# Patient Record
Sex: Female | Born: 1985 | Race: White | Hispanic: No | Marital: Single | Smoking: Current some day smoker
Health system: Southern US, Community
[De-identification: ages and names within clinical notes are randomized; demographics above are authoritative.]

## PROBLEM LIST (undated history)

## (undated) DIAGNOSIS — E039 Hypothyroidism, unspecified: Secondary | ICD-10-CM

## (undated) DIAGNOSIS — M069 Rheumatoid arthritis, unspecified: Secondary | ICD-10-CM

## (undated) DIAGNOSIS — O30049 Twin pregnancy, dichorionic/diamniotic, unspecified trimester: Secondary | ICD-10-CM

## (undated) HISTORY — DX: Hypothyroidism, unspecified: E03.9

## (undated) HISTORY — DX: Rheumatoid arthritis, unspecified: M06.9

## (undated) HISTORY — DX: Twin pregnancy, dichorionic/diamniotic, unspecified trimester: O30.049

## (undated) HISTORY — PX: NO PAST SURGERIES: SHX2092

---

## 2009-12-05 ENCOUNTER — Inpatient Hospital Stay (HOSPITAL_COMMUNITY): Admission: AD | Admit: 2009-12-05 | Discharge: 2009-12-07 | Payer: Self-pay | Admitting: Obstetrics and Gynecology

## 2010-06-18 LAB — CBC
HCT: 36.7 % (ref 36.0–46.0)
MCHC: 34.3 g/dL (ref 30.0–36.0)
MCV: 95.9 fL (ref 78.0–100.0)
Platelets: 188 10*3/uL (ref 150–400)
Platelets: 217 10*3/uL (ref 150–400)
RDW: 13.2 % (ref 11.5–15.5)
RDW: 13.4 % (ref 11.5–15.5)
WBC: 10.2 10*3/uL (ref 4.0–10.5)

## 2010-06-18 LAB — RPR: RPR Ser Ql: NONREACTIVE

## 2010-09-08 ENCOUNTER — Institutional Professional Consult (permissible substitution): Payer: Self-pay | Admitting: Medical

## 2011-01-09 ENCOUNTER — Emergency Department (HOSPITAL_COMMUNITY)
Admission: EM | Admit: 2011-01-09 | Discharge: 2011-01-09 | Disposition: A | Discharge status: Home / Self Care | Payer: PRIVATE HEALTH INSURANCE | Attending: Emergency Medicine | Admitting: Emergency Medicine

## 2011-01-09 DIAGNOSIS — M25519 Pain in unspecified shoulder: Secondary | ICD-10-CM | POA: Insufficient documentation

## 2011-04-20 ENCOUNTER — Ambulatory Visit (HOSPITAL_COMMUNITY)
Admission: RE | Admit: 2011-04-20 | Discharge: 2011-04-20 | Disposition: A | Discharge status: Home / Self Care | Payer: PRIVATE HEALTH INSURANCE | Source: Ambulatory Visit | Attending: Rheumatology | Admitting: Rheumatology

## 2011-04-20 ENCOUNTER — Other Ambulatory Visit: Payer: Self-pay | Admitting: Rheumatology

## 2011-04-20 DIAGNOSIS — M069 Rheumatoid arthritis, unspecified: Secondary | ICD-10-CM | POA: Insufficient documentation

## 2011-04-20 DIAGNOSIS — Z01818 Encounter for other preprocedural examination: Secondary | ICD-10-CM | POA: Insufficient documentation

## 2011-04-20 DIAGNOSIS — Z87891 Personal history of nicotine dependence: Secondary | ICD-10-CM | POA: Insufficient documentation

## 2011-04-20 DIAGNOSIS — D849 Immunodeficiency, unspecified: Secondary | ICD-10-CM

## 2011-04-20 DIAGNOSIS — R0602 Shortness of breath: Secondary | ICD-10-CM | POA: Insufficient documentation

## 2011-12-10 ENCOUNTER — Emergency Department (HOSPITAL_BASED_OUTPATIENT_CLINIC_OR_DEPARTMENT_OTHER): Payer: PRIVATE HEALTH INSURANCE

## 2011-12-10 ENCOUNTER — Emergency Department (HOSPITAL_BASED_OUTPATIENT_CLINIC_OR_DEPARTMENT_OTHER)
Admission: EM | Admit: 2011-12-10 | Discharge: 2011-12-11 | Disposition: A | Discharge status: Home / Self Care | Payer: PRIVATE HEALTH INSURANCE | Attending: Emergency Medicine | Admitting: Emergency Medicine

## 2011-12-10 ENCOUNTER — Encounter (HOSPITAL_BASED_OUTPATIENT_CLINIC_OR_DEPARTMENT_OTHER): Payer: Self-pay | Admitting: *Deleted

## 2011-12-10 DIAGNOSIS — R109 Unspecified abdominal pain: Secondary | ICD-10-CM | POA: Insufficient documentation

## 2011-12-10 DIAGNOSIS — R51 Headache: Secondary | ICD-10-CM | POA: Insufficient documentation

## 2011-12-10 DIAGNOSIS — M549 Dorsalgia, unspecified: Secondary | ICD-10-CM | POA: Insufficient documentation

## 2011-12-10 DIAGNOSIS — R110 Nausea: Secondary | ICD-10-CM | POA: Insufficient documentation

## 2011-12-10 DIAGNOSIS — F17200 Nicotine dependence, unspecified, uncomplicated: Secondary | ICD-10-CM | POA: Insufficient documentation

## 2011-12-10 DIAGNOSIS — Z79899 Other long term (current) drug therapy: Secondary | ICD-10-CM | POA: Insufficient documentation

## 2011-12-10 LAB — URINALYSIS, ROUTINE W REFLEX MICROSCOPIC
Glucose, UA: NEGATIVE mg/dL
Ketones, ur: 15 mg/dL — AB
Nitrite: NEGATIVE
Protein, ur: NEGATIVE mg/dL
Specific Gravity, Urine: 1.031 — ABNORMAL HIGH (ref 1.005–1.030)
Urobilinogen, UA: 1 mg/dL (ref 0.0–1.0)
pH: 5.5 (ref 5.0–8.0)

## 2011-12-10 LAB — CBC WITH DIFFERENTIAL/PLATELET
Eosinophils Relative: 0 % (ref 0–5)
HCT: 41.5 % (ref 36.0–46.0)
Lymphocytes Relative: 12 % (ref 12–46)
Lymphs Abs: 0.9 10*3/uL (ref 0.7–4.0)
MCV: 89.6 fL (ref 78.0–100.0)
Monocytes Absolute: 0.4 10*3/uL (ref 0.1–1.0)
Monocytes Relative: 5 % (ref 3–12)
RBC: 4.63 MIL/uL (ref 3.87–5.11)
RDW: 12.7 % (ref 11.5–15.5)
WBC: 7.6 10*3/uL (ref 4.0–10.5)

## 2011-12-10 LAB — URINE MICROSCOPIC-ADD ON

## 2011-12-10 LAB — COMPREHENSIVE METABOLIC PANEL
BUN: 15 mg/dL (ref 6–23)
CO2: 26 mEq/L (ref 19–32)
Calcium: 9.3 mg/dL (ref 8.4–10.5)
Creatinine, Ser: 0.8 mg/dL (ref 0.50–1.10)
GFR calc Af Amer: >90 mL/min (ref >90)
GFR calc non Af Amer: >90 mL/min (ref >90)
Glucose, Bld: 95 mg/dL (ref 70–99)

## 2011-12-10 LAB — PREGNANCY, URINE: Preg Test, Ur: NEGATIVE

## 2011-12-10 MED ORDER — ONDANSETRON HCL 4 MG/2ML IJ SOLN
4.0000 mg | Freq: Once | INTRAMUSCULAR | Status: AC
  Administered 2011-12-10: 4 mg via INTRAVENOUS
  Filled 2011-12-10: qty 2

## 2011-12-10 MED ORDER — MORPHINE SULFATE 4 MG/ML IJ SOLN
4.0000 mg | Freq: Once | INTRAMUSCULAR | Status: AC
  Administered 2011-12-11: 4 mg via INTRAVENOUS
  Filled 2011-12-10: qty 1

## 2011-12-10 MED ORDER — IOHEXOL 300 MG/ML  SOLN
40.0000 mL | Freq: Once | INTRAMUSCULAR | Status: AC | PRN
  Administered 2011-12-10: 40 mL via ORAL

## 2011-12-10 MED ORDER — SODIUM CHLORIDE 0.9 % IV BOLUS (SEPSIS)
1000.0000 mL | Freq: Once | INTRAVENOUS | Status: AC
  Administered 2011-12-10: 1000 mL via INTRAVENOUS

## 2011-12-10 MED ORDER — MORPHINE SULFATE 4 MG/ML IJ SOLN
4.0000 mg | Freq: Once | INTRAMUSCULAR | Status: AC
  Administered 2011-12-10: 4 mg via INTRAVENOUS
  Filled 2011-12-10: qty 1

## 2011-12-10 NOTE — ED Notes (Signed)
Pt c/o right flank pain  X 2 days

## 2011-12-10 NOTE — ED Provider Notes (Signed)
History     CSN: [id number]  Arrival date & time 12/10/11  Lori Valentine   First MD Initiated Contact with Patient 12/10/11 2239      Chief Complaint  Patient presents with  . Flank Pain    (Consider location/radiation/quality/duration/timing/severity/associated sxs/prior treatment) Patient is a 26 y.o. female presenting with flank pain. The history is provided by the patient, a parent and medical records.  Flank Pain Associated symptoms include abdominal pain, headaches and nausea. Pertinent negatives include no chest pain, diaphoresis, fatigue, fever, neck pain, rash or vomiting.   Lori Valentine is a 26 y.o. female presents to the emergency department complaining of flank pain, back pain, abdominal pain.  The onset of the symptoms was  abrupt starting 1 day ago.  The patient has associated nausea.  The symptoms have been  persistent, gradually worsened.  Breathing, coughing, moving makes the symptoms worse and nothing makes symptoms better.  The patient denies fever, chills, headache, cough, dysuria.  Pt with Hx of RA, no unusual straining or lifting.  Pain began in the R flank and moved throughout the back and abd.  Pain worse in the epigastric area.    History reviewed. No pertinent past medical history.  History reviewed. No pertinent past surgical history.  History reviewed. No pertinent family history.  History  Substance Use Topics  . Smoking status: Current Some Day Smoker  . Smokeless tobacco: Not on file  . Alcohol Use: No    OB History    Grav Para Term Preterm Abortions TAB SAB Ect Mult Living                  Review of Systems  Constitutional: Negative for fever, diaphoresis, appetite change and fatigue.  HENT: Negative for trouble swallowing, neck pain and neck stiffness.   Respiratory: Negative for shortness of breath.   Cardiovascular: Negative for chest pain.  Gastrointestinal: Positive for nausea and abdominal pain. Negative for vomiting, diarrhea, constipation  and blood in stool.  Genitourinary: Positive for flank pain. Negative for dysuria, urgency, frequency, hematuria and difficulty urinating.  Musculoskeletal: Positive for back pain.  Skin: Negative for rash.  Neurological: Positive for headaches.  Psychiatric/Behavioral: The patient is not nervous/anxious.   All other systems reviewed and are negative.    Allergies  Review of patient's allergies indicates no known allergies.  Home Medications   Current Outpatient Rx  Name Route Sig Dispense Refill  . ACETAMINOPHEN 500 MG PO TABS Oral Take 1,000 mg by mouth every 6 (six) hours as needed. For pain.    Marland Kitchen ETANERCEPT 50 MG/ML [place] SOLN Subcutaneous Inject 50 mg into the skin once a week.    Marland Kitchen LEVONORGESTREL 20 MCG/24HR IU IUD Intrauterine 1 each by Intrauterine route once.    . METHOTREXATE 2.5 MG PO TABS Oral Take 2.5 mg by mouth once a week. Caution:Chemotherapy. Protect from light.    Marland Kitchen PREDNISOLONE SODIUM PHOSPHATE 15 MG PO TBDP Oral Take 15 mg by mouth daily.    . TRAMADOL HCL 50 MG PO TABS Oral Take 50 mg by mouth every 6 (six) hours as needed.      BP 124/85  Pulse 100  Temp 98.1 F (36.7 C) (Oral)  Resp 16  Ht 5\' 7"  (1.702 m)  Wt 235 lb (106.595 kg)  BMI 36.81 kg/m2  SpO2 100%  Physical Exam  Nursing note and vitals reviewed. Constitutional: She appears well-developed and well-nourished. No distress.  HENT:  Head: Normocephalic and atraumatic.  Mouth/Throat: Oropharynx is  clear and moist. No oropharyngeal exudate.  Eyes: Conjunctivae are normal. No scleral icterus.  Neck: Normal range of motion. Neck supple.  Cardiovascular: Normal rate, regular rhythm and intact distal pulses.   Pulmonary/Chest: Effort normal and breath sounds normal. No respiratory distress. She has no wheezes.  Abdominal: Soft. Bowel sounds are normal. She exhibits no mass. There is tenderness in the epigastric area and periumbilical area. There is guarding and CVA tenderness (R side). There is no  rebound.  Musculoskeletal: Normal range of motion. She exhibits no edema.  Neurological: She is alert. She exhibits normal muscle tone. Coordination normal.       Speech is clear and goal oriented Moves extremities without ataxia  Skin: Skin is warm and dry. No rash noted. She is not diaphoretic.  Psychiatric: She has a normal mood and affect.    ED Course  Procedures (including critical care time)  Labs Reviewed  URINALYSIS, ROUTINE W REFLEX MICROSCOPIC - Abnormal; Notable for the following:    Color, Urine AMBER (*)  BIOCHEMICALS MAY BE AFFECTED BY COLOR   Specific Gravity, Urine 1.031 (*)     Hgb urine dipstick MODERATE (*)     Bilirubin Urine SMALL (*)     Ketones, ur 15 (*)     Leukocytes, UA SMALL (*)     All other components within normal limits  URINE MICROSCOPIC-ADD ON - Abnormal; Notable for the following:    Squamous Epithelial / LPF FEW (*)     Bacteria, UA FEW (*)     All other components within normal limits  CBC WITH DIFFERENTIAL - Abnormal; Notable for the following:    Neutrophils Relative 83 (*)     All other components within normal limits  PREGNANCY, URINE  COMPREHENSIVE METABOLIC PANEL  LIPASE, BLOOD   No results found.  Results for orders placed during the hospital encounter of 12/10/11  PREGNANCY, URINE      Component Value Range   Preg Test, Ur NEGATIVE  NEGATIVE  URINALYSIS, ROUTINE W REFLEX MICROSCOPIC      Component Value Range   Color, Urine AMBER (*) YELLOW   APPearance CLEAR  CLEAR   Specific Gravity, Urine 1.031 (*) 1.005 - 1.030   pH 5.5  5.0 - 8.0   Glucose, UA NEGATIVE  NEGATIVE mg/dL   Hgb urine dipstick MODERATE (*) NEGATIVE   Bilirubin Urine SMALL (*) NEGATIVE   Ketones, ur 15 (*) NEGATIVE mg/dL   Protein, ur NEGATIVE  NEGATIVE mg/dL   Urobilinogen, UA 1.0  0.0 - 1.0 mg/dL   Nitrite NEGATIVE  NEGATIVE   Leukocytes, UA SMALL (*) NEGATIVE  URINE MICROSCOPIC-ADD ON      Component Value Range   Squamous Epithelial / LPF FEW (*)  RARE   WBC, UA 3-6  <3 WBC/hpf   RBC / HPF 7-10  <3 RBC/hpf   Bacteria, UA FEW (*) RARE  CBC WITH DIFFERENTIAL      Component Value Range   WBC 7.6  4.0 - 10.5 K/uL   RBC 4.63  3.87 - 5.11 MIL/uL   Hemoglobin 14.3  12.0 - 15.0 g/dL   HCT 16.1  09.6 - 04.5 %   MCV 89.6  78.0 - 100.0 fL   MCH 30.9  26.0 - 34.0 pg   MCHC 34.5  30.0 - 36.0 g/dL   RDW 40.9  81.1 - 91.4 %   Platelets 254  150 - 400 K/uL   Neutrophils Relative 83 (*) 43 - 77 %  Neutro Abs 6.3  1.7 - 7.7 K/uL   Lymphocytes Relative 12  12 - 46 %   Lymphs Abs 0.9  0.7 - 4.0 K/uL   Monocytes Relative 5  3 - 12 %   Monocytes Absolute 0.4  0.1 - 1.0 K/uL   Eosinophils Relative 0  0 - 5 %   Eosinophils Absolute 0.0  0.0 - 0.7 K/uL   Basophils Relative 0  0 - 1 %   Basophils Absolute 0.0  0.0 - 0.1 K/uL  COMPREHENSIVE METABOLIC PANEL      Component Value Range   Sodium 135  135 - 145 mEq/L   Potassium 3.5  3.5 - 5.1 mEq/L   Chloride 97  96 - 112 mEq/L   CO2 26  19 - 32 mEq/L   Glucose, Bld 95  70 - 99 mg/dL   BUN 15  6 - 23 mg/dL   Creatinine, Ser 1.61  0.50 - 1.10 mg/dL   Calcium 9.3  8.4 - 09.6 mg/dL   Total Protein 7.5  6.0 - 8.3 g/dL   Albumin 4.5  3.5 - 5.2 g/dL   AST 20  0 - 37 U/L   ALT 19  0 - 35 U/L   Alkaline Phosphatase 73  39 - 117 U/L   Total Bilirubin 0.6  0.3 - 1.2 mg/dL   GFR calc non Af Amer >90  >90 mL/min   GFR calc Af Amer >90  >90 mL/min  LIPASE, BLOOD      Component Value Range   Lipase 27  11 - 59 U/L   No results found.    1. Abdominal pain       MDM  Lori Valentine presents with right flank pain, epigastric and periumbilical pain.  Patient without a history of kidney stones.  Concern for kidney stone versus appendicitis versus pancreatitis.  CMP, CBC, lipase within normal limits.  UA appears contaminated with epithelial cells, but does have a moderate amount of hemoglobin and small amount leukocytes.  Patient is alert, nontoxic appearing, nonseptic appearing, afebrile.  Pain  medication and fluids given in the emergency department. Adequate pain control achieved.  Abdominal CT exam pending.  I discussed this patient with Dr. Read Drivers and he will assume care.          Dahlia Client Devaney Segers, PA-C 12/11/11 1432

## 2011-12-10 NOTE — ED Notes (Signed)
Pt c/o stabbing pains in her back, started this am, pain has gotten worse as the day as progressed reports chest pressure to left side of chest with sob,

## 2011-12-11 MED ORDER — IOHEXOL 300 MG/ML  SOLN
100.0000 mL | Freq: Once | INTRAMUSCULAR | Status: AC | PRN
  Administered 2011-12-11: 100 mL via INTRAVENOUS

## 2011-12-11 MED ORDER — HYDROCODONE-ACETAMINOPHEN 5-325 MG PO TABS: 1.0000 | ORAL_TABLET | Freq: Four times a day (QID) | ORAL | Status: DC | PRN

## 2011-12-11 NOTE — ED Provider Notes (Signed)
Nursing notes and vitals signs, including pulse oximetry, reviewed.  Summary of this visit's results, reviewed by myself:  Labs:  Results for orders placed during the hospital encounter of 12/10/11  PREGNANCY, URINE      Component Value Range   Preg Test, Ur NEGATIVE  NEGATIVE  URINALYSIS, ROUTINE W REFLEX MICROSCOPIC      Component Value Range   Color, Urine AMBER (*) YELLOW   APPearance CLEAR  CLEAR   Specific Gravity, Urine 1.031 (*) 1.005 - 1.030   pH 5.5  5.0 - 8.0   Glucose, UA NEGATIVE  NEGATIVE mg/dL   Hgb urine dipstick MODERATE (*) NEGATIVE   Bilirubin Urine SMALL (*) NEGATIVE   Ketones, ur 15 (*) NEGATIVE mg/dL   Protein, ur NEGATIVE  NEGATIVE mg/dL   Urobilinogen, UA 1.0  0.0 - 1.0 mg/dL   Nitrite NEGATIVE  NEGATIVE   Leukocytes, UA SMALL (*) NEGATIVE  URINE MICROSCOPIC-ADD ON      Component Value Range   Squamous Epithelial / LPF FEW (*) RARE   WBC, UA 3-6  <3 WBC/hpf   RBC / HPF 7-10  <3 RBC/hpf   Bacteria, UA FEW (*) RARE  CBC WITH DIFFERENTIAL      Component Value Range   WBC 7.6  4.0 - 10.5 K/uL   RBC 4.63  3.87 - 5.11 MIL/uL   Hemoglobin 14.3  12.0 - 15.0 g/dL   HCT 13.0  86.5 - 78.4 %   MCV 89.6  78.0 - 100.0 fL   MCH 30.9  26.0 - 34.0 pg   MCHC 34.5  30.0 - 36.0 g/dL   RDW 69.6  29.5 - 28.4 %   Platelets 254  150 - 400 K/uL   Neutrophils Relative 83 (*) 43 - 77 %   Neutro Abs 6.3  1.7 - 7.7 K/uL   Lymphocytes Relative 12  12 - 46 %   Lymphs Abs 0.9  0.7 - 4.0 K/uL   Monocytes Relative 5  3 - 12 %   Monocytes Absolute 0.4  0.1 - 1.0 K/uL   Eosinophils Relative 0  0 - 5 %   Eosinophils Absolute 0.0  0.0 - 0.7 K/uL   Basophils Relative 0  0 - 1 %   Basophils Absolute 0.0  0.0 - 0.1 K/uL  COMPREHENSIVE METABOLIC PANEL      Component Value Range   Sodium 135  135 - 145 mEq/L   Potassium 3.5  3.5 - 5.1 mEq/L   Chloride 97  96 - 112 mEq/L   CO2 26  19 - 32 mEq/L   Glucose, Bld 95  70 - 99 mg/dL   BUN 15  6 - 23 mg/dL   Creatinine, Ser 1.32  0.50  - 1.10 mg/dL   Calcium 9.3  8.4 - 44.0 mg/dL   Total Protein 7.5  6.0 - 8.3 g/dL   Albumin 4.5  3.5 - 5.2 g/dL   AST 20  0 - 37 U/L   ALT 19  0 - 35 U/L   Alkaline Phosphatase 73  39 - 117 U/L   Total Bilirubin 0.6  0.3 - 1.2 mg/dL   GFR calc non Af Amer >90  >90 mL/min   GFR calc Af Amer >90  >90 mL/min  LIPASE, BLOOD      Component Value Range   Lipase 27  11 - 59 U/L    Imaging Studies: Dg Chest 2 View  12/11/2011  *RADIOLOGY REPORT*  Clinical Data: Left-sided chest and  back pain.  Shortness of breath.  CHEST - 2 VIEW  Comparison:  04/10/2011  Findings:  The heart size and mediastinal contours are within normal limits.  Both lungs are clear.  The visualized skeletal structures are unremarkable.  IMPRESSION: No active cardiopulmonary disease.   Original Report Authenticated By: Danae Orleans, M.D.    Ct Abdomen Pelvis W Contrast  12/11/2011  *RADIOLOGY REPORT*  Clinical Data: Right flank and lower quadrant pain.  CT ABDOMEN AND PELVIS WITH CONTRAST  Technique:  Multidetector CT imaging of the abdomen and pelvis was performed following the standard protocol during bolus administration of intravenous contrast.  Contrast: OMNIPAQUE IOHEXOL 300 MG/ML  SOLN  Comparison: None.  Findings: The abdominal parenchymal organs are normal in appearance.  Gallbladder is unremarkable.  No evidence of hydronephrosis. No evidence of urolithiasis.  No soft tissue masses or lymphadenopathy identified within the abdomen or pelvis.  IUD is seen with the expected location in the uterus.  Adnexa are unremarkable.  Normal appendix is visualized.  No evidence of inflammatory process or abnormal fluid collections.  No evidence of dilated bowel loops.  IMPRESSION: Negative.  No acute findings or other significant abnormality identified.   Original Report Authenticated By: Danae Orleans, M.D.    Medical screening examination/treatment/procedure(s) were conducted as a shared visit with non-physician practitioner(s)  and myself.  I personally evaluated the patient during the encounter  1:00 AM Pain is improved. Some left upper quadrant tenderness remains. Abdomen is soft. Patient was advised to return if worsening and she is on immunosuppressive drugs.  Hanley Seamen, MD 12/11/11 [id number]

## 2011-12-13 ENCOUNTER — Encounter (HOSPITAL_COMMUNITY): Payer: Self-pay | Admitting: Family Medicine

## 2011-12-13 ENCOUNTER — Emergency Department (HOSPITAL_COMMUNITY)
Admission: EM | Admit: 2011-12-13 | Discharge: 2011-12-14 | Disposition: A | Discharge status: Home / Self Care | Payer: PRIVATE HEALTH INSURANCE | Attending: Emergency Medicine | Admitting: Emergency Medicine

## 2011-12-13 ENCOUNTER — Emergency Department (HOSPITAL_COMMUNITY): Payer: PRIVATE HEALTH INSURANCE

## 2011-12-13 DIAGNOSIS — M7918 Myalgia, other site: Secondary | ICD-10-CM

## 2011-12-13 DIAGNOSIS — R079 Chest pain, unspecified: Secondary | ICD-10-CM | POA: Insufficient documentation

## 2011-12-13 DIAGNOSIS — M549 Dorsalgia, unspecified: Secondary | ICD-10-CM | POA: Insufficient documentation

## 2011-12-13 DIAGNOSIS — R0602 Shortness of breath: Secondary | ICD-10-CM | POA: Insufficient documentation

## 2011-12-13 DIAGNOSIS — R791 Abnormal coagulation profile: Secondary | ICD-10-CM | POA: Insufficient documentation

## 2011-12-13 DIAGNOSIS — Z79899 Other long term (current) drug therapy: Secondary | ICD-10-CM | POA: Insufficient documentation

## 2011-12-13 DIAGNOSIS — F17200 Nicotine dependence, unspecified, uncomplicated: Secondary | ICD-10-CM | POA: Insufficient documentation

## 2011-12-13 DIAGNOSIS — R109 Unspecified abdominal pain: Secondary | ICD-10-CM | POA: Insufficient documentation

## 2011-12-13 DIAGNOSIS — IMO0001 Reserved for inherently not codable concepts without codable children: Secondary | ICD-10-CM | POA: Insufficient documentation

## 2011-12-13 LAB — POCT I-STAT, CHEM 8
BUN: 8 mg/dL (ref 6–23)
Calcium, Ion: 1.17 mmol/L (ref 1.12–1.23)
Chloride: 106 mEq/L (ref 96–112)
Creatinine, Ser: 0.7 mg/dL (ref 0.50–1.10)
Glucose, Bld: 126 mg/dL — ABNORMAL HIGH (ref 70–99)
TCO2: 22 mmol/L (ref 0–100)

## 2011-12-13 LAB — D-DIMER, QUANTITATIVE: D-Dimer, Quant: 2.76 ug/mL-FEU — ABNORMAL HIGH (ref 0.00–0.48)

## 2011-12-13 MED ORDER — MORPHINE SULFATE 4 MG/ML IJ SOLN
4.0000 mg | Freq: Once | INTRAMUSCULAR | Status: AC
  Administered 2011-12-13: 4 mg via INTRAVENOUS
  Filled 2011-12-13: qty 1

## 2011-12-13 MED ORDER — METHOCARBAMOL 500 MG PO TABS: 500.0000 mg | ORAL_TABLET | Freq: Two times a day (BID) | ORAL | Status: AC

## 2011-12-13 MED ORDER — IOHEXOL 350 MG/ML SOLN
100.0000 mL | Freq: Once | INTRAVENOUS | Status: AC | PRN
  Administered 2011-12-13: 100 mL via INTRAVENOUS

## 2011-12-13 MED ORDER — IBUPROFEN 400 MG PO TABS: 800.0000 mg | ORAL_TABLET | Freq: Once | ORAL | Status: DC

## 2011-12-13 MED ORDER — METHOCARBAMOL 500 MG PO TABS
1000.0000 mg | ORAL_TABLET | Freq: Once | ORAL | Status: AC
  Administered 2011-12-13: 1000 mg via ORAL
  Filled 2011-12-13: qty 2

## 2011-12-13 NOTE — ED Provider Notes (Signed)
History    This chart was scribed for Loren Racer, MD, MD by Smitty Pluck. The patient was seen in room TR05C and the patient's care was started at 9:03AM.   CSN: [id number]  Arrival date & time 12/13/11  0855   None     Chief Complaint  Patient presents with  . Back Pain    (Consider location/radiation/quality/duration/timing/severity/associated sxs/prior treatment) Patient is a 26 y.o. female presenting with back pain. The history is provided by the patient. No language interpreter was used.  Back Pain  Pertinent negatives include no chest pain, no fever, no numbness, no headaches, no abdominal pain, no dysuria and no weakness.   Lori Valentine is a 26 y.o. female who presents to the Emergency Department complaining of ongoing moderate right flank pain onset 3 days ago. Pt reports carrying a basket of laundry on Thursday. Reports SOB is only associated with pain. Pt [facility] days ago with normal CT abdomen pelvis, CXR, labs, UA, neg preg and was prescribed Vicodin without relief. Pt has hx of RA and takes methotrexate and prednisone. Pain improved yesterday and worsened today. No fever, chills, cough, chest pain, abd pain, N/V/D, urinary symptoms. Pain is worse with movement, deep breathing and talking.   History reviewed. No pertinent past medical history.  History reviewed. No pertinent past surgical history.  No family history on file.  History  Substance Use Topics  . Smoking status: Current Some Day Smoker  . Smokeless tobacco: Not on file  . Alcohol Use: No    OB History    Grav Para Term Preterm Abortions TAB SAB Ect Mult Living                  Review of Systems  Constitutional: Negative for fever and chills.  Respiratory: Positive for shortness of breath. Negative for cough, chest tightness and wheezing.   Cardiovascular: Negative for chest pain, palpitations and leg swelling.  Gastrointestinal: Negative for nausea, vomiting, abdominal pain and  diarrhea.  Genitourinary: Positive for flank pain. Negative for dysuria, frequency and hematuria.  Musculoskeletal: Positive for myalgias and back pain. Negative for joint swelling, arthralgias and gait problem.  Skin: Negative for rash and wound.  Neurological: Negative for dizziness, weakness, light-headedness, numbness and headaches.    Allergies  Review of patient's allergies indicates no known allergies.  Home Medications   Current Outpatient Rx  Name Route Sig Dispense Refill  . ACETAMINOPHEN 500 MG PO TABS Oral Take 1,000 mg by mouth every 6 (six) hours as needed. For pain.    Marland Kitchen ETANERCEPT 50 MG/ML [place] SOLN Subcutaneous Inject 50 mg into the skin once a week.    Marland Kitchen FOLIC ACID 1 MG PO TABS Oral Take 1 mg by mouth daily.    Marland Kitchen HYDROCODONE-ACETAMINOPHEN 5-325 MG PO TABS Oral Take 1-2 tablets by mouth every 6 (six) hours as needed. pain    . LEVOTHYROXINE SODIUM 75 MCG PO TABS Oral Take 75 mcg by mouth daily.    Marland Kitchen METHOTREXATE 2.5 MG PO TABS Oral Take 15 mg by mouth once a week. Caution:Chemotherapy. Protect from light.    Marland Kitchen PREDNISOLONE SODIUM PHOSPHATE 15 MG PO TBDP Oral Take 15 mg by mouth daily.    Marland Kitchen LEVONORGESTREL 20 MCG/24HR IU IUD Intrauterine 1 each by Intrauterine route once.    . METHOCARBAMOL 500 MG PO TABS Oral Take 1 tablet (500 mg total) by mouth 2 (two) times daily. 20 tablet 0    BP 114/98  Pulse 110  Temp 98.4 F (36.9 C)  Resp 20  SpO2 98%  Physical Exam  Nursing note and vitals reviewed. Constitutional: She is oriented to person, place, and time. She appears well-developed and well-nourished. No distress.       Walks into room with little difficulty and began crying during questioning.   HENT:  Head: Normocephalic and atraumatic.  Mouth/Throat: Oropharynx is clear and moist.  Eyes: EOM are normal. Pupils are equal, round, and reactive to light.  Neck: Normal range of motion. Neck supple.  Cardiovascular: Normal rate, regular rhythm and normal heart sounds.    Pulmonary/Chest: Effort normal and breath sounds normal. No respiratory distress. She has no wheezes. She has no rales. She exhibits no tenderness.  Abdominal: Soft. Bowel sounds are normal. There is no tenderness. There is no rebound and no guarding.       Pain is pinpointed to right flank and partially reproduced with palpation  Musculoskeletal: Normal range of motion. She exhibits tenderness. She exhibits no edema.       No calf tenderness or swelling  Ambulating without difficulty but became tearful   Mild TTP over R low thoracic back. Pin point tenderness.   Neurological: She is alert and oriented to person, place, and time.       5/5 motor, sensation intact  Skin: Skin is warm and dry. No rash noted. No erythema.  Psychiatric:       Tearful    ED Course  Procedures (including critical care time) DIAGNOSTIC STUDIES: Oxygen Saturation is 98% on room air, normal by my interpretation.    COORDINATION OF CARE: 9:17 AM Discussed pt ED treatment with pt   9:20 AM Ordered:   Medications  folic acid (FOLVITE) 1 MG tablet (not administered)  levothyroxine (SYNTHROID, LEVOTHROID) 75 MCG tablet (not administered)  HYDROcodone-acetaminophen (NORCO/VICODIN) 5-325 MG per tablet (not administered)  methocarbamol (ROBAXIN) 500 MG tablet (not administered)  methocarbamol (ROBAXIN) tablet 1,000 mg (1000 mg Oral Given 12/13/11 0938)  morphine 4 MG/ML injection 4 mg (4 mg Intravenous Given 12/13/11 1129)  iohexol (OMNIPAQUE) 350 MG/ML injection 100 mL (100 mL Intravenous Contrast Given 12/13/11 1210)       Labs Reviewed  D-DIMER, QUANTITATIVE - Abnormal; Notable for the following:    D-Dimer, Quant 2.76 (*)     All other components within normal limits  POCT I-STAT, CHEM 8 - Abnormal; Notable for the following:    Glucose, Bld 126 (*)     Hemoglobin 15.6 (*)     All other components within normal limits   Ct Angio Chest W/cm &/or Wo Cm  12/13/2011  *RADIOLOGY REPORT*  Clinical Data: Right  chest and back pain.  Elevated D-dimer.  CT ANGIOGRAPHY CHEST  Technique:  Multidetector CT imaging of the chest using the standard protocol during bolus administration of intravenous contrast. Multiplanar reconstructed images including MIPs were obtained and reviewed to evaluate the vascular anatomy.  Contrast: OMNIPAQUE IOHEXOL 350 MG/ML SOLN  Comparison: Chest x-ray 12/10/2011  Findings: No filling defects in the pulmonary arteries to suggest pulmonary emboli.  Plate-like and linear densities are noted dependently in the lower lobes bilaterally compatible with atelectasis.  No pleural effusions.  Heart is normal size.  Aorta is normal caliber. No mediastinal, hilar, or axillary adenopathy.  No acute bony abnormality. Imaging into the upper abdomen shows no acute findings.  IMPRESSION: Dependent atelectasis in the lungs bilaterally.  No visible pulmonary embolus.   Original Report Authenticated By: Cyndie Chime,  M.D.      1. Musculoskeletal pain       MDM  I personally performed the services described in this documentation, which was scribed in my presence. The recorded information has been reviewed and considered.  Reviewed pt's prev testing and CT. No source for pt symptoms found. Normal GB, lower lung, and kidneys without stones. Will screen for PE though I believe this is unlikely and symptoms are more likely musculoskeletal or pleurisy       Loren Racer, MD 12/13/11 1258

## 2011-12-13 NOTE — ED Notes (Signed)
Pt complaining of upper back pain sharp, stabbing and hurts to take a deep breath. Pt crying.

## 2011-12-13 NOTE — ED Notes (Addendum)
ED tech went to undress pt and have wanded in error, pt and family at bedside became very upset. Explained it was in error and wrong room number was told to tech.  Pt assisted back on stretcher and clothing not removed.

## 2011-12-13 NOTE — ED Notes (Signed)
Pt here for right flank pain. Onset last week, seen for same at [facility]. sts got better then pain returned, pt complains of pain with deep breath after talking, and when she is upset.

## 2011-12-13 NOTE — ED Notes (Signed)
Pt family is upset with MD sts that he was rude on assessment, family then sts that " he will receive a mouthful and that Reinaldo Berber would receive a phone call regarding same" Dr Ranae Palms went back into room with this rn and explained that he was just being direct at trying to get symptoms from pt and that he needed more than for " to just hand me the paper and say this is whats wrong" MD explained that he is testing her for furthur problems and rechecking also compared to what was done at [facility].

## 2013-03-16 ENCOUNTER — Other Ambulatory Visit: Payer: Self-pay | Admitting: Obstetrics & Gynecology

## 2013-03-16 ENCOUNTER — Other Ambulatory Visit (HOSPITAL_COMMUNITY)
Admission: RE | Admit: 2013-03-16 | Discharge: 2013-03-16 | Disposition: A | Discharge status: Home / Self Care | Payer: PRIVATE HEALTH INSURANCE | Source: Ambulatory Visit | Attending: Obstetrics & Gynecology | Admitting: Obstetrics & Gynecology

## 2013-03-16 DIAGNOSIS — N760 Acute vaginitis: Secondary | ICD-10-CM | POA: Insufficient documentation

## 2013-03-16 DIAGNOSIS — Z01419 Encounter for gynecological examination (general) (routine) without abnormal findings: Secondary | ICD-10-CM | POA: Insufficient documentation

## 2014-07-25 ENCOUNTER — Telehealth: Payer: Self-pay | Admitting: *Deleted

## 2014-07-25 NOTE — Telephone Encounter (Signed)
Unable to reach patient at time of Pre-Visit Call.  Left message for patient to return call when available.    

## 2014-07-29 ENCOUNTER — Encounter: Payer: Self-pay | Admitting: Family

## 2014-07-29 ENCOUNTER — Ambulatory Visit (INDEPENDENT_AMBULATORY_CARE_PROVIDER_SITE_OTHER): Payer: Managed Care, Other (non HMO) | Admitting: Family

## 2014-07-29 VITALS — BP 128/78 | HR 84 | Temp 98.1°F | Resp 14 | Ht 68.0 in | Wt 252.2 lb

## 2014-07-29 DIAGNOSIS — F418 Other specified anxiety disorders: Secondary | ICD-10-CM

## 2014-07-29 DIAGNOSIS — F32A Depression, unspecified: Secondary | ICD-10-CM | POA: Insufficient documentation

## 2014-07-29 DIAGNOSIS — F419 Anxiety disorder, unspecified: Secondary | ICD-10-CM

## 2014-07-29 DIAGNOSIS — E039 Hypothyroidism, unspecified: Secondary | ICD-10-CM | POA: Insufficient documentation

## 2014-07-29 DIAGNOSIS — F329 Major depressive disorder, single episode, unspecified: Secondary | ICD-10-CM

## 2014-07-29 LAB — TSH: TSH: 37.2 u[IU]/mL — ABNORMAL HIGH (ref 0.35–4.50)

## 2014-07-29 LAB — T3, FREE: T3, Free: 2.9 pg/mL (ref 2.3–4.2)

## 2014-07-29 LAB — T4, FREE: FREE T4: 0.5 ng/dL — AB (ref 0.60–1.60)

## 2014-07-29 MED ORDER — VENLAFAXINE HCL ER 37.5 MG PO CP24: ORAL_CAPSULE | ORAL | Status: DC

## 2014-07-29 MED ORDER — LEVOTHYROXINE SODIUM 75 MCG PO TABS: 75.0000 ug | ORAL_TABLET | Freq: Every day | ORAL | Status: DC

## 2014-07-29 NOTE — Progress Notes (Signed)
Subjective:    Patient ID: Lori Valentine, female    DOB: [birth year], 29 y.o.   MRN: [id number]  HPI  [name] is a 29 yr old female who presents today to establish care.  1) RA- maintained on Embrel, methotrexate. Reports taht she was diagnosed at 40.  Reports that she does not want to take injections. Dreads the injections.  She sees [name].  Has an appointment with another rheumatologist in [place].    Sadness- x 6 months.  Easily tearful, trouble handling things.    2) Hypothyroid- Has not been taking her medication.  Notes some trouble focusing, gets easily overwhelmed.  Can't follow through.    Review of Systems    see HPI  Past Medical History  Diagnosis Date  . Rheumatoid arthritis   . Hypothyroid     History   Social History  . Marital Status: Single    Spouse Name: N/A  . Number of Children: N/A  . Years of Education: N/A   Occupational History  . Not on file.   Social History Main Topics  . Smoking status: Current Every Day Smoker -- 10 years  . Smokeless tobacco: Not on file     Comment: 10 cigarettes daily  . Alcohol Use: 0.0 oz/week    0 Standard drinks or equivalent per week     Comment: rarely  . Drug Use: No  . Sexual Activity: Yes    Birth Control/ Protection:    Other Topics Concern  . Not on file   Social History Narrative   41 year old son   Lives with  Sister and nephew and son.   Works as Sales executive at [employer]       History reviewed. No pertinent past surgical history.  Family History  Problem Relation Age of Onset  . Schizophrenia Mother   . Bipolar disorder Mother   . Diabetes Other   . Heart disease Other     No Known Allergies  Current Outpatient Prescriptions on File Prior to Visit  Medication Sig Dispense Refill  . acetaminophen (TYLENOL) 500 MG tablet Take 1,000 mg by mouth every 6 (six) hours as needed. For pain.    Marland Kitchen etanercept (ENBREL) 50 MG/ML injection Inject 50 mg into the skin once a week.      . folic acid (FOLVITE) 1 MG tablet Take 2 mg by mouth daily.     Marland Kitchen levonorgestrel (MIRENA) 20 MCG/24HR IUD 1 each by Intrauterine route once.     No current facility-administered medications on file prior to visit.    BP 128/78 mmHg  Pulse 84  Temp(Src) 98.1 F (36.7 C) (Oral)  Resp 14  Ht 5\' 8"  (1.727 m)  Wt 252 lb 3.2 oz (114.397 kg)  BMI 38.36 kg/m2  SpO2 99%  LMP 07/29/2014    Objective:   Physical Exam  Constitutional: She is oriented to person, place, and time. She appears well-developed and well-nourished. No distress.  Cardiovascular: Normal rate and regular rhythm.   No murmur heard. Pulmonary/Chest: Effort normal and breath sounds normal. No respiratory distress. She has no wheezes. She has no rales. She exhibits no tenderness.  Neurological: She is alert and oriented to person, place, and time.  Psychiatric: Her behavior is normal. Judgment and thought content normal.  Tearful through much of interview          Assessment & Plan:  20 minutes spent with pt today. >50% of this time was spent counseling pt  on anxiety/depression.

## 2014-07-29 NOTE — Assessment & Plan Note (Signed)
Untreated, obtain follow up tfts.  Resume synthroid.

## 2014-07-29 NOTE — Patient Instructions (Signed)
Please complete lab work prior to leaving. Restart synthroid. Start effexor xr one tab once daily for 3 days, then increase to 2 tabs once daily. Follow up in 1 month. Welcome to [facility]!

## 2014-07-29 NOTE — Progress Notes (Signed)
Pre visit review using our clinic review tool, if applicable. No additional management support is needed unless otherwise documented below in the visit note. 

## 2014-07-29 NOTE — Assessment & Plan Note (Signed)
New. She is concerned about weight gain, so will try effexor rather than SSRI.  Advised pt to follow up in 1 month.

## 2014-07-30 ENCOUNTER — Encounter: Payer: Self-pay | Admitting: Family

## 2014-07-31 ENCOUNTER — Telehealth: Payer: Self-pay | Admitting: Family

## 2014-07-31 NOTE — Telephone Encounter (Signed)
Relation to pt: self  Call back number: work # [phone]    Reason for call:  Pt returning your call regarding lab results, pt states whomever answers the phone please let them know  its the doctor office.

## 2014-07-31 NOTE — Telephone Encounter (Signed)
Notified pt and she voices understanding. 

## 2014-08-07 ENCOUNTER — Telehealth: Payer: Self-pay | Admitting: Family

## 2014-08-07 NOTE — Telephone Encounter (Signed)
12-20% of pts can experience drowsiness on effexor. I would recommend that she continue as tolerated at the 37.5mg  dose. Don't increase to 2 tabs yet. Hopefully symptoms will improve over the next few weeks.

## 2014-08-07 NOTE — Telephone Encounter (Signed)
Caller name: Persephone Relation to pt: self Call back number: [phone] Pharmacy:  Reason for call:   Patient states that she feels extremely exhausted and wants to know if that is a side effect of effexor?

## 2014-08-09 NOTE — Telephone Encounter (Signed)
Left message on pt's voicemail to check her mychart message as response was sent to her via mychart.

## 2014-08-30 ENCOUNTER — Ambulatory Visit: Payer: PRIVATE HEALTH INSURANCE | Admitting: Family

## 2014-08-30 ENCOUNTER — Encounter: Payer: Self-pay | Admitting: Family

## 2014-08-30 DIAGNOSIS — Z0289 Encounter for other administrative examinations: Secondary | ICD-10-CM

## 2014-08-30 NOTE — Telephone Encounter (Signed)
Melissa-- please advise if ok to waive the late cancellation fee?

## 2014-09-03 ENCOUNTER — Ambulatory Visit: Payer: PRIVATE HEALTH INSURANCE | Admitting: Family

## 2014-09-03 DIAGNOSIS — Z0289 Encounter for other administrative examinations: Secondary | ICD-10-CM

## 2014-09-06 ENCOUNTER — Telehealth: Payer: Self-pay | Admitting: Family

## 2014-09-06 ENCOUNTER — Encounter: Payer: Self-pay | Admitting: Family

## 2014-09-06 NOTE — Telephone Encounter (Signed)
Pt was no show for appt on 08/30/14- letter sent. Charge?

## 2014-09-06 NOTE — Telephone Encounter (Signed)
Yes please

## 2014-09-10 ENCOUNTER — Encounter: Payer: Self-pay | Admitting: Family

## 2014-09-10 ENCOUNTER — Telehealth: Payer: Self-pay | Admitting: Family

## 2014-09-10 NOTE — Telephone Encounter (Signed)
Yes please

## 2014-09-10 NOTE — Telephone Encounter (Signed)
Pt was no show for appointment on 5/31- was also no show for appointment on 5/27. Letter sent- charge?

## 2014-09-13 NOTE — Telephone Encounter (Signed)
Mary- see below. Please remove charge. Thanks.

## 2014-09-13 NOTE — Telephone Encounter (Signed)
Lori Valentine-- It appears that pt may not have been aware of appt on 09/03/14.  See 08/30/14 patient email.  I received system message that 08/30/14 mychart response was not read. Do we still apply no show charge for 09/03/14?

## 2015-05-12 ENCOUNTER — Telehealth: Payer: Self-pay | Admitting: Family

## 2015-05-12 NOTE — Telephone Encounter (Signed)
Noted  

## 2015-05-12 NOTE — Telephone Encounter (Signed)
Pt called in because she is experiencing some swelling in her legs . I offered to schedule pt's appt for in the morning w/PCP, but isn't sure if waiting is okay so I transferred pt to [facility] to be advised further before scheduling.

## 2015-05-12 NOTE — Telephone Encounter (Signed)
[facility] - Client TELEPHONE ADVICE RECORD [facility]  Patient Name: Lori Valentine  DOB: 1985-08-29    Initial Comment Caller states they are having swelling in legs/feet   Nurse Assessment  Nurse: Leone Haven, RN, Susie Date/Time (Eastern Time): 05/12/2015 3:58:00 PM  Confirm and document reason for call. If symptomatic, describe symptoms. You must click the next button to save text entered. ---Caller states she is having swelling in legs and feet; she is an RA; RA MD told her that it may not be related to her RA and needs to have it evaluated; bilateral legs - from knees downward; denies numbness and tingling;  Has the patient traveled out of the country within the last 30 days? ---Not Applicable  Does the patient have any new or worsening symptoms? ---Yes  Will a triage be completed? ---Yes  Related visit to physician within the last 2 weeks? ---No  Does the PT have any chronic conditions? (i.e. diabetes, asthma, etc.) ---Yes  List chronic conditions. ---RA - Hypothyroidism  Did the patient indicate they were pregnant? ---No  Is this a behavioral health or substance abuse call? ---No     Guidelines    Guideline Title Affirmed Question Affirmed Notes  Ankle Swelling [1] Thigh, calf, or ankle swelling AND [2] bilateral AND [3] 1 side is more swollen    Final Disposition User   See Physician within 4 Hours (or PCP triage) Wisdom, RN, Susie    Comments  Please call; [phone]  patient states that areas of edema on ankles are hard to the touch and some areas are softer; she did state that she has 'bumps' located behind her knees; when she removed her loosely fitting house shoe - she stated her foot was purple in color; referred to 4 hour outcome with preference for the ED;   Referrals  GO TO FACILITY OTHER - SPECIFY   Disagree/Comply: Comply

## 2015-05-12 NOTE — Telephone Encounter (Signed)
Appt with Efraim Kaufmann was scheduled for tomorrow at 05/13/15 at 7 am.  Message routed to PCP for review and recommendations.

## 2015-05-13 ENCOUNTER — Ambulatory Visit (INDEPENDENT_AMBULATORY_CARE_PROVIDER_SITE_OTHER): Payer: Managed Care, Other (non HMO) | Admitting: Family

## 2015-05-13 ENCOUNTER — Encounter: Payer: Self-pay | Admitting: Family

## 2015-05-13 VITALS — BP 125/69 | HR 94 | Temp 97.9°F | Resp 18 | Ht 68.0 in | Wt 255.6 lb

## 2015-05-13 DIAGNOSIS — Z23 Encounter for immunization: Secondary | ICD-10-CM | POA: Diagnosis not present

## 2015-05-13 DIAGNOSIS — R41840 Attention and concentration deficit: Secondary | ICD-10-CM | POA: Diagnosis not present

## 2015-05-13 DIAGNOSIS — R600 Localized edema: Secondary | ICD-10-CM

## 2015-05-13 DIAGNOSIS — F419 Anxiety disorder, unspecified: Secondary | ICD-10-CM

## 2015-05-13 DIAGNOSIS — F329 Major depressive disorder, single episode, unspecified: Secondary | ICD-10-CM

## 2015-05-13 DIAGNOSIS — F32A Depression, unspecified: Secondary | ICD-10-CM

## 2015-05-13 DIAGNOSIS — M069 Rheumatoid arthritis, unspecified: Secondary | ICD-10-CM | POA: Diagnosis not present

## 2015-05-13 DIAGNOSIS — F418 Other specified anxiety disorders: Secondary | ICD-10-CM

## 2015-05-13 DIAGNOSIS — R0789 Other chest pain: Secondary | ICD-10-CM | POA: Insufficient documentation

## 2015-05-13 DIAGNOSIS — E039 Hypothyroidism, unspecified: Secondary | ICD-10-CM

## 2015-05-13 MED ORDER — LEVOTHYROXINE SODIUM 75 MCG PO TABS: 75.0000 ug | ORAL_TABLET | Freq: Every day | ORAL | Status: DC

## 2015-05-13 NOTE — Progress Notes (Signed)
Pre visit review using our clinic review tool, if applicable. No additional management support is needed unless otherwise documented below in the visit note. 

## 2015-05-13 NOTE — Progress Notes (Signed)
Subjective:    Patient ID: Lori Valentine, female    DOB: [birth year], 30 y.o.   MRN: [id number]  HPI  Ms. Lori Valentine is a 30 yr old female who presents today for follow up.  RA- 3 months ago she went to see Rheumatologist.  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  Was placed on methotrexate.  Reports unable to get OOB.  Reports that her legs are "huge." She went to the ED and and reports neg [place].  Reports that LE edema worsens as the day goes on. Reports that she did a 7 day juice fast. Swelling went down.  Went Friday for routine RA apt.  Lori Ohara  PA is waiting.  She is waiting for steroid rx.  She is seeing Dr. Corliss Valentine  Reports episodes of Chest pain which are associated with anxiety.  Reports that she took Effexor x 1 month.  Stopped because it made her feel "removed" and tired.  Reports difficulty completing tasks, feels overwhelmed.   Hypothyroid- has been out of synthroid for some time.    Review of Systems See HPI  Past Medical History  Diagnosis Date  . Rheumatoid arthritis (HCC)   . Hypothyroid     Social History   Social History  . Marital Status: Single    Spouse Name: N/A  . Number of Children: N/A  . Years of Education: N/A   Occupational History  . Not on file.   Social History Main Topics  . Smoking status: Current Every Day Smoker -- 10 years  . Smokeless tobacco: Not on file     Comment: 10 cigarettes daily  . Alcohol Use: 0.0 oz/week    0 Standard drinks or equivalent per week     Comment: rarely  . Drug Use: No  . Sexual Activity: Yes    Birth Control/ Protection:    Other Topics Concern  . Not on file   Social History Narrative   73 year old son   Lives with  Sister and nephew and son.   Works as Sales executive at [employer]       No past surgical history on file.  Family History  Problem Relation Age of Onset  . Schizophrenia Mother   . Bipolar disorder Mother   . Diabetes Other   . Heart disease Other     Allergies    Allergen Reactions  . Effexor [Venlafaxine]     "Groggy, unable to concentrate"    Current Outpatient Prescriptions on File Prior to Visit  Medication Sig Dispense Refill  . acetaminophen (TYLENOL) 500 MG tablet Take 1,000 mg by mouth every 6 (six) hours as needed. For pain.    . folic acid (FOLVITE) 1 MG tablet Take 2 mg by mouth daily.     Marland Kitchen levothyroxine (SYNTHROID, LEVOTHROID) 75 MCG tablet Take 1 tablet (75 mcg total) by mouth daily. (Patient not taking: Reported on 05/13/2015) 90 tablet 1   No current facility-administered medications on file prior to visit.    BP 125/69 mmHg  Pulse 94  Temp(Src) 97.9 F (36.6 C) (Oral)  Resp 18  Ht 5\' 8"  (1.727 m)  Wt 255 lb 9.6 oz (115.939 kg)  BMI 38.87 kg/m2  SpO2 99%  LMP 04/23/2015       Objective:   Physical Exam  Constitutional: She appears well-developed and well-nourished.  Cardiovascular: Normal rate, regular rhythm and normal heart sounds.   No murmur heard. Pulses:      Dorsalis pedis pulses  are 2+ on the right side, and 2+ on the left side.       Posterior tibial pulses are 2+ on the right side, and 2+ on the left side.  Pulmonary/Chest: Effort normal and breath sounds normal. No respiratory distress. She has no wheezes.  Musculoskeletal:  1+ bilateral LE edema   Psychiatric: She has a normal mood and affect. Her behavior is normal. Judgment and thought content normal.          Assessment & Plan:

## 2015-05-13 NOTE — Assessment & Plan Note (Signed)
Uncontrolled.  Advised pt to resume synthroid.  This could be contributing to her LE edema.

## 2015-05-13 NOTE — Assessment & Plan Note (Addendum)
EKG tracing is personally reviewed.  EKG notes NSR.  No acute changes.  Will refer for 2D echo due to c/o edema.  I think her chest pain is related to underlying anxiety. We did discuss wearing compression knee highs during the day to see if this helps.

## 2015-05-13 NOTE — Assessment & Plan Note (Signed)
Anxiety remains uncontrolled, but she is not open to medications for anxiety at this time. We did discuss possibility of ADHD and she is agreeable to referral for formal testing.

## 2015-05-13 NOTE — Patient Instructions (Signed)
Restart Synthroid. Purchase some knee high compression socks and wear daily. You will be contacted about your referral for ADHD testing and about your echocardiogram.

## 2015-05-13 NOTE — Assessment & Plan Note (Signed)
Uncontrolled, likely contributing to LE edema. Advised pt to follow back up with Dr. Fatima Sanger office re: rx for prednisone that she discussed with them at her most recent visit.

## 2015-05-14 ENCOUNTER — Encounter: Payer: Self-pay | Admitting: Family

## 2015-05-28 ENCOUNTER — Ambulatory Visit (HOSPITAL_BASED_OUTPATIENT_CLINIC_OR_DEPARTMENT_OTHER)
Admission: RE | Admit: 2015-05-28 | Discharge: 2015-05-28 | Disposition: A | Discharge status: Home / Self Care | Payer: Managed Care, Other (non HMO) | Source: Ambulatory Visit | Attending: Family | Admitting: Family

## 2015-05-28 DIAGNOSIS — F17200 Nicotine dependence, unspecified, uncomplicated: Secondary | ICD-10-CM | POA: Diagnosis not present

## 2015-05-28 DIAGNOSIS — R600 Localized edema: Secondary | ICD-10-CM | POA: Diagnosis not present

## 2015-05-28 NOTE — Progress Notes (Signed)
  Echocardiogram 2D Echocardiogram has been performed.  Arvil Chaco 05/28/2015, 2:00 PM

## 2015-05-29 ENCOUNTER — Encounter: Payer: Self-pay | Admitting: Family

## 2015-06-06 ENCOUNTER — Encounter: Payer: Self-pay | Admitting: Family

## 2015-06-24 ENCOUNTER — Encounter: Payer: Self-pay | Admitting: Family

## 2015-06-24 ENCOUNTER — Other Ambulatory Visit: Payer: Self-pay | Admitting: Family

## 2015-06-24 ENCOUNTER — Ambulatory Visit (INDEPENDENT_AMBULATORY_CARE_PROVIDER_SITE_OTHER): Payer: 59 | Admitting: Family

## 2015-06-24 VITALS — BP 106/84 | HR 96 | Temp 98.1°F | Resp 16 | Ht 68.0 in | Wt 262.4 lb

## 2015-06-24 DIAGNOSIS — E039 Hypothyroidism, unspecified: Secondary | ICD-10-CM

## 2015-06-24 DIAGNOSIS — M069 Rheumatoid arthritis, unspecified: Secondary | ICD-10-CM

## 2015-06-24 LAB — TSH: TSH: 16.79 u[IU]/mL — AB (ref 0.35–4.50)

## 2015-06-24 NOTE — Telephone Encounter (Signed)
Could you please check status of ADD referral- pt was never contacted, referral was placed on 05/13/15.  Thanks.

## 2015-06-24 NOTE — Progress Notes (Signed)
   Subjective:    Patient ID: Lori Valentine, female    DOB: Jan 16, 1986, 30 y.o.   MRN: [id number]  HPI  [name] presents today for follow up. Reports improvement in her edema  Hypothyroid- back on synthroid.  RA- back on methotrexate.    Wt Readings from Last 3 Encounters:  06/24/15 262 lb 6.4 oz (119.024 kg)  05/13/15 255 lb 9.6 oz (115.939 kg)  07/29/14 252 lb 3.2 oz (114.397 kg)   ADD testing- reports that she has not been contacted about her consult.  Review of Systems    see HPI  Past Medical History  Diagnosis Date  . Rheumatoid arthritis (HCC)   . Hypothyroid     Social History   Social History  . Marital Status: Single    Spouse Name: N/A  . Number of Children: N/A  . Years of Education: N/A   Occupational History  . Not on file.   Social History Main Topics  . Smoking status: Current Every Day Smoker -- 10 years  . Smokeless tobacco: Not on file     Comment: 10 cigarettes daily  . Alcohol Use: 0.0 oz/week    0 Standard drinks or equivalent per week     Comment: rarely  . Drug Use: No  . Sexual Activity: Yes    Birth Control/ Protection:    Other Topics Concern  . Not on file   Social History Narrative   69 year old son   Lives with  Sister and nephew and son.   Works as Sales executive at [employer]       No past surgical history on file.  Family History  Problem Relation Age of Onset  . Schizophrenia Mother   . Bipolar disorder Mother   . Diabetes Other   . Heart disease Other     Allergies  Allergen Reactions  . Effexor [Venlafaxine]     "Groggy, unable to concentrate"    Current Outpatient Prescriptions on File Prior to Visit  Medication Sig Dispense Refill  . folic acid (FOLVITE) 1 MG tablet Take 2 mg by mouth daily.     Marland Kitchen levothyroxine (SYNTHROID, LEVOTHROID) 75 MCG tablet Take 1 tablet (75 mcg total) by mouth daily. 90 tablet 0  . methotrexate (RHEUMATREX) 2.5 MG tablet Take 8 tablets by mouth once a week.     No  current facility-administered medications on file prior to visit.    BP 106/84 mmHg  Pulse 96  Temp(Src) 98.1 F (36.7 C) (Oral)  Resp 16  Ht 5\' 8"  (1.727 m)  Wt 262 lb 6.4 oz (119.024 kg)  BMI 39.91 kg/m2  SpO2 97%  LMP 06/21/2015    Objective:   Physical Exam  Constitutional: She is oriented to person, place, and time. She appears well-developed and well-nourished.  Cardiovascular: Normal rate, regular rhythm and normal heart sounds.   No murmur heard. Pulmonary/Chest: Effort normal and breath sounds normal. No respiratory distress. She has no wheezes.  Musculoskeletal: She exhibits no edema.  Neurological: She is alert and oriented to person, place, and time.  Psychiatric: She has a normal mood and affect. Her behavior is normal. Judgment and thought content normal.          Assessment & Plan:

## 2015-06-24 NOTE — Assessment & Plan Note (Signed)
Stable, management per Rheumatology.  

## 2015-06-24 NOTE — Assessment & Plan Note (Signed)
Feeling better back on synthroid.  Obtain follow up TSH.

## 2015-06-24 NOTE — Patient Instructions (Signed)
Please complete lab work prior to leaving. Schedule a complete physical at the front desk.  

## 2015-06-24 NOTE — Progress Notes (Signed)
Pre visit review using our clinic review tool, if applicable. No additional management support is needed unless otherwise documented below in the visit note. 

## 2015-06-24 NOTE — Telephone Encounter (Signed)
Thyroid is improving but synthroid still needs increase. Will increase from 75 mcg to .  Repeat tsh in 6 weeks, dx hypothyroid

## 2015-06-25 MED ORDER — LEVOTHYROXINE SODIUM 100 MCG PO TABS: 100.0000 ug | ORAL_TABLET | Freq: Every day | ORAL | Status: DC

## 2015-06-25 NOTE — Telephone Encounter (Signed)
Attempted to reach pt and left message on voicemail to check mychart message.

## 2015-08-08 ENCOUNTER — Telehealth: Payer: Self-pay | Admitting: Behavioral Health

## 2015-08-08 NOTE — Telephone Encounter (Signed)
Unable to reach patient at time of Pre-Visit Call.  Left message for patient to return call when available.    

## 2015-08-11 ENCOUNTER — Encounter: Payer: 59 | Admitting: Family

## 2015-08-11 ENCOUNTER — Telehealth: Payer: Self-pay | Admitting: Family

## 2015-08-11 DIAGNOSIS — Z0289 Encounter for other administrative examinations: Secondary | ICD-10-CM

## 2015-08-15 NOTE — Telephone Encounter (Signed)
Pt was no show 08/11/15 7:15am for cpe, 3rd no show w/in 1 year, pt has not rescheduled, charge or no charge?

## 2015-08-17 NOTE — Telephone Encounter (Signed)
Yes please

## 2015-08-18 ENCOUNTER — Encounter: Payer: Self-pay | Admitting: Family

## 2015-08-18 NOTE — Telephone Encounter (Signed)
Marked to charge and mailing no show letter °

## 2015-08-25 ENCOUNTER — Encounter: Payer: Self-pay | Admitting: Family

## 2015-08-27 ENCOUNTER — Telehealth: Payer: Self-pay | Admitting: Family

## 2015-08-27 NOTE — Telephone Encounter (Signed)
Patient dismissed from [facility] by Sandford Craze , effective Aug 25, 2015. Dismissal letter sent out by certified / registered mail.  DAJ  Received signed domestic return receipt verifying delivery of certified letter on Aug 30, 2015. Article number [id number]

## 2015-11-24 ENCOUNTER — Other Ambulatory Visit (HOSPITAL_COMMUNITY): Payer: Self-pay | Admitting: *Deleted

## 2015-11-25 ENCOUNTER — Ambulatory Visit (HOSPITAL_COMMUNITY)
Admission: RE | Admit: 2015-11-25 | Discharge: 2015-11-25 | Disposition: A | Discharge status: Home / Self Care | Payer: 59 | Source: Ambulatory Visit | Attending: Rheumatology | Admitting: Rheumatology

## 2015-11-25 DIAGNOSIS — M0579 Rheumatoid arthritis with rheumatoid factor of multiple sites without organ or systems involvement: Secondary | ICD-10-CM | POA: Insufficient documentation

## 2015-11-25 LAB — COMPREHENSIVE METABOLIC PANEL
ALT: 20 U/L (ref 14–54)
ANION GAP: 6 (ref 5–15)
AST: 18 U/L (ref 15–41)
Albumin: 3.4 g/dL — ABNORMAL LOW (ref 3.5–5.0)
Alkaline Phosphatase: 71 U/L (ref 38–126)
BILIRUBIN TOTAL: 0.4 mg/dL (ref 0.3–1.2)
BUN: 19 mg/dL (ref 6–20)
CHLORIDE: 106 mmol/L (ref 101–111)
CO2: 25 mmol/L (ref 22–32)
Calcium: 8.7 mg/dL — ABNORMAL LOW (ref 8.9–10.3)
Creatinine, Ser: 0.64 mg/dL (ref 0.44–1.00)
Glucose, Bld: 84 mg/dL (ref 65–99)
POTASSIUM: 4.2 mmol/L (ref 3.5–5.1)
Sodium: 137 mmol/L (ref 135–145)
Total Protein: 6.3 g/dL — ABNORMAL LOW (ref 6.5–8.1)

## 2015-11-25 LAB — CBC WITH DIFFERENTIAL/PLATELET
BASOS ABS: 0 10*3/uL (ref 0.0–0.1)
Basophils Relative: 0 %
EOS PCT: 0 %
Eosinophils Absolute: 0 10*3/uL (ref 0.0–0.7)
HEMATOCRIT: 39.5 % (ref 36.0–46.0)
Hemoglobin: 12.7 g/dL (ref 12.0–15.0)
LYMPHS ABS: 2.8 10*3/uL (ref 0.7–4.0)
LYMPHS PCT: 27 %
MCH: 29.5 pg (ref 26.0–34.0)
MCHC: 32.2 g/dL (ref 30.0–36.0)
MCV: 91.6 fL (ref 78.0–100.0)
MONO ABS: 0.6 10*3/uL (ref 0.1–1.0)
MONOS PCT: 5 %
NEUTROS ABS: 7 10*3/uL (ref 1.7–7.7)
Neutrophils Relative %: 68 %
PLATELETS: 315 10*3/uL (ref 150–400)
RBC: 4.31 MIL/uL (ref 3.87–5.11)
RDW: 13.1 % (ref 11.5–15.5)
WBC: 10.5 10*3/uL (ref 4.0–10.5)

## 2015-11-25 MED ORDER — ACETAMINOPHEN 160 MG/5ML PO SOLN
650.0000 mg | ORAL | Status: DC
  Filled 2015-11-25: qty 20.3

## 2015-11-25 MED ORDER — ACETAMINOPHEN 325 MG PO TABS
ORAL_TABLET | ORAL | Status: AC
Start: 2015-11-25 — End: 2015-11-25
  Administered 2015-11-25: 650 mg
  Filled 2015-11-25: qty 2

## 2015-11-25 MED ORDER — SODIUM CHLORIDE 0.9 % IV SOLN
400.0000 mg | INTRAVENOUS | Status: DC
  Administered 2015-11-25: 400 mg via INTRAVENOUS
  Filled 2015-11-25: qty 10

## 2015-11-25 MED ORDER — DIPHENHYDRAMINE HCL 25 MG PO CAPS
ORAL_CAPSULE | ORAL | Status: AC
  Administered 2015-11-25: 25 mg via ORAL
  Filled 2015-11-25: qty 1

## 2015-11-25 MED ORDER — SODIUM CHLORIDE 0.9 % IV SOLN
INTRAVENOUS | Status: DC
  Administered 2015-11-25: 11:00:00 via INTRAVENOUS

## 2015-11-25 MED ORDER — DIPHENHYDRAMINE HCL 25 MG PO CAPS
25.0000 mg | ORAL_CAPSULE | ORAL | Status: DC
  Administered 2015-11-25: 25 mg via ORAL

## 2015-11-25 NOTE — Discharge Instructions (Signed)
Infliximab injection  What is this medicine?  INFLIXIMAB (in FLIX i mab) is used to treat Crohn's disease and ulcerative colitis. It is also used to treat ankylosing spondylitis, psoriasis, and some forms of arthritis.  This medicine may be used for other purposes; ask your health care provider or pharmacist if you have questions.  What should I tell my health care provider before I take this medicine?  They need to know if you have any of these conditions:  -diabetes  -exposure to tuberculosis  -heart failure  -hepatitis or liver disease  -immune system problems  -infection  -lung or breathing disease, like COPD  -multiple sclerosis  -current or past resident of [place]  -seizure disorder  -an unusual or allergic reaction to infliximab, mouse proteins, other medicines, foods, dyes, or preservatives  -pregnant or trying to get pregnant  -breast-feeding  How should I use this medicine?  This medicine is for injection into a vein. It is usually given by a health care professional in a hospital or clinic setting.  A special MedGuide will be given to you by the pharmacist with each prescription and refill. Be sure to read this information carefully each time.  Talk to your pediatrician regarding the use of this medicine in children. Special care may be needed.  Overdosage: If you think you have taken too much of this medicine contact a poison control center or emergency room at once.  NOTE: This medicine is only for you. Do not share this medicine with others.  What if I miss a dose?  It is important not to miss your dose. Call your doctor or health care professional if you are unable to keep an appointment.  What may interact with this medicine?  Do not take this medicine with any of the following medications:  -anakinra  -rilonacept  This medicine may also interact with the following medications:  -vaccines  This list may not describe all possible interactions. Give your health care provider  a list of all the medicines, herbs, non-prescription drugs, or dietary supplements you use. Also tell them if you smoke, drink alcohol, or use illegal drugs. Some items may interact with your medicine.  What should I watch for while using this medicine?  Visit your doctor or health care professional for regular checks on your progress.  If you get a cold or other infection while receiving this medicine, call your doctor or health care professional. Do not treat yourself. This medicine may decrease your body's ability to fight infections. Before beginning therapy, your doctor may do a test to see if you have been exposed to tuberculosis.  This medicine may make the symptoms of heart failure worse in some patients. If you notice symptoms such as increased shortness of breath or swelling of the ankles or legs, contact your health care provider right away.  If you are going to have surgery or dental work, tell your health care professional or dentist that you have received this medicine.  If you take this medicine for plaque psoriasis, stay out of the sun. If you cannot avoid being in the sun, wear protective clothing and use sunscreen. Do not use sun lamps or tanning beds/booths.  What side effects may I notice from receiving this medicine?  Side effects that you should report to your doctor or health care professional as soon as possible:  -allergic reactions like skin rash, itching or hives, swelling of the face, lips, or tongue  -chest pain  -  fever or chills, usually related to the infusion  -muscle or joint pain  -red, scaly patches or raised bumps on the skin  -signs of infection - fever or chills, cough, sore throat, pain or difficulty passing urine  -swollen lymph nodes in the neck, underarm, or groin areas  -unexplained weight loss  -unusual bleeding or bruising  -unusually weak or tired  -yellowing of the eyes or skin  Side effects that usually do not require medical attention (report to your doctor or health  care professional if they continue or are bothersome):  -headache  -heartburn or stomach pain  -nausea, vomiting  This list may not describe all possible side effects. Call your doctor for medical advice about side effects. You may report side effects to FDA at 1-800-FDA-1088.  Where should I keep my medicine?  This drug is given in a hospital or clinic and will not be stored at home.  NOTE: This sheet is a summary. It may not cover all possible information. If you have questions about this medicine, talk to your doctor, pharmacist, or health care provider.     © 2016, [employer]. (2007-11-08 10:26:02)

## 2015-12-05 ENCOUNTER — Other Ambulatory Visit (HOSPITAL_COMMUNITY): Payer: Self-pay | Admitting: *Deleted

## 2015-12-09 ENCOUNTER — Ambulatory Visit (HOSPITAL_COMMUNITY)
Admission: RE | Admit: 2015-12-09 | Discharge: 2015-12-09 | Disposition: A | Discharge status: Home / Self Care | Payer: 59 | Source: Ambulatory Visit | Attending: Rheumatology | Admitting: Rheumatology

## 2015-12-09 DIAGNOSIS — M0579 Rheumatoid arthritis with rheumatoid factor of multiple sites without organ or systems involvement: Secondary | ICD-10-CM | POA: Diagnosis not present

## 2015-12-09 MED ORDER — ACETAMINOPHEN 325 MG PO TABS
ORAL_TABLET | ORAL | Status: AC
  Administered 2015-12-09: 650 mg
  Filled 2015-12-09: qty 2

## 2015-12-09 MED ORDER — SODIUM CHLORIDE 0.9 % IV SOLN
INTRAVENOUS | Status: DC
  Administered 2015-12-09: 09:00:00 via INTRAVENOUS

## 2015-12-09 MED ORDER — DIPHENHYDRAMINE HCL 25 MG PO CAPS: 25.0000 mg | ORAL_CAPSULE | ORAL | Status: DC

## 2015-12-09 MED ORDER — SODIUM CHLORIDE 0.9 % IV SOLN
400.0000 mg | INTRAVENOUS | Status: DC
  Administered 2015-12-09: 400 mg via INTRAVENOUS
  Filled 2015-12-09: qty 40

## 2015-12-09 MED ORDER — ACETAMINOPHEN 160 MG/5ML PO SUSP: 650.0000 mg | ORAL | Status: DC

## 2016-01-06 ENCOUNTER — Ambulatory Visit (HOSPITAL_COMMUNITY)
Admission: RE | Admit: 2016-01-06 | Discharge: 2016-01-06 | Disposition: A | Discharge status: Home / Self Care | Payer: 59 | Source: Ambulatory Visit | Attending: Rheumatology | Admitting: Rheumatology

## 2016-01-06 DIAGNOSIS — M0579 Rheumatoid arthritis with rheumatoid factor of multiple sites without organ or systems involvement: Secondary | ICD-10-CM | POA: Diagnosis present

## 2016-01-06 MED ORDER — SODIUM CHLORIDE 0.9 % IV SOLN
Freq: Once | INTRAVENOUS | Status: AC
Start: 2016-01-06 — End: 2016-01-06
  Administered 2016-01-06: 10:00:00 via INTRAVENOUS

## 2016-01-06 MED ORDER — SODIUM CHLORIDE 0.9 % IV SOLN
3.0000 mg/kg | Freq: Once | INTRAVENOUS | Status: AC
  Administered 2016-01-06: 400 mg via INTRAVENOUS
  Filled 2016-01-06: qty 40

## 2016-01-06 MED ORDER — DIPHENHYDRAMINE HCL 25 MG PO TABS
25.0000 mg | ORAL_TABLET | Freq: Once | ORAL | Status: DC
  Filled 2016-01-06: qty 1

## 2016-01-06 MED ORDER — ACETAMINOPHEN 325 MG PO TABS
650.0000 mg | ORAL_TABLET | Freq: Once | ORAL | Status: AC
  Administered 2016-01-06: 650 mg via ORAL

## 2016-01-06 MED ORDER — ACETAMINOPHEN 325 MG PO TABS
ORAL_TABLET | ORAL | Status: AC
Start: 2016-01-06 — End: 2016-01-06
  Filled 2016-01-06: qty 2

## 2016-01-06 NOTE — Progress Notes (Signed)
Pt states that she feels like she is getting over her cold and cannot take off work to wait another week to receive her remicade.  Per Amy, RN at Dr Fatima Sanger office it is okay to procede with remicade today and she does not need to wait a week.

## 2016-01-26 ENCOUNTER — Telehealth: Payer: Self-pay | Admitting: Rheumatology

## 2016-01-26 NOTE — Telephone Encounter (Signed)
Pt states her ins company referred her to [facility] for future infusions, they will need orders for any future infusions. fax-[phone]

## 2016-01-28 NOTE — Telephone Encounter (Signed)
I have called patient. Who is [facility]? I have never heard of this company. Left message for her to call back. Our infusions go to the hospital only

## 2016-01-29 ENCOUNTER — Telehealth: Payer: Self-pay | Admitting: Radiology

## 2016-01-29 ENCOUNTER — Telehealth: Payer: Self-pay | Admitting: Rheumatology

## 2016-01-29 NOTE — Telephone Encounter (Signed)
Patient is currently having Remicade infusions at [facility] and has reported she is doing well. I have gotten a call today from [employer]. At [facility] [phone] ext [phone]/ he states patient reports to him she is having abdominal pain. I have called patient x3 in past 2 days, and she does not answer the phone. I did leave her a message to see her PCP ASAP for her abdominal pain.  [employer]/ Caryn Bee has asked [place] to order patients infusions either at [facility] in [place] (I think this is with [facility]) I have tried to pull this up on the internet, but the site is blocked by [facility]. I have tried to explain to Caryn Bee that you are not able to send infusion orders anywhere except at [facility]. He also states patient wants to have home infusions due to child care issues, explained as I have previously you do not agree with plan for home infusions. I have advised that patient may have to see another Rheumatologist in order to have orders sent any where other than [facility], since you only send orders there. He states patient would like to discuss with you. I have indicated she needs to call here to make an appt for sooner appt. To discuss if she would like. Currently she is scheduled with Mr Leane Call for Dec 5th

## 2016-01-29 NOTE — Telephone Encounter (Signed)
Patient left message stating the infusion company her insurance told her she will be using now is [facility] in [place]. She called as an Financial planner.

## 2016-01-29 NOTE — Telephone Encounter (Signed)
I can not send orders there.

## 2016-01-29 NOTE — Telephone Encounter (Signed)
I have called patient again regarding this, she has asked me to send orders to an unknown company in [place], Dr Corliss Skains only orders infusions through [place]. Patient called back, left another message for me to send orders.  She did not answer phone when I called her back, left message to advise I can not send orders, will discuss with her if she would like.

## 2016-02-24 ENCOUNTER — Telehealth: Payer: Self-pay | Admitting: Rheumatology

## 2016-02-24 NOTE — Telephone Encounter (Signed)
Caryn Bee w/[employer] calling to see if patient is going to continue with her [employer] injections. Cb# [phone] xt [id number].

## 2016-03-01 ENCOUNTER — Telehealth: Payer: Self-pay | Admitting: Pharmacist

## 2016-03-01 ENCOUNTER — Encounter: Payer: Self-pay | Admitting: Pharmacist

## 2016-03-01 NOTE — Telephone Encounter (Signed)
Called back.  Second attempt.  There was no answer.

## 2016-03-01 NOTE — Telephone Encounter (Signed)
Left message for Lori Valentine, yes patient will continue infusions. April is working on the prior [place].

## 2016-03-01 NOTE — Telephone Encounter (Signed)
Called patient to let her know that her Remicade infusion was approved through [place] from [birth year]/28 yo 03/06/16.  Left message for patient.    [name], Pharm.D., BCPS Clinical Pharmacist Pager: [phone] Phone: [phone] 03/01/2016 3:56 PM

## 2016-03-01 NOTE — Telephone Encounter (Signed)
Patient called back.  Informed her that her infliximab infusion was approved from 01/18/16 to 03/06/16.  Patient reports she is due for an infusion at this time but she will not be able to infuse before 03/06/16.    Patient reports that she will plan to continue Remicade infusions at this time, but she does not want to continue infusions at the hospital long term since it is hard to get off work for the infusions.  She reports she wants to infuse at home but she knows that our office policy is to have patients infuse at the hospital outpatient infusion center.  Patient reports she may be interested in switching back to injectable medication and wants to discuss with Mr. Leane Call at her follow up appointment on 03/30/16.    Will resubmit infusion pre-certification to extend infusion approval date.  Will update patient once I know outcome of the pre-certification.     [name], Pharm.D., BCPS Clinical Pharmacist Pager: [phone] Phone: [phone] 03/01/2016 5:02 PM

## 2016-03-01 NOTE — Telephone Encounter (Signed)
Patient returned Rachel's phone call. Please call patient back.  

## 2016-03-01 NOTE — Progress Notes (Unsigned)
Received fax from [facility] regarding patient's infliximab infusion.  Authorization received from 01/18/16 to 03/06/16.  Authorization [id number].    Will scan authorization into patient's chart.    [name], Pharm.D., BCPS Clinical Pharmacist Pager: [phone] Phone: [phone] 03/01/2016 3:40 PM

## 2016-03-02 ENCOUNTER — Telehealth: Payer: Self-pay | Admitting: Pharmacist

## 2016-03-02 NOTE — Telephone Encounter (Signed)
Phone call was made to [place] by Abran Duke, Pharmacy Technician, regarding patient's Remicade authorization.  Authorization was from 01/18/16 to 03/06/16.  Requested extension of authorization as patient is unable to infuse prior to 03/06/16.  Authorization was extended through 04/04/16.  Rosann Auerbach reports a new letter will be sent with the extended authorization dates.    I called patient to update her on this information.  Left her a voicemail informing her that the authorization was extended.  Advised her to call if she had any questions.    [name], Pharm.D., BCPS Clinical Pharmacist Pager: [phone] Phone: [phone] 03/02/2016 10:38 AM

## 2016-03-09 ENCOUNTER — Ambulatory Visit: Payer: Self-pay | Admitting: Rheumatology

## 2016-03-16 ENCOUNTER — Other Ambulatory Visit (HOSPITAL_COMMUNITY): Payer: Self-pay | Admitting: *Deleted

## 2016-03-17 ENCOUNTER — Ambulatory Visit (HOSPITAL_COMMUNITY)
Admission: RE | Admit: 2016-03-17 | Discharge: 2016-03-17 | Disposition: A | Discharge status: Home / Self Care | Payer: 59 | Source: Ambulatory Visit | Attending: Rheumatology | Admitting: Rheumatology

## 2016-03-17 ENCOUNTER — Encounter (HOSPITAL_COMMUNITY): Payer: Self-pay

## 2016-03-17 DIAGNOSIS — M0579 Rheumatoid arthritis with rheumatoid factor of multiple sites without organ or systems involvement: Secondary | ICD-10-CM | POA: Diagnosis not present

## 2016-03-17 LAB — COMPREHENSIVE METABOLIC PANEL
ALBUMIN: 3.7 g/dL (ref 3.5–5.0)
ALK PHOS: 67 U/L (ref 38–126)
ALT: 32 U/L (ref 14–54)
AST: 28 U/L (ref 15–41)
Anion gap: 7 (ref 5–15)
BILIRUBIN TOTAL: 0.7 mg/dL (ref 0.3–1.2)
BUN: 18 mg/dL (ref 6–20)
CALCIUM: 8.8 mg/dL — AB (ref 8.9–10.3)
CO2: 24 mmol/L (ref 22–32)
CREATININE: 0.63 mg/dL (ref 0.44–1.00)
Chloride: 108 mmol/L (ref 101–111)
GFR calc Af Amer: >60 mL/min (ref >60)
GFR calc non Af Amer: >60 mL/min (ref >60)
GLUCOSE: 99 mg/dL (ref 65–99)
Potassium: 3.6 mmol/L (ref 3.5–5.1)
Sodium: 139 mmol/L (ref 135–145)
TOTAL PROTEIN: 6.9 g/dL (ref 6.5–8.1)

## 2016-03-17 LAB — CBC
HEMATOCRIT: 37.7 % (ref 36.0–46.0)
HEMOGLOBIN: 12.7 g/dL (ref 12.0–15.0)
MCH: 29.2 pg (ref 26.0–34.0)
MCHC: 33.7 g/dL (ref 30.0–36.0)
MCV: 86.7 fL (ref 78.0–100.0)
Platelets: 299 10*3/uL (ref 150–400)
RBC: 4.35 MIL/uL (ref 3.87–5.11)
RDW: 12.6 % (ref 11.5–15.5)
WBC: 5.6 10*3/uL (ref 4.0–10.5)

## 2016-03-17 LAB — DIFFERENTIAL
BASOS PCT: 0 %
Basophils Absolute: 0 10*3/uL (ref 0.0–0.1)
EOS ABS: 0.1 10*3/uL (ref 0.0–0.7)
EOS PCT: 2 %
LYMPHS PCT: 34 %
Lymphs Abs: 1.9 10*3/uL (ref 0.7–4.0)
MONO ABS: 0.3 10*3/uL (ref 0.1–1.0)
Monocytes Relative: 6 %
Neutro Abs: 3.3 10*3/uL (ref 1.7–7.7)
Neutrophils Relative %: 58 %

## 2016-03-17 MED ORDER — SODIUM CHLORIDE 0.9 % IV SOLN
INTRAVENOUS | Status: DC
  Administered 2016-03-17: 11:00:00 via INTRAVENOUS

## 2016-03-17 MED ORDER — SODIUM CHLORIDE 0.9 % IV SOLN
400.0000 mg | Freq: Once | INTRAVENOUS | Status: AC
  Administered 2016-03-17: 400 mg via INTRAVENOUS
  Filled 2016-03-17: qty 40

## 2016-03-17 MED ORDER — DIPHENHYDRAMINE HCL 25 MG PO CAPS: 25.0000 mg | ORAL_CAPSULE | Freq: Once | ORAL | Status: DC

## 2016-03-17 MED ORDER — ACETAMINOPHEN 325 MG PO TABS
650.0000 mg | ORAL_TABLET | Freq: Once | ORAL | Status: AC
  Administered 2016-03-17: 650 mg via ORAL

## 2016-03-17 MED ORDER — ACETAMINOPHEN 325 MG PO TABS
ORAL_TABLET | ORAL | Status: AC
  Filled 2016-03-17: qty 2

## 2016-03-17 NOTE — Progress Notes (Signed)
Labs normal.

## 2016-03-30 ENCOUNTER — Ambulatory Visit: Payer: Self-pay | Admitting: Rheumatology

## 2016-04-01 ENCOUNTER — Telehealth: Payer: Self-pay | Admitting: Rheumatology

## 2016-04-01 NOTE — Telephone Encounter (Signed)
Gillie Manners. W/ [employer] health care [phone] ext [phone] would like to speak with someone about the patient's infusions and authorization process

## 2016-04-02 ENCOUNTER — Telehealth: Payer: Self-pay

## 2016-04-02 NOTE — Telephone Encounter (Signed)
Called Lori Valentine back but had to leave a message. Let call back number for him to reach me

## 2016-04-02 NOTE — Telephone Encounter (Signed)
Spoke with Caryn Bee, a representative from [employer] regarding her REMICAIDE infusion. The company has been making exceptions for the patient to have her services completed at a hospital setting as they prefer her to do it at home.   Representative spoke with patient after her last infusion and she expressed that she did feel like the medication was working as well as it should. Representative was wondering if she had been seen recently to discuss her issues and if he should continue to make an exception for her services.   Her next appointment is scheduled for Apr 15, 2016. He plans to call back on Apr 16, 2016 to see if the patient has changed therapy.   Brenda Cowher, [place], CPhT

## 2016-04-15 ENCOUNTER — Ambulatory Visit: Payer: Self-pay | Admitting: Rheumatology

## 2016-04-19 ENCOUNTER — Telehealth: Payer: Self-pay

## 2016-04-19 NOTE — Telephone Encounter (Signed)
Nurse case manager, Caryn Bee left a message on my phone regarding the patients Remicade and questioning her continuation on the treatment. He spoke with Ms. [place] in December 2017 and she told him that she didn't feel the medication was working. She was suppose to have an appointment on 04/15/16 but rescheduled it for 05/03/16.  Left a message on his direct line:  [phone] [id number]  Abran Duke, CPhT

## 2016-04-29 ENCOUNTER — Other Ambulatory Visit: Payer: Self-pay | Admitting: Radiology

## 2016-04-29 DIAGNOSIS — M0579 Rheumatoid arthritis with rheumatoid factor of multiple sites without organ or systems involvement: Secondary | ICD-10-CM

## 2016-04-29 NOTE — Progress Notes (Signed)
Patient has appt 05/03/16 TB gold negative in July 2017

## 2016-05-03 ENCOUNTER — Encounter: Payer: Self-pay | Admitting: Rheumatology

## 2016-05-03 ENCOUNTER — Ambulatory Visit (INDEPENDENT_AMBULATORY_CARE_PROVIDER_SITE_OTHER): Payer: 59 | Admitting: Rheumatology

## 2016-05-03 VITALS — BP 132/82 | HR 84 | Resp 16 | Ht 67.0 in | Wt 288.0 lb

## 2016-05-03 DIAGNOSIS — Z79899 Other long term (current) drug therapy: Secondary | ICD-10-CM

## 2016-05-03 DIAGNOSIS — E669 Obesity, unspecified: Secondary | ICD-10-CM

## 2016-05-03 DIAGNOSIS — M25561 Pain in right knee: Secondary | ICD-10-CM

## 2016-05-03 DIAGNOSIS — IMO0001 Reserved for inherently not codable concepts without codable children: Secondary | ICD-10-CM

## 2016-05-03 DIAGNOSIS — M25562 Pain in left knee: Secondary | ICD-10-CM

## 2016-05-03 DIAGNOSIS — Z6841 Body Mass Index (BMI) 40.0 and over, adult: Secondary | ICD-10-CM | POA: Diagnosis not present

## 2016-05-03 DIAGNOSIS — M545 Low back pain: Secondary | ICD-10-CM

## 2016-05-03 DIAGNOSIS — M069 Rheumatoid arthritis, unspecified: Secondary | ICD-10-CM | POA: Diagnosis not present

## 2016-05-03 DIAGNOSIS — G8929 Other chronic pain: Secondary | ICD-10-CM

## 2016-05-03 MED ORDER — FOLIC ACID 1 MG PO TABS: 2.0000 mg | ORAL_TABLET | Freq: Every day | ORAL | 4 refills | Status: DC

## 2016-05-03 MED ORDER — LIDOCAINE HCL 1 % IJ SOLN
1.5000 mL | INTRAMUSCULAR | Status: AC | PRN
  Administered 2016-05-03: 1.5 mL

## 2016-05-03 MED ORDER — SODIUM CHLORIDE 0.9 % IV SOLN: INTRAVENOUS | Status: DC

## 2016-05-03 MED ORDER — TRIAMCINOLONE ACETONIDE 40 MG/ML IJ SUSP
40.0000 mg | INTRAMUSCULAR | Status: AC | PRN
  Administered 2016-05-03: 40 mg via INTRA_ARTICULAR

## 2016-05-03 MED ORDER — METHOTREXATE 2.5 MG PO TABS: 20.0000 mg | ORAL_TABLET | ORAL | 0 refills | Status: AC

## 2016-05-03 NOTE — Progress Notes (Signed)
Office Visit Note  Patient: Lori Valentine             Date of Birth: 26-Apr-1985           MRN: [id number]             PCP: No primary care provider on file. Referring: No ref. provider found Visit Date: 05/03/2016 Occupation: @GUAROCC @    Subjective:  Follow-up Follow-up on rheumatoid arthritis and high-risk prescription.  History of Present Illness: Lori Valentine is a 31 y.o. female  Last seen 12/09/2015. History of rheumatoid arthritis with positive rheumatoid factor.  Being treated with Remicade every 8 weeks at 3 mg/kg Also methotrexate 8 per week Folic acid 2 per day Was on 5 mg of prednisone on September 2017 visit and the plan was to taper her down to 4 mg in September, 3 mg in October, 2 mg of November, 1 mg in December, and then stop.  Today: Patient states that she is doing really well with the Remicade/methotrexate therapy. For the first time since getting treatment she is pain-free from her RA. She is taking Remicade every 8 weeks at the hospital. She is enjoying the Hospital infusion. Her insurance company offered her home Remicade infusions through a home health nurse. Patient declines this option. She prefers to come to the hospital to get her infusions. In addition, we do not write orders for home infusions Her insurance company recommended that she changed doctors since we do not write orders for home infusions. Patient declines changing doctors  Her main complaint today is that she is having knee pain and discomfort. 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 The knee pain may be causing pain to her ankles as well as her lower back and hips.   Activities of Daily Living:  Patient reports morning stiffness for 30 minutes.   Patient Denies nocturnal pain.  Difficulty dressing/grooming: Denies Difficulty climbing stairs: Denies Difficulty getting out of chair: Denies Difficulty using hands for taps, buttons,  cutlery, and/or writing: Denies   Review of Systems  Constitutional: Negative for fatigue.  HENT: Negative for mouth sores and mouth dryness.   Eyes: Negative for dryness.  Respiratory: Negative for shortness of breath.   Gastrointestinal: Negative for constipation and diarrhea.  Musculoskeletal: Negative for myalgias and myalgias.  Skin: Negative for sensitivity to sunlight.  Psychiatric/Behavioral: Negative for decreased concentration and sleep disturbance.    PMFS History:  Patient Active Problem List   Diagnosis Date Noted  . Rheumatoid arthritis ([place]) 05/13/2015  . Atypical chest pain 05/13/2015  . Anxiety and depression 07/29/2014  . Hypothyroidism 07/29/2014    Past Medical History:  Diagnosis Date  . Hypothyroid   . Rheumatoid arthritis ([place])     Family History  Problem Relation Age of Onset  . Schizophrenia Mother   . Bipolar disorder Mother   . Diabetes Other   . Heart disease Other    No past surgical history on file. Social History   Social History Narrative   7 year old son   Lives with  Sister and nephew and son.   Works as Art therapist at [employer]        Objective: Vital Signs: BP 132/82   Pulse 84   Resp 16   Ht 5' 7"  (1.702 m)   Wt 288 lb (130.6 kg)   LMP 04/12/2016   BMI 45.11 kg/m    Physical Exam  Constitutional: She is oriented to  person, place, and time. She appears well-developed and well-nourished.  HENT:  Head: Normocephalic and atraumatic.  Eyes: EOM are normal. Pupils are equal, round, and reactive to light.  Cardiovascular: Normal rate, regular rhythm and normal heart sounds.  Exam reveals no gallop and no friction rub.   No murmur heard. Pulmonary/Chest: Effort normal and breath sounds normal. She has no wheezes. She has no rales.  Abdominal: Soft. Bowel sounds are normal. She exhibits no distension. There is no tenderness. There is no guarding. No hernia.  Musculoskeletal: Normal range of motion. She exhibits no  edema, tenderness or deformity.  Lymphadenopathy:    She has no cervical adenopathy.  Neurological: She is alert and oriented to person, place, and time. Coordination normal.  Skin: Skin is warm and dry. Capillary refill takes less than 2 seconds. No rash noted.  Psychiatric: She has a normal mood and affect. Her behavior is normal.     Musculoskeletal Exam:  Full range of motion of all joints Grip strength is equal and strong bilaterally Fiber myalgia tender points are all absent  CDAI Exam: CDAI Homunculus Exam:   Joint Counts:  CDAI Tender Joint count: 0 CDAI Swollen Joint count: 0  Global Assessments:  Patient Global Assessment: 0 Provider Global Assessment: 0  No synovitis on exam  Investigation: No additional findings. Hospital Outpatient Visit on 03/17/2016  Component Date Value Ref Range Status  . WBC 03/17/2016 5.6  4.0 - 10.5 K/uL Final  . RBC 03/17/2016 4.35  3.87 - 5.11 MIL/uL Final  . Hemoglobin 03/17/2016 12.7  12.0 - 15.0 g/dL Final  . HCT 03/17/2016 37.7  36.0 - 46.0 % Final  . MCV 03/17/2016 86.7  78.0 - 100.0 fL Final  . MCH 03/17/2016 29.2  26.0 - 34.0 pg Final  . MCHC 03/17/2016 33.7  30.0 - 36.0 g/dL Final  . RDW 03/17/2016 12.6  11.5 - 15.5 % Final  . Platelets 03/17/2016 299  150 - 400 K/uL Final  . Sodium 03/17/2016 139  135 - 145 mmol/L Final  . Potassium 03/17/2016 3.6  3.5 - 5.1 mmol/L Final  . Chloride 03/17/2016 108  101 - 111 mmol/L Final  . CO2 03/17/2016 24  22 - 32 mmol/L Final  . Glucose, Bld 03/17/2016 99  65 - 99 mg/dL Final  . BUN 03/17/2016 18  6 - 20 mg/dL Final  . Creatinine, Ser 03/17/2016 0.63  0.44 - 1.00 mg/dL Final  . Calcium 03/17/2016 8.8* 8.9 - 10.3 mg/dL Final  . Total Protein 03/17/2016 6.9  6.5 - 8.1 g/dL Final  . Albumin 03/17/2016 3.7  3.5 - 5.0 g/dL Final  . AST 03/17/2016 28  15 - 41 U/L Final  . ALT 03/17/2016 32  14 - 54 U/L Final  . Alkaline Phosphatase 03/17/2016 67  38 - 126 U/L Final  . Total Bilirubin  03/17/2016 0.7  0.3 - 1.2 mg/dL Final  . GFR calc non Af Amer 03/17/2016 >60  >60 mL/min Final  . GFR calc Af Amer 03/17/2016 >60  >60 mL/min Final   Comment: (NOTE) The eGFR has been calculated using the CKD EPI equation. This calculation has not been validated in all clinical situations. eGFR's persistently <60 mL/min signify possible Chronic Kidney Disease.   . Anion gap 03/17/2016 7  5 - 15 Final  . Neutrophils Relative % 03/17/2016 58  % Final  . Neutro Abs 03/17/2016 3.3  1.7 - 7.7 K/uL Final  . Lymphocytes Relative 03/17/2016 34  % Final  .  Lymphs Abs 03/17/2016 1.9  0.7 - 4.0 K/uL Final  . Monocytes Relative 03/17/2016 6  % Final  . Monocytes Absolute 03/17/2016 0.3  0.1 - 1.0 K/uL Final  . Eosinophils Relative 03/17/2016 2  % Final  . Eosinophils Absolute 03/17/2016 0.1  0.0 - 0.7 K/uL Final  . Basophils Relative 03/17/2016 0  % Final  . Basophils Absolute 03/17/2016 0.0  0.0 - 0.1 K/uL Final  Hospital Outpatient Visit on 11/25/2015  Component Date Value Ref Range Status  . WBC 11/25/2015 10.5  4.0 - 10.5 K/uL Final  . RBC 11/25/2015 4.31  3.87 - 5.11 MIL/uL Final  . Hemoglobin 11/25/2015 12.7  12.0 - 15.0 g/dL Final  . HCT 11/25/2015 39.5  36.0 - 46.0 % Final  . MCV 11/25/2015 91.6  78.0 - 100.0 fL Final  . MCH 11/25/2015 29.5  26.0 - 34.0 pg Final  . MCHC 11/25/2015 32.2  30.0 - 36.0 g/dL Final  . RDW 11/25/2015 13.1  11.5 - 15.5 % Final  . Platelets 11/25/2015 315  150 - 400 K/uL Final  . Neutrophils Relative % 11/25/2015 68  % Final  . Neutro Abs 11/25/2015 7.0  1.7 - 7.7 K/uL Final  . Lymphocytes Relative 11/25/2015 27  % Final  . Lymphs Abs 11/25/2015 2.8  0.7 - 4.0 K/uL Final  . Monocytes Relative 11/25/2015 5  % Final  . Monocytes Absolute 11/25/2015 0.6  0.1 - 1.0 K/uL Final  . Eosinophils Relative 11/25/2015 0  % Final  . Eosinophils Absolute 11/25/2015 0.0  0.0 - 0.7 K/uL Final  . Basophils Relative 11/25/2015 0  % Final  . Basophils Absolute 11/25/2015 0.0   0.0 - 0.1 K/uL Final  . Sodium 11/25/2015 137  135 - 145 mmol/L Final  . Potassium 11/25/2015 4.2  3.5 - 5.1 mmol/L Final  . Chloride 11/25/2015 106  101 - 111 mmol/L Final  . CO2 11/25/2015 25  22 - 32 mmol/L Final  . Glucose, Bld 11/25/2015 84  65 - 99 mg/dL Final  . BUN 11/25/2015 19  6 - 20 mg/dL Final  . Creatinine, Ser 11/25/2015 0.64  0.44 - 1.00 mg/dL Final  . Calcium 11/25/2015 8.7* 8.9 - 10.3 mg/dL Final  . Total Protein 11/25/2015 6.3* 6.5 - 8.1 g/dL Final  . Albumin 11/25/2015 3.4* 3.5 - 5.0 g/dL Final  . AST 11/25/2015 18  15 - 41 U/L Final  . ALT 11/25/2015 20  14 - 54 U/L Final  . Alkaline Phosphatase 11/25/2015 71  38 - 126 U/L Final  . Total Bilirubin 11/25/2015 0.4  0.3 - 1.2 mg/dL Final  . GFR calc non Af Amer 11/25/2015 >60  >60 mL/min Final  . GFR calc Af Amer 11/25/2015 >60  >60 mL/min Final   Comment: (NOTE) The eGFR has been calculated using the CKD EPI equation. This calculation has not been validated in all clinical situations. eGFR's persistently <60 mL/min signify possible Chronic Kidney Disease.   . Anion gap 11/25/2015 6  5 - 15 Final     Imaging: No results found.  Speciality Comments: No specialty comments available.    Procedures:  Large Joint Inj Date/Time: 05/03/2016 2:39 PM Performed by: Eliezer Lofts Authorized by: Eliezer Lofts   Consent Given by:  Patient Site marked: the procedure site was marked   Timeout: prior to procedure the correct patient, procedure, and site was verified   Indications:  Pain and joint swelling Location:  Knee Site:  L knee Prep: patient was prepped  and draped in usual sterile fashion   Needle Size:  27 G Needle Length:  1.5 inches Approach:  Medial Ultrasound Guidance: No   Fluoroscopic Guidance: No   Arthrogram: No   Medications:  1.5 mL lidocaine 1 %; 40 mg triamcinolone acetonide 40 MG/ML Aspiration Attempted: Yes   Aspirate amount (mL):  0 Patient tolerance:  Patient tolerated the  procedure well with no immediate complications  Left knee injected. No complications Patient's pain is a 0 after the injection.   Allergies: Effexor [venlafaxine]   Assessment / Plan:     Visit Diagnoses: Rheumatoid arthritis involving multiple sites, unspecified rheumatoid factor presence (HCC)  High risk medications (not anticoagulants) long-term use - 05/03/2016:==> Remicade q 8 wks; MTX 8/wk; Folic Acid 90m/d; Pred 594m AM (tapering lower since 12/09/2015 visit)  Class 3 obesity with body mass index (BMI) of 45.0 to 49.9 in adult, unspecified obesity type, unspecified whether serious comorbidity present (HCC)  Arthralgia of both knees  Chronic pain of left knee  Chronic low back pain, unspecified back pain laterality, with sciatica presence unspecified   Plan: #1: Rheumatoid arthritis. Patient has 0 pain from her rheumatoid arthritis. She is responding very well to the therapy of Remicade 4m60mg every 8 weeks, methotrexate 8 pills per week, folic acid 2 mg every day. She is also finished her prednisone taper as prescribed. She is doing so well that she does not need any more prednisone.   #2: High risk prescription Remicade every 8 weeks. Last infusion December 2017. Diffusion due in 8 weeks from that date. She gets labs every time she goes to the infusion center which is adequate schedule for us Koreae takes methotrexate 8 pills per week Folic acid 2 mg every day Stop prednisone per our request through a gradual taper and has responded well  #3: Patient is considering bariatric surgery but wants to get our advice. We discussed the risks and benefits of bariatric surgery. We also advised her that she will need to stop the Remicade temporarily prior and post surgery. She also is unable to take prednisone after getting the surgery. Prednisone is our go to therapy for flares should the patient require. All of these factors should be considered prior to moving forward w/ bariatric  surgery. In fact, if patient's RA is better, and if we can make her knees feel better, then pt may be able to exercise and lose weight on her own. She has been successful with significant weight loss in the past and feels confident she can do it again.  #4: OA of bilateral knees. X-rays were done February 2017. X-rays of bilateral knee shows moderate medial compartment narrowing. Patient is currently having bilateral knee pain bilateral hip pain. I suspect that that is coming from her osteoarthritis of the knees and she may benefit from Visco supplementation. We discussed this in great detail and patient is eager to move forward with that if appropriate Review of x-rays done 05/09/2015 shows: Severe medial compartment narrowing of the right knee. No CPPD. Moderate patellofemoral joint space narrowing.  Left knee shows less severe medial compartment narrowing than the right. No CPPD. Moderate patellofemoral narrowing. Note: These x-ray readings were actually done on February 2017 visit but not documented in those charts. Please accept these readings today to reflect bows or discharge on the patient for bradycardia 2017  #5: Bilateral hip pain  #6: CBC with differential, CMP with GFR every 2 months during infusion  #7: States that her Remicade infusions  in the hospital are working out well for her. She was told by the insurance company that they can offer her home infusion nurse. She prefers not to do home infusion.  She also is aware that we are not able to offer her home infusion orders through our office. She is aware that we will only be able to send orders to the hospital only for infusions. She will discuss this with her insurance company and let them know that she prefers to do hospital infusions only. The insurance company advised her that she can change doctors in order to get infusion orders for home infusions. Pt declines this suggestion from the insurance company.  #8: Message  sent to [facility] for applying for Hyalgan or Euflex of both knees as soon as possible.  #9: I refilled patient's methotrexate, folic acid.  #10: Last TB Gold negative was July 2017. Next TB goal will be due July 2018.    Orders: Orders Placed This Encounter  Procedures  . Large Joint Injection/Arthrocentesis   Meds ordered this encounter  Medications  . methotrexate (RHEUMATREX) 2.5 MG tablet    Sig: Take 8 tablets (20 mg total) by mouth once a week.    Dispense:  96 tablet    Refill:  0    Order Specific Question:   Supervising Provider    Answer:   Bo Merino [[id number]]  . folic acid (FOLVITE) 1 MG tablet    Sig: Take 2 tablets (2 mg total) by mouth daily.    Dispense:  180 tablet    Refill:  4    Order Specific Question:   Supervising Provider    Answer:   Bo Merino [[id number]]  . inFLIXimab in sodium chloride 0.9 % 250 mL    Sig: 14m/kg q 8 wks    Do not fill; this Rx is written to establish pt is on this drug. We give infusion in our hospital    Order Specific Question:   Supervising Provider    Answer:   DBo Merino[[[id number]   Face-to-face time spent with patient was 30 minutes. 50% of time was spent in counseling and coordination of care.  Follow-Up Instructions: Return in about 5 months (around 10/01/2016) for RA,Remicade q 8wks;mtx 8/wk; both knee pain; .   NEliezer Lofts PA-C  Note - This record has been created using [employer]  Chart creation errors have been sought, but may not always  have been located. Such creation errors do not reflect on  the standard of medical care.

## 2016-05-04 ENCOUNTER — Telehealth: Payer: Self-pay

## 2016-05-04 NOTE — Telephone Encounter (Signed)
Spoke with Okey Regal from [facility] checking on the status for patients Remicaide Infusion pre-certification. The form was faxed on 1/24 to [phone]. She states that there is no current request pending.We completed a request over the phone. We should know something in about 3 days via fax.   Reference number: [id number]  Abran Duke, CPhT

## 2016-05-05 ENCOUNTER — Telehealth: Payer: Self-pay

## 2016-05-05 NOTE — Telephone Encounter (Signed)
Received a fax form [employer] regarding a pre-certification approval for REMICADE INFUSION from 05/04/2016 to 05/04/2017. Left message for patient to call back to inform them.   Request ID: [id number]   Will scan document into epic.  Vyom Brass, [place], CPhT   9:02 AM

## 2016-05-05 NOTE — Telephone Encounter (Signed)
Patient returned phone call.  Confirms she also got a call from her insurance with the approval information.  Advised patient she may still be responsible for a deductible and/or copay with her insurance.  She voiced understanding.  She denied any questions or concerns regarding her infusion at this time.     [name], Pharm.D., BCPS, CPP Clinical Pharmacist Pager: [phone] Phone: [phone] 05/05/2016 10:50 AM

## 2016-05-11 ENCOUNTER — Other Ambulatory Visit (HOSPITAL_COMMUNITY): Payer: Self-pay | Admitting: *Deleted

## 2016-05-12 ENCOUNTER — Ambulatory Visit (HOSPITAL_COMMUNITY)
Admission: RE | Admit: 2016-05-12 | Discharge: 2016-05-12 | Disposition: A | Discharge status: Home / Self Care | Payer: 59 | Source: Ambulatory Visit | Attending: Rheumatology | Admitting: Rheumatology

## 2016-05-12 DIAGNOSIS — M0579 Rheumatoid arthritis with rheumatoid factor of multiple sites without organ or systems involvement: Secondary | ICD-10-CM | POA: Diagnosis not present

## 2016-05-12 LAB — COMPREHENSIVE METABOLIC PANEL
ALBUMIN: 3.8 g/dL (ref 3.5–5.0)
ALT: 31 U/L (ref 14–54)
AST: 28 U/L (ref 15–41)
Alkaline Phosphatase: 77 U/L (ref 38–126)
Anion gap: 8 (ref 5–15)
BUN: 13 mg/dL (ref 6–20)
CHLORIDE: 105 mmol/L (ref 101–111)
CO2: 24 mmol/L (ref 22–32)
Calcium: 9.2 mg/dL (ref 8.9–10.3)
Creatinine, Ser: 0.74 mg/dL (ref 0.44–1.00)
GFR calc Af Amer: >60 mL/min (ref >60)
GFR calc non Af Amer: >60 mL/min (ref >60)
GLUCOSE: 118 mg/dL — AB (ref 65–99)
POTASSIUM: 4 mmol/L (ref 3.5–5.1)
Sodium: 137 mmol/L (ref 135–145)
Total Bilirubin: 0.5 mg/dL (ref 0.3–1.2)
Total Protein: 7.3 g/dL (ref 6.5–8.1)

## 2016-05-12 LAB — CBC WITH DIFFERENTIAL/PLATELET
BASOS ABS: 0 10*3/uL (ref 0.0–0.1)
BASOS PCT: 0 %
EOS PCT: 2 %
Eosinophils Absolute: 0.1 10*3/uL (ref 0.0–0.7)
HCT: 40.7 % (ref 36.0–46.0)
Hemoglobin: 13.8 g/dL (ref 12.0–15.0)
Lymphocytes Relative: 39 %
Lymphs Abs: 1.9 10*3/uL (ref 0.7–4.0)
MCH: 28.9 pg (ref 26.0–34.0)
MCHC: 33.9 g/dL (ref 30.0–36.0)
MCV: 85.1 fL (ref 78.0–100.0)
MONO ABS: 0.3 10*3/uL (ref 0.1–1.0)
Monocytes Relative: 6 %
Neutro Abs: 2.5 10*3/uL (ref 1.7–7.7)
Neutrophils Relative %: 53 %
PLATELETS: 287 10*3/uL (ref 150–400)
RBC: 4.78 MIL/uL (ref 3.87–5.11)
RDW: 12.6 % (ref 11.5–15.5)
WBC: 4.7 10*3/uL (ref 4.0–10.5)

## 2016-05-12 MED ORDER — SODIUM CHLORIDE 0.9 % IV SOLN
3.0000 mg/kg | INTRAVENOUS | Status: DC
  Administered 2016-05-12: 400 mg via INTRAVENOUS
  Filled 2016-05-12: qty 40

## 2016-05-12 MED ORDER — DIPHENHYDRAMINE HCL 25 MG PO CAPS: 25.0000 mg | ORAL_CAPSULE | ORAL | Status: DC

## 2016-05-12 MED ORDER — ACETAMINOPHEN 325 MG PO TABS
ORAL_TABLET | ORAL | Status: AC
  Filled 2016-05-12: qty 2

## 2016-05-12 MED ORDER — ACETAMINOPHEN 325 MG PO TABS
650.0000 mg | ORAL_TABLET | ORAL | Status: DC
  Administered 2016-05-12: 650 mg via ORAL

## 2016-05-13 NOTE — Progress Notes (Signed)
Received a fax from [place] stating that Lori Valentine future fills for her methotrexate will need to be sent the [facility] specialty pharmacy. They will no loner be covered at her retail pharmacy and if they are she will be penalized with paying the full cost.   Phone: [phone] Fax: [phone]  Will send document to scan center   Abran Duke, CPhT

## 2016-05-18 ENCOUNTER — Other Ambulatory Visit (HOSPITAL_COMMUNITY): Payer: Self-pay | Admitting: *Deleted

## 2016-07-01 ENCOUNTER — Other Ambulatory Visit: Payer: Self-pay | Admitting: Radiology

## 2016-07-07 ENCOUNTER — Ambulatory Visit (HOSPITAL_COMMUNITY)
Admission: RE | Admit: 2016-07-07 | Discharge: 2016-07-07 | Disposition: A | Discharge status: Home / Self Care | Payer: 59 | Source: Ambulatory Visit | Attending: Rheumatology | Admitting: Rheumatology

## 2016-07-07 DIAGNOSIS — M0579 Rheumatoid arthritis with rheumatoid factor of multiple sites without organ or systems involvement: Secondary | ICD-10-CM | POA: Diagnosis present

## 2016-07-07 MED ORDER — DIPHENHYDRAMINE HCL 25 MG PO CAPS: 25.0000 mg | ORAL_CAPSULE | ORAL | Status: DC

## 2016-07-07 MED ORDER — ACETAMINOPHEN 325 MG PO TABS: 650.0000 mg | ORAL_TABLET | ORAL | Status: DC

## 2016-07-07 MED ORDER — SODIUM CHLORIDE 0.9 % IV SOLN
3.0000 mg/kg | INTRAVENOUS | Status: DC
  Administered 2016-07-07: 400 mg via INTRAVENOUS
  Filled 2016-07-07: qty 40

## 2016-07-09 LAB — QUANTIFERON IN TUBE
QFT TB AG MINUS NIL VALUE: 0 IU/mL
QUANTIFERON MITOGEN VALUE: 6.14 IU/mL
QUANTIFERON TB AG VALUE: 0.04 IU/mL
QUANTIFERON TB GOLD: NEGATIVE
Quantiferon Nil Value: 0.04 IU/mL

## 2016-07-09 LAB — QUANTIFERON TB GOLD ASSAY (BLOOD)

## 2016-07-11 NOTE — Progress Notes (Signed)
TB gold negative

## 2016-08-06 ENCOUNTER — Other Ambulatory Visit: Payer: Self-pay | Admitting: Radiology

## 2016-08-06 NOTE — Progress Notes (Signed)
Remicade Infusion orders are current for patient CBC CMP Tylenol Benadryl TB gold not due yet, appointments are up to date and follow up appointment t is scheduled

## 2016-08-24 ENCOUNTER — Telehealth (INDEPENDENT_AMBULATORY_CARE_PROVIDER_SITE_OTHER): Payer: Self-pay | Admitting: Radiology

## 2016-08-24 NOTE — Telephone Encounter (Signed)
FW: Hyalgan 5 or Euflex at 3 both knees ASAP  Due: 1 week ago  Received: 1 week ago  Message Contents  Audrie Lia, RT  Mehreen Azizi, RT        new   Previous Messages    ----- Message -----  From: Tawni Pummel, PA-C  Sent: 05/03/2016  3:37 PM  To: Audrie Lia, RT  Subject: Hyalgan 5 or Euflex at 3 both knees ASAP    Apply for Euflex at 5 or Euflex at 3 bilateral knees ASAP please   Note:  #1: Failed and states  #2: Failed weight loss  #3: X-ray proven severe OA of bilateral knees with severe medial compartment narrowing  #4: Left knee injected with cortisone today (May 03, 2016)       Patient has [employer] ins, will not cover the above, please apply online for Orth[facility]- see me, I will help you get setup and do this.

## 2016-09-01 ENCOUNTER — Ambulatory Visit (HOSPITAL_COMMUNITY)
Admission: RE | Admit: 2016-09-01 | Discharge: 2016-09-01 | Disposition: A | Discharge status: Home / Self Care | Payer: 59 | Source: Ambulatory Visit | Attending: Rheumatology | Admitting: Rheumatology

## 2016-09-01 DIAGNOSIS — M0579 Rheumatoid arthritis with rheumatoid factor of multiple sites without organ or systems involvement: Secondary | ICD-10-CM | POA: Diagnosis present

## 2016-09-01 LAB — COMPREHENSIVE METABOLIC PANEL
ALBUMIN: 3.5 g/dL (ref 3.5–5.0)
ALK PHOS: 67 U/L (ref 38–126)
ALT: 25 U/L (ref 14–54)
ANION GAP: 6 (ref 5–15)
AST: 34 U/L (ref 15–41)
BILIRUBIN TOTAL: 0.8 mg/dL (ref 0.3–1.2)
BUN: 13 mg/dL (ref 6–20)
CALCIUM: 8.3 mg/dL — AB (ref 8.9–10.3)
CO2: 22 mmol/L (ref 22–32)
Chloride: 110 mmol/L (ref 101–111)
Creatinine, Ser: 0.68 mg/dL (ref 0.44–1.00)
GFR calc Af Amer: >60 mL/min (ref >60)
GLUCOSE: 90 mg/dL (ref 65–99)
Potassium: 4.1 mmol/L (ref 3.5–5.1)
Sodium: 138 mmol/L (ref 135–145)
TOTAL PROTEIN: 6.2 g/dL — AB (ref 6.5–8.1)

## 2016-09-01 LAB — CBC WITH DIFFERENTIAL/PLATELET
BASOS PCT: 0 %
Basophils Absolute: 0 10*3/uL (ref 0.0–0.1)
Eosinophils Absolute: 0 10*3/uL (ref 0.0–0.7)
Eosinophils Relative: 1 %
HEMATOCRIT: 36.9 % (ref 36.0–46.0)
HEMOGLOBIN: 12.5 g/dL (ref 12.0–15.0)
LYMPHS ABS: 1.4 10*3/uL (ref 0.7–4.0)
LYMPHS PCT: 43 %
MCH: 28.9 pg (ref 26.0–34.0)
MCHC: 33.9 g/dL (ref 30.0–36.0)
MCV: 85.2 fL (ref 78.0–100.0)
MONOS PCT: 8 %
Monocytes Absolute: 0.2 10*3/uL (ref 0.1–1.0)
NEUTROS ABS: 1.5 10*3/uL — AB (ref 1.7–7.7)
NEUTROS PCT: 48 %
Platelets: 231 10*3/uL (ref 150–400)
RBC: 4.33 MIL/uL (ref 3.87–5.11)
RDW: 12.6 % (ref 11.5–15.5)
WBC: 3.2 10*3/uL — ABNORMAL LOW (ref 4.0–10.5)

## 2016-09-01 MED ORDER — DIPHENHYDRAMINE HCL 25 MG PO CAPS: 25.0000 mg | ORAL_CAPSULE | ORAL | Status: DC

## 2016-09-01 MED ORDER — SODIUM CHLORIDE 0.9 % IV SOLN
3.0000 mg/kg | INTRAVENOUS | Status: DC
  Administered 2016-09-01: 400 mg via INTRAVENOUS
  Filled 2016-09-01: qty 40

## 2016-09-01 MED ORDER — ACETAMINOPHEN 325 MG PO TABS: 650.0000 mg | ORAL_TABLET | ORAL | Status: DC

## 2016-09-01 NOTE — Progress Notes (Signed)
Will repeat CBC at follow-up visit

## 2016-09-06 ENCOUNTER — Telehealth: Payer: Self-pay | Admitting: Rheumatology

## 2016-09-06 NOTE — Telephone Encounter (Signed)
Patient called and wanted to speak with you about an infusion.  If you call her after 12:45, you can call her at work which is [phone] and ask for Lori Valentine.  Her other number is [phone].  Thank you.

## 2016-09-06 NOTE — Telephone Encounter (Signed)
Spoke with patient about her [employer]) for her Remicade infusion. Patient states she has been having problems submitting her EOB and getting reimbursed for her infusions. She states that she received all of her EOB after requesting them from the hospital. After checking her email confirmation she verified that she sent them to the wrong number to [employer]. I gave her the correct fax number [phone]. She sent them via fax to the correct number while we was on the phone. I gave her the phone number to [employer] [phone] and told her to contact them to make sure they received the fax and to let them know she has not yet received her [employer] Mastercard Rebate Card. This is the card she will use at the infusion center.   Patient voiced understanding and denies any questions at this time.  Reaghan Kawa, [place], CPhT 4:11 PM

## 2016-09-06 NOTE — Telephone Encounter (Signed)
Attempted to contact the patient and left message for patient to call the office.  

## 2016-09-09 NOTE — Telephone Encounter (Signed)
Received a fax from [employer] program for Remicade stating that they have received a rebate request for patient. The EOB for 12/09/2015 is being processed. They will contact us and the patient once an outcome has been made.   Spoke with patient to advise her of the letter. She voices understanding and denies any questions at this time.   Will send document to scan center.  Karmon Andis, [place], CPhT 10:22 AM

## 2016-09-20 ENCOUNTER — Telehealth: Payer: Self-pay | Admitting: Rheumatology

## 2016-09-20 NOTE — Telephone Encounter (Signed)
Patient called and is wanting to know that since her appointment had to be rescheduled to a later date, does she need to wait to do lab work after her infusion or what does she need to do.  Thank you.

## 2016-09-21 NOTE — Telephone Encounter (Signed)
Attempted to contact the patient and left message for patient to call the office.  

## 2016-09-23 NOTE — Telephone Encounter (Signed)
Attempted to contact the patient and left message for patient to call the office.  

## 2016-09-24 NOTE — Telephone Encounter (Signed)
Unable to reach multiple attempts made

## 2016-09-30 ENCOUNTER — Ambulatory Visit: Payer: 59 | Admitting: Rheumatology

## 2016-10-22 DIAGNOSIS — F17200 Nicotine dependence, unspecified, uncomplicated: Secondary | ICD-10-CM | POA: Insufficient documentation

## 2016-10-22 DIAGNOSIS — Z8639 Personal history of other endocrine, nutritional and metabolic disease: Secondary | ICD-10-CM | POA: Insufficient documentation

## 2016-10-22 DIAGNOSIS — Z8659 Personal history of other mental and behavioral disorders: Secondary | ICD-10-CM | POA: Insufficient documentation

## 2016-10-22 NOTE — Progress Notes (Deleted)
Office Visit Note  Patient: Lori Valentine             Date of Birth: Jun 09, 1985           MRN: [id number]             PCP: No primary care provider on file. Referring: No ref. provider found Visit Date: 10/29/2016 Occupation: @GUAROCC @    Subjective:  No chief complaint on file.   History of Present Illness: Lori Valentine is a 31 y.o. female ***   Activities of Daily Living:  Patient reports morning stiffness for *** {minute/hour:19697}.   Patient {ACTIONS;DENIES/REPORTS:21021675::"Denies"} nocturnal pain.  Difficulty dressing/grooming: {ACTIONS;DENIES/REPORTS:21021675::"Denies"} Difficulty climbing stairs: {ACTIONS;DENIES/REPORTS:21021675::"Denies"} Difficulty getting out of chair: {ACTIONS;DENIES/REPORTS:21021675::"Denies"} Difficulty using hands for taps, buttons, cutlery, and/or writing: {ACTIONS;DENIES/REPORTS:21021675::"Denies"}   No Rheumatology ROS completed.   PMFS History:  Patient Active Problem List   Diagnosis Date Noted  . History of hypothyroidism 10/22/2016  . History of anxiety and depression 10/22/2016  . Smoker 10/22/2016  . Rheumatoid arthritis (HCC) 05/13/2015  . Atypical chest pain 05/13/2015  . Anxiety and depression 07/29/2014  . Hypothyroidism 07/29/2014    Past Medical History:  Diagnosis Date  . Hypothyroid   . Rheumatoid arthritis (HCC)     Family History  Problem Relation Age of Onset  . Schizophrenia Mother   . Bipolar disorder Mother   . Diabetes Other   . Heart disease Other    No past surgical history on file. Social History   Social History Narrative   17 year old son   Lives with  Sister and nephew and son.   Works as 10 at [employer]        Objective: Vital Signs: There were no vitals taken for this visit.   Physical Exam   Musculoskeletal Exam: ***  CDAI Exam: No CDAI exam completed.    Investigation: Findings:   Labs from 08/02/2013 shows CMP with GFR normal.  CBC with diff is normal.  TB  Gold is negative.  HIV is negative.  IgG, IgA and IgM are negative.  SPEP, M-spike is negative.  CBC Latest Ref Rng & Units 09/01/2016 05/12/2016 03/17/2016  WBC 4.0 - 10.5 K/uL 3.2(L) 4.7 5.6  Hemoglobin 12.0 - 15.0 g/dL 03/19/2016 82.9 56.2  Hematocrit 36.0 - 46.0 % 36.9 40.7 37.7  Platelets 150 - 400 K/uL 231 287 299    CMP Latest Ref Rng & Units 09/01/2016 05/12/2016 03/17/2016  Glucose 65 - 99 mg/dL 90 03/19/2016) 99  BUN 6 - 20 mg/dL 13 13 18   Creatinine 0.44 - 1.00 mg/dL 865(H 8.46  Sodium 135 - 145 mmol/L 138 137 139  Potassium 3.5 - 5.1 mmol/L 4.1 4.0 3.6  Chloride 101 - 111 mmol/L 110 105 108  CO2 22 - 32 mmol/L 22 24 24   Calcium 8.9 - 10.3 mg/dL 8.3(L) 9.2 8.8(L)  Total Protein 6.5 - 8.1 g/dL 6.2(L) 7.3 6.9  Total Bilirubin 0.3 - 1.2 mg/dL 0.8 0.5 0.7  Alkaline Phos 38 - 126 U/L 67 77 67  AST 15 - 41 U/L 34 28 28  ALT 14 - 54 U/L 25 31 32     Imaging: No results found.  Speciality Comments: No specialty comments available.    Procedures:  No procedures performed Allergies: Effexor [venlafaxine]   Assessment / Plan:     Visit Diagnoses: Rheumatoid arthritis involving multiple sites with positive rheumatoid factor (HCC)  High risk medication use - Remicade and Methotrexate (last infusion 09/01/2016 )  Methotrexate not sent in since Jan 2018   History of hypothyroidism  History of anxiety and depression  Smoker    Orders: No orders of the defined types were placed in this encounter.  No orders of the defined types were placed in this encounter.   Face-to-face time spent with patient was *** minutes. 50% of time was spent in counseling and coordination of care.  Follow-Up Instructions: No Follow-up on file.   Kateryna Grantham, RT  Note - This record has been created using [employer].  Chart creation errors have been sought, but may not always  have been located. Such creation errors do not reflect on  the standard of medical care.

## 2016-10-26 ENCOUNTER — Other Ambulatory Visit (HOSPITAL_COMMUNITY): Payer: Self-pay | Admitting: *Deleted

## 2016-10-27 ENCOUNTER — Ambulatory Visit (HOSPITAL_COMMUNITY)
Admission: RE | Admit: 2016-10-27 | Discharge: 2016-10-27 | Disposition: A | Discharge status: Home / Self Care | Payer: 59 | Source: Ambulatory Visit | Attending: Rheumatology | Admitting: Rheumatology

## 2016-10-27 DIAGNOSIS — M0579 Rheumatoid arthritis with rheumatoid factor of multiple sites without organ or systems involvement: Secondary | ICD-10-CM | POA: Insufficient documentation

## 2016-10-27 LAB — COMPREHENSIVE METABOLIC PANEL
ALBUMIN: 3.4 g/dL — AB (ref 3.5–5.0)
ALT: 75 U/L — ABNORMAL HIGH (ref 14–54)
ANION GAP: 7 (ref 5–15)
AST: 84 U/L — ABNORMAL HIGH (ref 15–41)
Alkaline Phosphatase: 77 U/L (ref 38–126)
BUN: 14 mg/dL (ref 6–20)
CO2: 22 mmol/L (ref 22–32)
Calcium: 8.6 mg/dL — ABNORMAL LOW (ref 8.9–10.3)
Chloride: 109 mmol/L (ref 101–111)
Creatinine, Ser: 0.62 mg/dL (ref 0.44–1.00)
GFR calc Af Amer: >60 mL/min (ref >60)
GFR calc non Af Amer: >60 mL/min (ref >60)
GLUCOSE: 92 mg/dL (ref 65–99)
POTASSIUM: 3.6 mmol/L (ref 3.5–5.1)
SODIUM: 138 mmol/L (ref 135–145)
TOTAL PROTEIN: 6.8 g/dL (ref 6.5–8.1)
Total Bilirubin: 0.5 mg/dL (ref 0.3–1.2)

## 2016-10-27 LAB — DIFFERENTIAL
BASOS PCT: 0 %
Basophils Absolute: 0 10*3/uL (ref 0.0–0.1)
EOS ABS: 0.1 10*3/uL (ref 0.0–0.7)
EOS PCT: 2 %
Lymphocytes Relative: 31 %
Lymphs Abs: 1.6 10*3/uL (ref 0.7–4.0)
Monocytes Absolute: 0.2 10*3/uL (ref 0.1–1.0)
Monocytes Relative: 4 %
NEUTROS PCT: 63 %
Neutro Abs: 3.2 10*3/uL (ref 1.7–7.7)

## 2016-10-27 LAB — CBC
HCT: 38 % (ref 36.0–46.0)
Hemoglobin: 13.2 g/dL (ref 12.0–15.0)
MCH: 29.3 pg (ref 26.0–34.0)
MCHC: 34.7 g/dL (ref 30.0–36.0)
MCV: 84.4 fL (ref 78.0–100.0)
PLATELETS: 296 10*3/uL (ref 150–400)
RBC: 4.5 MIL/uL (ref 3.87–5.11)
RDW: 12.4 % (ref 11.5–15.5)
WBC: 5.1 10*3/uL (ref 4.0–10.5)

## 2016-10-27 MED ORDER — DIPHENHYDRAMINE HCL 25 MG PO CAPS: 25.0000 mg | ORAL_CAPSULE | ORAL | Status: DC

## 2016-10-27 MED ORDER — SODIUM CHLORIDE 0.9 % IV SOLN
3.0000 mg/kg | INTRAVENOUS | Status: AC
  Administered 2016-10-27: 300 mg via INTRAVENOUS
  Filled 2016-10-27: qty 30

## 2016-10-27 MED ORDER — ACETAMINOPHEN 325 MG PO TABS: 650.0000 mg | ORAL_TABLET | ORAL | Status: DC

## 2016-10-27 NOTE — Progress Notes (Signed)
Patient has not been seen in the office since January 2018. Please reschedule an appointment. If she is taking methotrexate she will have to stop that and repeat labs in 2 weeks. She should not be taking any anti-inflammatories or alcohol.

## 2016-10-29 ENCOUNTER — Ambulatory Visit: Payer: 59 | Admitting: Rheumatology

## 2016-11-03 ENCOUNTER — Telehealth: Payer: Self-pay | Admitting: *Deleted

## 2016-11-03 NOTE — Telephone Encounter (Signed)
-----   Message from Pollyann Savoy, MD sent at 10/27/2016  1:25 PM EDT ----- Patient has not been seen in the office since January 2018. Please reschedule an appointment. If she is taking methotrexate she will have to stop that and repeat labs in 2 weeks. She should not be taking any anti-inflammatories or alcohol.

## 2016-11-03 NOTE — Telephone Encounter (Signed)
Patient states she can no longer afford the infusions because of the out of pocket cost for the infusion. Patient states she one date of service is $800. Patient states the medication is covered but her part to be covered at the hospital is more than she can afford. Patient has been scheduled for an appointment on 12/01/16 to discuss treatment options.

## 2016-11-03 NOTE — Telephone Encounter (Signed)
Patient states she has not been using alcohol. Patient  has been using Ibuprofen and Aleve. Patient want to know what she can use for pain until her appointment which 12/01/16. Please advise.

## 2016-11-03 NOTE — Telephone Encounter (Signed)
She should come off all anti-inflammatories due to elevation of her LFTs. LFTs were normal while she was on methotrexate. We will have to discuss other treatment options when she comes back.

## 2016-11-04 NOTE — Telephone Encounter (Signed)
Left detailed message as patient requested with recommendations for patient.Advised patient to discontinue all anti inflammatories so we can evaluate her LFTs. LFT ave top be normal to restart MTX or to start any other medication we may need to start for her.

## 2016-11-29 DIAGNOSIS — Z79899 Other long term (current) drug therapy: Secondary | ICD-10-CM | POA: Insufficient documentation

## 2016-11-29 NOTE — Progress Notes (Signed)
Office Visit Note  Patient: Lori Valentine             Date of Birth: [birth year]           MRN: [id number]             PCP: No primary care provider on file. Referring: No ref. provider found Visit Date: 12/01/2016 Occupation: @GUAROCC @    Subjective:  Joint pain and swelling.   History of Present Illness: Lori Valentine is a 31 y.o. female with history of sero positive rheumatoid arthritis. According to her she's been having increased joint pain and swelling recently. She states that Remicade does not last until 8 weeks and she started having pain and swelling. Also her copayment has been very high and she would not be able to afford the medication. They are usually get up and ambulatory services detected but toenail forget everything otherwise she stopped her methotrexate and August 2017 due to increased nausea and has not been taking methotrexate since then. She states the last month prior to her lab work she was taking increased dose of anti-inflammatories due to increased joint pain and swelling. She believes her elevated LFTs in July were related to increase intake of NSAIDs. She's been having increased pain and swelling currently. She describes pain and swelling in her hands , bilateral shoulders, bilateral hip joints, bilateral knee joints and bilateral feet.   Activities of Daily Living:   Patient reports morning stiffness for 2 hours.   Patient Reports nocturnal pain.  Difficulty dressing/grooming: Reports Difficulty climbing stairs: Reports Difficulty getting out of chair: Reports Difficulty using hands for taps, buttons, cutlery, and/or writing: Reports   Review of Systems  Constitutional: Positive for fatigue. Negative for night sweats, weight gain, weight loss and weakness.  HENT: Positive for mouth dryness. Negative for mouth sores, trouble swallowing, trouble swallowing and nose dryness.        Due to meds  Eyes: Negative for pain, redness, visual disturbance and dryness.    Respiratory: Negative for cough, shortness of breath and difficulty breathing.   Cardiovascular: Negative for chest pain, palpitations, hypertension, irregular heartbeat and swelling in legs/feet.  Gastrointestinal: Negative for blood in stool, constipation and diarrhea.  Endocrine: Negative for increased urination.  Genitourinary: Negative for vaginal dryness.  Musculoskeletal: Positive for arthralgias, joint pain, joint swelling and morning stiffness. Negative for myalgias, muscle weakness, muscle tenderness and myalgias.  Skin: Negative for color change, rash, hair loss, skin tightness, ulcers and sensitivity to sunlight.  Allergic/Immunologic: Negative for susceptible to infections.  Neurological: Negative for dizziness, memory loss and night sweats.  Hematological: Negative for swollen glands.  Psychiatric/Behavioral: Negative for depressed mood and sleep disturbance. The patient is not nervous/anxious.     PMFS History:  Patient Active Problem List   Diagnosis Date Noted  . High risk medication use 11/29/2016  . History of hypothyroidism 10/22/2016  . History of anxiety and depression 10/22/2016  . Smoker 10/22/2016  . Rheumatoid arthritis (HCC) 05/13/2015  . Atypical chest pain 05/13/2015  . Anxiety and depression 07/29/2014  . Hypothyroidism 07/29/2014    Past Medical History:  Diagnosis Date  . Hypothyroid   . Rheumatoid arthritis (HCC)     Family History  Problem Relation Age of Onset  . Schizophrenia Mother   . Bipolar disorder Mother   . Diabetes Other   . Heart disease Other    History reviewed. No pertinent surgical history. Social History   Social History Narrative   77 year old  son   Lives with  Sister and nephew and son.   Works as Sales executive at [employer]        Objective: Vital Signs: BP (!) 140/98   Pulse 95   Resp 16   Ht 5\' 8"  (1.727 m)   LMP 12/01/2016    Physical Exam  Constitutional: She is oriented to person, place, and  time. She appears well-developed and well-nourished.  HENT:  Head: Normocephalic and atraumatic.  Eyes: Conjunctivae and EOM are normal.  Neck: Normal range of motion.  Cardiovascular: Normal rate, regular rhythm, normal heart sounds and intact distal pulses.   Pulmonary/Chest: Effort normal and breath sounds normal.  Abdominal: Soft. Bowel sounds are normal.  Lymphadenopathy:    She has no cervical adenopathy.  Neurological: She is alert and oriented to person, place, and time.  Skin: Skin is warm and dry. Capillary refill takes less than 2 seconds.  Psychiatric: She has a normal mood and affect. Her behavior is normal.  Nursing note and vitals reviewed.    Musculoskeletal Exam: C-spine and thoracic lumbar spine good range of motion. She is painful limited range of motion of her right shoulder. She has synovitis over bilateral wrists joint MCPs and PIPs as described below. She swelling of her bilateral knee joints and ankle joints and tenderness across her MTP joints. Cocaine is sexually time for her to have repeat x-rays to now because she has such bad arthritis I think  CDAI Exam: CDAI Homunculus Exam:   Tenderness:  RUE: glenohumeral and wrist LUE: wrist Right hand: 2nd MCP, 3rd MCP, 2nd PIP and 3rd PIP Left hand: 2nd MCP RLE: tibiofemoral and tibiotalar LLE: tibiofemoral and tibiotalar  Swelling:  RUE: glenohumeral and wrist LUE: wrist Right hand: 2nd MCP, 3rd MCP, 2nd PIP and 3rd PIP Left hand: 2nd MCP RLE: tibiofemoral and tibiotalar LLE: tibiofemoral and tibiotalar  Joint Counts:  CDAI Tender Joint count: 10 CDAI Swollen Joint count: 10  Global Assessments:  Patient Global Assessment: 7 Provider Global Assessment: 7  CDAI Calculated Score: 34    Investigation: Findings:  TB gold negative 07/07/2016  CBC Latest Ref Rng & Units 10/27/2016 09/01/2016 05/12/2016  WBC 4.0 - 10.5 K/uL 5.1 3.2(L) 4.7  Hemoglobin 12.0 - 15.0 g/dL 74.8 27.0 78.6  Hematocrit 36.0 -  46.0 % 38.0 36.9 40.7  Platelets 150 - 400 K/uL 296 231 287   CMP Latest Ref Rng & Units 10/27/2016 09/01/2016 05/12/2016  Glucose 65 - 99 mg/dL 92 90 754(G)  BUN 6 - 20 mg/dL 14 13 13   Creatinine 0.44 - 1.00 mg/dL 9.20 1.00 7.12  Sodium 135 - 145 mmol/L 138 138 137  Potassium 3.5 - 5.1 mmol/L 3.6 4.1 4.0  Chloride 101 - 111 mmol/L 109 110 105  CO2 22 - 32 mmol/L 22 22 24   Calcium 8.9 - 10.3 mg/dL 1.9(X) 8.3(L) 9.2  Total Protein 6.5 - 8.1 g/dL 6.8 5.8(I) 7.3  Total Bilirubin 0.3 - 1.2 mg/dL 0.5 0.8 0.5  Alkaline Phos 38 - 126 U/L 77 67 77  AST 15 - 41 U/L 84(H) 34 28  ALT 14 - 54 U/L 75(H) 25 31     Imaging: Xr Foot 2 Views Left  Result Date: 12/01/2016 Juxta articular osteopenia subluxation of some of the MTPs, possible erosion in her fifth MTP all PIP/DIP narrowing noted. No intertarsal joint space narrowing was noted. Impression: These findings are consistent with rheumatoid arthritis  Xr Foot 2 Views Right  Result Date: 12/01/2016 Juxta  articular osteopenia narrowing and subluxation of MTP joints. Erosive changes noted in right third fourth and fifth MTPs and right first PIP joint. No significant intertarsal joint space narrowing was noted. Impression: Findings are consistent with rheumatoid arthritis with erosive changes  Xr Hand 2 View Left  Result Date: 12/01/2016 Juxta articular osteopenia noted second and third MCP joint narrowing noted. All PIP/DIP joint space narrowing was noted. Mild intercarpal and radiocarpal joint space narrowing was noted. No erosive changes were noted. Impression: These findings are consistent with rheumatoid arthritis   Xr Hand 2 View Right  Result Date: 12/01/2016 Juxta articular osteopenia was noted. PIP/DIP narrowing was noted. Narrowing of all MCP joints was noted. No erosive changes were noted. Mild radiocarpal joint space narrowing was noted. Impression: Findings are consistent with rheumatoid arthritis.   Speciality Comments: No specialty  comments available.    Procedures:  No procedures performed Allergies: Effexor [venlafaxine]   Assessment / Plan:     Visit Diagnoses: Rheumatoid arthritis involving multiple sites with positive rheumatoid factor (HCC) - severe erosive disease -patient is having severe flare with pain and swelling in multiple joints. She has failed Enbrel and Remicade now. She also discontinued methotrexate in the past due to side effects and inadequate response. 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 After discussing different treatment options and their side effects we decided to proceed with [name]. Handout was given and informed consent was obtained. Her recent labs showed elevated LFTs which she relates to taking too many anti-inflammatories. I'll repeat her labs tomorrow along with fasting lipid panel. If her labs are normal we will start her on consultants. Her [name] was approved through the insurance so we can give her samples before she gets a prescription. The plan is to check her labs in a month and then every 2 months to monitor for drug toxicity. She will also need fasting lipid panel repeated in 2 months after starting [name]. As she is having a lot of pain and swelling have also given her prednisone 10 mg by mouth daily for 2 weeks and taper by 2.5 mg every 2 weeks. It will work as bridging therapy until [name] starts working 21 document any breast.  Plan: Lipid Profile, COMPLETE METABOLIC PANEL WITH GFR, XR Hand 2 View Right, XR Hand 2 View Left, XR Foot 2 Views Left, XR Foot 2 Views Right  High risk medication use - Remicade IV 3mg / Kg inadequate response, MTX 8 tabs po q week( dcd by Pt. due to nausea 11/2015), Folic acid 2mg  po qd( Enbrel - inadq response)  Elevated LFTs - -10/2016 - Plan: Lipid Profile, COMPLETE METABOLIC PANEL WITH GFR  History of anxiety and depression  History of hypothyroidism  Smoker: Smoking cessation discussed  Obesity: Patient is on  phentermine now and going to the weight loss clinic. She had some intentional weight loss  Noncompliance - with medications and follow up   Screening for hyperlipidemia - Plan: Lipid Profile  Bilateral hand pain - Plan: XR Hand 2 View Right, XR Hand 2 View Left  Bilateral foot pain - Plan: XR Foot 2 Views Left, XR Foot 2 Views Right    Counseled patient that [name] is a JAK inhibitor indicated for Rheumatoid Arthritis.  Counseled patient on purpose, proper use, and adverse effects of Xeljanz.  Counseled patient that medication is prescribed to take 11 mg XR qd.  Reviewed the most common adverse effects including infection, diarrhea, headaches.  Also reviewed rare adverse effects such  as bowel injury and the need to contact us if she develops stomach pain during treatment.  Reviewed with patient that there is the possibility of an increased risk of malignancy but it is not well understood if this increased risk is due to the medication or the disease state.  Counseled patient to avoid live vaccines while on [place].  Provided patient with medication education material and answered all questions.  Patient consented to [place].  Will upload [name] into patient's chart.       Orders: Orders Placed This Encounter  Procedures  . XR Hand 2 View Right  . XR Hand 2 View Left  . XR Foot 2 Views Left  . XR Foot 2 Views Right  . Lipid Profile  . COMPLETE METABOLIC PANEL WITH GFR   No orders of the defined types were placed in this encounter.   Face-to-face time spent with patient was 40 minutes.Greater than 50% of time was spent in counseling and coordination of care.  Follow-Up Instructions: Return in about 3 months (around 03/03/2017) for Rheumatoid arthritis.   Pollyann Savoy, MD  Note - This record has been created using [employer].  Chart creation errors have been sought, but may not always  have been located. Such creation errors do not reflect on  the standard of medical  care.

## 2016-12-01 ENCOUNTER — Telehealth: Payer: Self-pay | Admitting: Radiology

## 2016-12-01 ENCOUNTER — Ambulatory Visit (INDEPENDENT_AMBULATORY_CARE_PROVIDER_SITE_OTHER): Payer: Self-pay

## 2016-12-01 ENCOUNTER — Ambulatory Visit (INDEPENDENT_AMBULATORY_CARE_PROVIDER_SITE_OTHER): Payer: 59 | Admitting: Rheumatology

## 2016-12-01 ENCOUNTER — Encounter: Payer: Self-pay | Admitting: Rheumatology

## 2016-12-01 ENCOUNTER — Ambulatory Visit (INDEPENDENT_AMBULATORY_CARE_PROVIDER_SITE_OTHER): Payer: 59

## 2016-12-01 ENCOUNTER — Telehealth: Payer: Self-pay

## 2016-12-01 VITALS — BP 140/98 | HR 95 | Resp 16 | Ht 68.0 in

## 2016-12-01 DIAGNOSIS — M79642 Pain in left hand: Secondary | ICD-10-CM

## 2016-12-01 DIAGNOSIS — Z13220 Encounter for screening for lipoid disorders: Secondary | ICD-10-CM | POA: Diagnosis not present

## 2016-12-01 DIAGNOSIS — M79641 Pain in right hand: Secondary | ICD-10-CM

## 2016-12-01 DIAGNOSIS — M79671 Pain in right foot: Secondary | ICD-10-CM | POA: Diagnosis not present

## 2016-12-01 DIAGNOSIS — Z8659 Personal history of other mental and behavioral disorders: Secondary | ICD-10-CM

## 2016-12-01 DIAGNOSIS — Z8639 Personal history of other endocrine, nutritional and metabolic disease: Secondary | ICD-10-CM

## 2016-12-01 DIAGNOSIS — Z9119 Patient's noncompliance with other medical treatment and regimen: Secondary | ICD-10-CM

## 2016-12-01 DIAGNOSIS — R7989 Other specified abnormal findings of blood chemistry: Secondary | ICD-10-CM | POA: Diagnosis not present

## 2016-12-01 DIAGNOSIS — M79672 Pain in left foot: Secondary | ICD-10-CM

## 2016-12-01 DIAGNOSIS — M0579 Rheumatoid arthritis with rheumatoid factor of multiple sites without organ or systems involvement: Secondary | ICD-10-CM

## 2016-12-01 DIAGNOSIS — Z79899 Other long term (current) drug therapy: Secondary | ICD-10-CM | POA: Diagnosis not present

## 2016-12-01 DIAGNOSIS — Z91199 Patient's noncompliance with other medical treatment and regimen due to unspecified reason: Secondary | ICD-10-CM

## 2016-12-01 DIAGNOSIS — F17200 Nicotine dependence, unspecified, uncomplicated: Secondary | ICD-10-CM | POA: Diagnosis not present

## 2016-12-01 DIAGNOSIS — R945 Abnormal results of liver function studies: Secondary | ICD-10-CM

## 2016-12-01 MED ORDER — PREDNISONE 5 MG PO TABS: ORAL_TABLET | ORAL | 0 refills | Status: DC

## 2016-12-01 NOTE — Progress Notes (Signed)
Counseled patient on purpose, proper use, and adverse effects of Xeljanz.  Reviewed the most common adverse effects including infection, diarrhea, headaches.    Counseled patient to avoid live vaccines while on [place].  Provided patient with medication education material and answered all questions.  Patient consented to [place].  Will upload [name] into patient's chart.  Counseled patient this medication is contraindicated during pregnancy, and encouraged strict birth control. Patient states she is not planning any pregnancy and is aware to notify us if she plans pregnancy in the future.   Hep Panel negative on 05/12/2011 TB gold negative 07/07/2016 SPEP / HIV / Immunoglobulins normal 08/02/2013  Lipid panel  Ordered, patient will return tomorrow for this, along with repeat CMP    CBC Latest Ref Rng & Units 10/27/2016 09/01/2016 05/12/2016  WBC 4.0 - 10.5 K/uL 5.1 3.2(L) 4.7  Hemoglobin 12.0 - 15.0 g/dL 33.0 07.6 22.6  Hematocrit 36.0 - 46.0 % 38.0 36.9 40.7  Platelets 150 - 400 K/uL 296 231 287

## 2016-12-01 NOTE — Telephone Encounter (Signed)
Plan is to provide a Xeljanz XR sample and send in Xeljanz XR if LFTs are normal, and after her lipid profile results   To you FYI

## 2016-12-01 NOTE — Patient Instructions (Addendum)
Come in tomorrow for your fasting lab work.   We have open lab Monday through Friday from 8:30-11:30 AM and 1:30-4 PM at the office of Dr. Pollyann Savoy.   The office is located at [address], [place], [place] [address] No appointment is necessary.   Labs are drawn by [facility].  You may receive a bill from [facility] for your lab work. If you have any questions regarding directions or hours of operation,  please call [phone].     Tofacitinib extended-release tablets What is this medicine? TOFACITINIB (TOE fa SYE it nib) is a medicine that works on the immune system. This medicine is used to treat rheumatoid arthritis and psoriatic arthritis. This medicine may be used for other purposes; ask your health care provider or pharmacist if you have questions. COMMON BRAND NAME(S): [name] XR What should I tell my health care provider before I take this medicine? They need to know if you have any of these conditions: -cancer -diabetes -high cholesterol -immune system problems -infection (especially a virus infection such as chickenpox, cold sores, or herpes) -kidney disease -liver disease -low blood counts, like low white cell, platelet, or red cell counts -stomach or intestine problems -an unusual or allergic reaction to tofacitinib, other medicines, foods, dyes, or preservatives -pregnant or trying to get pregnant -breast-feeding How should I use this medicine? Take this medicine by mouth with a glass of water. Follow the directions on the prescription label. Do not cut, crush or chew this medicine. You can take it with or without food. If it upsets your stomach, take it with food. Take your medicine at regular intervals. Do not take it more often than directed. Do not stop taking except on your doctor's advice. A special MedGuide will be given to you by the pharmacist with each prescription and refill. Be sure to read this information carefully each time. Talk to your  pediatrician regarding the use of this medicine in children. Special care may be needed. Overdosage: If you think you have taken too much of this medicine contact a poison control center or emergency room at once. NOTE: This medicine is only for you. Do not share this medicine with others. What if I miss a dose? If you miss a dose, take it as soon as you can. If it is almost time for your next dose, take only that dose. Do not take double or extra doses. What may interact with this medicine? Do not take this medicine with any of the following medications: -azathioprine, cyclosporine, or other immunosuppressive drugs -biologic medicines for arthritis such as abatacept, adalimumab, anakinra, certolizumab, etanercept, golimumab, infliximab, rituximab, secukinumab, tocilizumab, ustekinumab -live vaccines This medicine may also interact with the following medications: -antiviral medicines for hepatitis, HIV or AIDS -certain medicines for fungal infections like fluconazole, itraconazole, ketoconazole, voriconazole -certain medicines for seizures like carbamazepine, phenobarbital, phenytoin -rifampin -supplements, such as St. John's wort This list may not describe all possible interactions. Give your health care provider a list of all the medicines, herbs, non-prescription drugs, or dietary supplements you use. Also tell them if you smoke, drink alcohol, or use illegal drugs. Some items may interact with your medicine. What should I watch for while using this medicine? Tell your doctor or healthcare professional if your symptoms do not start to get better or if they get worse. The tablet shell of this medicine does not dissolve. This is normal. The tablet shell may appear whole in the stool. This is not a cause for  concern. Avoid taking products that contain aspirin, acetaminophen, ibuprofen, naproxen, or ketoprofen unless instructed by your doctor. These medicines may hide a fever. Call your doctor or  health care professional for advice if you get a fever, chills or sore throat, or other symptoms of a cold or flu. Do not treat yourself. This drug decreases your body's ability to fight infections. Try to avoid being around people who are sick. What side effects may I notice from receiving this medicine? Side effects that you should report to your doctor or health care professional as soon as possible: -allergic reactions like skin rash, itching or hives, swelling of the face, lips, or tongue -breathing problems -dizziness -signs of infection - fever or chills, cough, sore throat, pain or trouble passing urine -signs and symptoms of liver injury like dark yellow or brown urine; general ill feeling or flu-like symptoms; light-colored stools; loss of appetite; nausea; right upper belly pain; unusually weak or tired; yellowing of the eyes or skin -stomach pain or a sudden change in bowel habits -unusually weak or tired Side effects that usually do not require medical attention (report to your doctor or health care professional if they continue or are bothersome): -diarrhea -headache -muscle aches -runny nose -sinus trouble This list may not describe all possible side effects. Call your doctor for medical advice about side effects. You may report side effects to FDA at 1-800-FDA-1088. Where should I keep my medicine? Keep out of the reach of children. Store between 20 and 25 degrees C (68 and 77 degrees F). Throw away any unused medicine after the expiration date. NOTE: This sheet is a summary. It may not cover all possible information. If you have questions about this medicine, talk to your doctor, pharmacist, or health care provider.  2018 Elsevier/Gold Standard (2016-04-09 11:55:35)

## 2016-12-01 NOTE — Telephone Encounter (Signed)
Submitted a prior authorization for Xeljanz XR 11mg s to patient's insurance via phone. , [employer] representative states that the authorization has been approved from 12/01/16-12/01/17  Reference number: 12/[id number] Phone number: [phone]  Patient was informed in office of the approval. Waiting on labs to come back before we send in an Rx for her.   Will you send in an Rx for patient once the labs have returned? Thanks  Delroy Ordway, [place], CPhT 3:16 PM

## 2016-12-02 ENCOUNTER — Other Ambulatory Visit: Payer: Self-pay | Admitting: *Deleted

## 2016-12-02 DIAGNOSIS — Z13220 Encounter for screening for lipoid disorders: Secondary | ICD-10-CM

## 2016-12-02 DIAGNOSIS — M0579 Rheumatoid arthritis with rheumatoid factor of multiple sites without organ or systems involvement: Secondary | ICD-10-CM

## 2016-12-02 DIAGNOSIS — R945 Abnormal results of liver function studies: Secondary | ICD-10-CM

## 2016-12-02 DIAGNOSIS — R7989 Other specified abnormal findings of blood chemistry: Secondary | ICD-10-CM

## 2016-12-03 LAB — COMPLETE METABOLIC PANEL WITH GFR
ALBUMIN: 4.1 g/dL (ref 3.6–5.1)
ALK PHOS: 90 U/L (ref 33–115)
ALT: 22 U/L (ref 6–29)
AST: 19 U/L (ref 10–30)
BILIRUBIN TOTAL: 0.3 mg/dL (ref 0.2–1.2)
BUN: 18 mg/dL (ref 7–25)
CO2: 20 mmol/L (ref 20–32)
CREATININE: 0.62 mg/dL (ref 0.50–1.10)
Calcium: 8.9 mg/dL (ref 8.6–10.2)
Chloride: 105 mmol/L (ref 98–110)
GLUCOSE: 103 mg/dL — AB (ref 65–99)
Potassium: 4.4 mmol/L (ref 3.5–5.3)
SODIUM: 136 mmol/L (ref 135–146)
TOTAL PROTEIN: 7.2 g/dL (ref 6.1–8.1)

## 2016-12-03 LAB — LIPID PANEL
CHOLESTEROL: 211 mg/dL — AB (ref <200)
HDL: 39 mg/dL — AB (ref >50)
LDL Cholesterol: 158 mg/dL — ABNORMAL HIGH (ref <100)
TRIGLYCERIDES: 71 mg/dL (ref <150)
Total CHOL/HDL Ratio: 5.4 Ratio — ABNORMAL HIGH (ref <5.0)
VLDL: 14 mg/dL (ref <30)

## 2016-12-03 MED ORDER — TOFACITINIB CITRATE ER 11 MG PO TB24: 11.0000 mg | ORAL_TABLET | Freq: Every day | ORAL | 2 refills | Status: DC

## 2016-12-03 NOTE — Telephone Encounter (Signed)
Labs resulted and prescription sent to the pharmacy per [name]

## 2016-12-03 NOTE — Progress Notes (Signed)
Abnormal lipid panel. other  labs are normal. Please notify patient and fax results to her PCP.

## 2016-12-07 ENCOUNTER — Telehealth: Payer: Self-pay

## 2016-12-07 NOTE — Telephone Encounter (Signed)
Spoke with patient in regards to lab results and advised her to contact her PCP to discuss abnormal lipid panel. Patient states she will make an appointment with PCP to discuss results. Patient will come to office to pick up sample of Xeljanz.

## 2016-12-07 NOTE — Telephone Encounter (Signed)
Patient returning call from last week regarding her labs. Will you call patient back on her work phone. #[phone]  Thanks!  Manolito Jurewicz, [place], CPhT 9:46 AM

## 2016-12-29 ENCOUNTER — Encounter: Payer: Self-pay | Admitting: Internal Medicine

## 2016-12-29 ENCOUNTER — Ambulatory Visit (INDEPENDENT_AMBULATORY_CARE_PROVIDER_SITE_OTHER): Payer: 59 | Admitting: Internal Medicine

## 2016-12-29 VITALS — BP 118/62 | HR 86 | Temp 98.6°F | Ht 68.0 in | Wt 243.0 lb

## 2016-12-29 DIAGNOSIS — Z113 Encounter for screening for infections with a predominantly sexual mode of transmission: Secondary | ICD-10-CM | POA: Diagnosis not present

## 2016-12-29 DIAGNOSIS — M0579 Rheumatoid arthritis with rheumatoid factor of multiple sites without organ or systems involvement: Secondary | ICD-10-CM | POA: Diagnosis not present

## 2016-12-29 DIAGNOSIS — Z23 Encounter for immunization: Secondary | ICD-10-CM

## 2016-12-29 DIAGNOSIS — N765 Ulceration of vagina: Secondary | ICD-10-CM

## 2016-12-29 DIAGNOSIS — E039 Hypothyroidism, unspecified: Secondary | ICD-10-CM | POA: Diagnosis not present

## 2016-12-29 DIAGNOSIS — Z01419 Encounter for gynecological examination (general) (routine) without abnormal findings: Secondary | ICD-10-CM | POA: Insufficient documentation

## 2016-12-29 DIAGNOSIS — F411 Generalized anxiety disorder: Secondary | ICD-10-CM | POA: Insufficient documentation

## 2016-12-29 DIAGNOSIS — F17200 Nicotine dependence, unspecified, uncomplicated: Secondary | ICD-10-CM

## 2016-12-29 NOTE — Assessment & Plan Note (Addendum)
Smokes about half a pack a  Day Not currently interested in stopping

## 2016-12-29 NOTE — Progress Notes (Signed)
   [facility] [name], MD Phone: [phone]  Reason For Visit: New Patient to Establish Care  #  Hypothyroidism  -  2012 diagnosis. Took medication for about 6 months and then on and off since then. Has used mother medications in the past at times as she is also on Synthroid  -  Denies fatigue, cold intolerance, weight gain, constipation, dry skin, myalgia, and menstrual irregularities  #Rheumatiod Arthitis - Patient sees Dr. Zenaida Deed  - has been taking [name]  - previously was taking Methotrexate and Remicade    # Obesity   - Patient has been taking Phentermine for weight loss at bariatric center  - Patient denies any side effects associated with this medication   # Genital ulcer - Found out her partner was unfaithful. - No vaginal discharge or irritation or other pelvic symptoms  - Couple months ago had a small genital ulcer as well as some fever and fatigue, this has since resolved  - Would like to have a workup of this though genital ulcer is not present at this time. - has not had a Pap smear and a significant amount of time, does not remember any previous abnormality  Past Medical History Reviewed problem list.  Medications- reviewed and updated No additions to family history Social history- patient is a smoker  Objective: BP 118/62   Pulse 86   Temp 98.6 F (37 C) (Oral)   Ht 5\' 8"  (1.727 m)   Wt 243 lb (110.2 kg)   LMP 12/01/2016   SpO2 99%   BMI 36.95 kg/m  Gen: NAD, alert, cooperative with exam HEENT: Normal    Neck: No masses palpated. No lymphadenopathy    Ears: Tympanic membranes intact, normal light reflex, no erythema, no bulging    Eyes: PERRLA, EOMI    Nose: nasal turbinates moist    Throat: moist mucus membranes, no erythema Cardio: regular rate and rhythm, S1S2 heard, no murmurs appreciated Pulm: clear to auscultation bilaterally, no wheezes, rhonchi or rales GI: soft, non-tender, non-distended, bowel sounds  present, no hepatomegaly, no splenomegaly Extremities: warm, well perfused, No edema, cyanosis or clubbing;  MSK: Normal gait and station Skin: dry, intact, no rashes or lesions Neuro: Strength and sensation grossly intact  Patient reports no  vision/ hearing changes,anorexia, weight change, fever ,adenopathy, persistant / recurrent hoarseness, swallowing issues, chest pain, edema,persistant / recurrent cough, hemoptysis, dyspnea(rest, exertional, paroxysmal nocturnal), gastrointestinal  bleeding (melena, rectal bleeding), abdominal pain, excessive heart burn, GU symptoms(dysuria, hematuria, pyuria, voiding/incontinence  Issues) syncope, focal weakness, severe memory loss, concerning skin lesions, depression, anxiety, abnormal bruising/bleeding, major joint swelling, breast masses or abnormal vaginal bleeding.    Assessment/Plan: See problem based a/p  Smoker Smokes about half a pack a  Day Not currently interested in stopping   Hypothyroidism Not currently on any medications. Has not had this investigated in several years. Will obtain a TSH and T4  Rheumatoid arthritis (HCC) Follows with rheumatology   Vaginal ulcer Hx of vaginal ulcer, not currently present. Will check some initial lab work. Will check G/C at next appointment for pap smear. No vaginal discharge or irritation  - HIV antibody - RPR - HSV 2 antibody, IgG

## 2016-12-29 NOTE — Progress Notes (Deleted)
   [facility] [name], MD Phone: [phone]  Reason For Visit:   # *** -   Past Medical History Reviewed problem list.  Medications- reviewed and updated No additions to family history Social history- patient is a *** smoker  Objective: Ht 5\' 8"  (1.727 m)   Wt 243 lb (110.2 kg)   LMP 12/01/2016   BMI 36.95 kg/m  Gen: NAD, alert, cooperative with exam HEENT: Normal    Neck: No masses palpated. No lymphadenopathy    Ears: Tympanic membranes intact, normal light reflex, no erythema, no bulging    Eyes: PERRLA, EOMI    Nose: nasal turbinates moist    Throat: moist mucus membranes, no erythema Cardio: regular rate and rhythm, S1S2 heard, no murmurs appreciated Pulm: clear to auscultation bilaterally, no wheezes, rhonchi or rales GI: soft, non-tender, non-distended, bowel sounds present, no hepatomegaly, no splenomegaly GU: external vaginal tissue ***, cervix ***, *** punctate lesions on cervix appreciated, *** discharge from cervical os, *** bleeding, *** cervical motion tenderness, *** abdominal/ adnexal masses Extremities: warm, well perfused, No edema, cyanosis or clubbing;  MSK: Normal gait and station Skin: dry, intact, no rashes or lesions Neuro: Strength and sensation grossly intact   Assessment/Plan: See problem based a/p  No problem-specific Assessment & Plan notes found for this encounter.

## 2016-12-29 NOTE — Patient Instructions (Addendum)
Please return one week for a Pap smear, and evaluation of your thyroid. We will do further workup at that point in time and consider treatment as needed

## 2016-12-30 ENCOUNTER — Ambulatory Visit: Payer: 59 | Admitting: Rheumatology

## 2016-12-30 LAB — T4, FREE: Free T4: 1.02 ng/dL (ref 0.82–1.77)

## 2016-12-30 LAB — RPR: RPR: NONREACTIVE

## 2016-12-30 LAB — HSV 2 ANTIBODY, IGG

## 2016-12-30 LAB — HIV ANTIBODY (ROUTINE TESTING W REFLEX): HIV Screen 4th Generation wRfx: NONREACTIVE

## 2016-12-30 LAB — TSH: TSH: 8.4 u[IU]/mL — AB (ref 0.450–4.500)

## 2017-01-03 NOTE — Assessment & Plan Note (Signed)
Not currently on any medications. Has not had this investigated in several years. Will obtain a TSH and T4

## 2017-01-03 NOTE — Assessment & Plan Note (Addendum)
Hx of vaginal ulcer, not currently present. Will check some initial lab work. Will check G/C at next appointment for pap smear. No vaginal discharge or irritation  - HIV antibody - RPR - HSV 2 antibody, IgG

## 2017-01-03 NOTE — Assessment & Plan Note (Signed)
Follows with rheumatology. 

## 2017-01-04 ENCOUNTER — Telehealth: Payer: Self-pay | Admitting: Rheumatology

## 2017-01-04 NOTE — Telephone Encounter (Signed)
Patient has started a new treatment, Xeljanx. Patient unable to continue with medication. Please call to advise.

## 2017-01-04 NOTE — Telephone Encounter (Signed)
Left message on answering machine for patient to call back. Patient cannot take [facility] while she is pregnant. The only medication which is approved during pregnancy as Plaquenil or sulfasalazine. Most the patient's do well during pregnancy and do not require any medication. If she has ongoing joint swelling and she will need appointments to discuss treatment options.

## 2017-01-04 NOTE — Telephone Encounter (Signed)
Patient states the medication is working well. But states she recently found out she is pregnant. Patient states she stopped taking it 3 days ago when she found out she was pregnant. Patient would like to know what she should doe as far as her medication and her rheumatoid arthritis. Please advise.

## 2017-01-05 ENCOUNTER — Telehealth: Payer: Self-pay | Admitting: Internal Medicine

## 2017-01-05 NOTE — Telephone Encounter (Signed)
I spoke with patient. She is asymptomatic currently. I discussed with her that many patients are asymptomatic during the pregnancy. She will have to reinitiate her medications after the delivery. If she develops any symptoms while she is pregnant she supposed to notify us. I discussed the option of possible use of Plaquenil/sulfasalazine/Cimzia. She has failed Enbrel, Plaquenil, Remicade and methotrexate in the past per patient.

## 2017-01-05 NOTE — Telephone Encounter (Signed)
Please call Ms. Ebling and let her know her results were all negative. Her thyroid enzyme is elevated, however we will discuss that at her next visit.

## 2017-01-06 NOTE — Telephone Encounter (Signed)
Left message for patient to return call.

## 2017-01-11 ENCOUNTER — Other Ambulatory Visit (HOSPITAL_COMMUNITY)
Admission: RE | Admit: 2017-01-11 | Discharge: 2017-01-11 | Disposition: A | Discharge status: Home / Self Care | Payer: 59 | Source: Ambulatory Visit | Attending: Family Medicine | Admitting: Family Medicine

## 2017-01-11 ENCOUNTER — Encounter: Payer: Self-pay | Admitting: Internal Medicine

## 2017-01-11 ENCOUNTER — Ambulatory Visit (INDEPENDENT_AMBULATORY_CARE_PROVIDER_SITE_OTHER): Payer: 59 | Admitting: Internal Medicine

## 2017-01-11 VITALS — BP 112/80 | HR 89 | Temp 97.7°F | Ht 68.0 in | Wt 239.0 lb

## 2017-01-11 DIAGNOSIS — Z3201 Encounter for pregnancy test, result positive: Secondary | ICD-10-CM | POA: Diagnosis not present

## 2017-01-11 DIAGNOSIS — N912 Amenorrhea, unspecified: Secondary | ICD-10-CM

## 2017-01-11 DIAGNOSIS — Z124 Encounter for screening for malignant neoplasm of cervix: Secondary | ICD-10-CM | POA: Insufficient documentation

## 2017-01-11 DIAGNOSIS — A749 Chlamydial infection, unspecified: Secondary | ICD-10-CM

## 2017-01-11 DIAGNOSIS — Z01419 Encounter for gynecological examination (general) (routine) without abnormal findings: Secondary | ICD-10-CM

## 2017-01-11 DIAGNOSIS — E039 Hypothyroidism, unspecified: Secondary | ICD-10-CM

## 2017-01-11 LAB — POCT URINE PREGNANCY: PREG TEST UR: POSITIVE — AB

## 2017-01-11 MED ORDER — PRENATAL ADULT GUMMY/DHA/FA 0.4-25 MG PO CHEW: 1.0000 | CHEWABLE_TABLET | Freq: Every day | ORAL | 12 refills | Status: DC

## 2017-01-11 NOTE — Patient Instructions (Signed)
I am going to recheck your TSH and T4 today. I will let you know the results of your Pap smear. In going to place a referral for you to be seen by OB/GYN. I have started you on prenatal gumies.

## 2017-01-11 NOTE — Progress Notes (Deleted)
   [facility] [name], MD Phone: [phone]  Reason For Visit:   # *** -   Past Medical History Reviewed problem list.  Medications- reviewed and updated No additions to family history Social history- patient is a *** smoker  Objective: BP 112/80   Pulse 89   Temp 97.7 F (36.5 C) (Oral)   Ht 5\' 8"  (1.727 m)   Wt 239 lb (108.4 kg)   LMP  (LMP Unknown)   SpO2 99%   BMI 36.34 kg/m  Gen: NAD, alert, cooperative with exam HEENT: Normal    Neck: No masses palpated. No lymphadenopathy    Ears: Tympanic membranes intact, normal light reflex, no erythema, no bulging    Eyes: PERRLA, EOMI    Nose: nasal turbinates moist    Throat: moist mucus membranes, no erythema Cardio: regular rate and rhythm, S1S2 heard, no murmurs appreciated Pulm: clear to auscultation bilaterally, no wheezes, rhonchi or rales GI: soft, non-tender, non-distended, bowel sounds present, no hepatomegaly, no splenomegaly GU: external vaginal tissue ***, cervix ***, *** punctate lesions on cervix appreciated, *** discharge from cervical os, *** bleeding, *** cervical motion tenderness, *** abdominal/ adnexal masses Extremities: warm, well perfused, No edema, cyanosis or clubbing;  MSK: Normal gait and station Skin: dry, intact, no rashes or lesions Neuro: Strength and sensation grossly intact   Assessment/Plan: See problem based a/p  No problem-specific Assessment & Plan notes found for this encounter.

## 2017-01-11 NOTE — Progress Notes (Signed)
   [facility] Lori Chars, MD Phone: [phone]  Reason For Visit: Follow up   # Follow up for Well [facility]  -Patient presenting for a Pap smear -Patient denies having any abnormal Pap smears previously -Patient does breast exams every once in a while - Patient denies any vaginal discharge or irritation -Patient has not been recently sexually active last sexual activity was in September -Pregnancy test and was positive -believe she maybe [redacted] weeks along. However she is not completely sure, as last LMP was unusally light.  Wants to keep the baby  - Has not started prenatal vitamins  - Has stopped smoking and EtOH   #Hypothyroidism - Recent TSH elevated by T4 wnl  - Likely subclinical hypothyroidism  - Has been taking any synthroid in over 6 months   Past Medical History Reviewed problem list.  Medications- reviewed and updated No additions to family history Social history- patient is a non- smoker  Objective: BP 112/80   Pulse 89   Temp 97.7 F (36.5 C) (Oral)   Ht 5\' 8"  (1.727 m)   Wt 239 lb (108.4 kg)   LMP  (LMP Unknown)   SpO2 99%   BMI 36.34 kg/m  Gen: NAD, alert, cooperative with exam Cardio: regular rate and rhythm, S1S2 heard, no murmurs appreciated GU: external vaginal tissue wnl, cervix wnl, no punctate lesions on cervix appreciated, slight off white discharge from cervical os, no bleeding, no cervical motion tenderness, no abdominal/ adnexal masses Skin: dry, intact, no rashes or lesions   Assessment/Plan: See problem based a/p  Positive pregnancy test - Possibly six weeks pregnant - Started prenatal vitamins  - POCT urine pregnancy - Stopped Methotrexate  - Ambulatory referral to Obstetrics / Gynecology - high risk given hx of hypothyroidisms   Hypothyroidism Recheck TSH/T4  Has subclinical hypothyroidism  Will contact OBGYN to see what recommendations until they see the patient Discussed with Dr.   Women's  annual routine gynecological examination Will obtain pap smears  Will screen for G/C Previously screen for HIV and RPR which were both negative

## 2017-01-12 LAB — CYTOLOGY - PAP
Chlamydia: POSITIVE — AB
Diagnosis: NEGATIVE
HPV: NOT DETECTED
NEISSERIA GONORRHEA: NEGATIVE
Trichomonas: NEGATIVE

## 2017-01-12 LAB — TSH: TSH: 7.9 u[IU]/mL — AB (ref 0.450–4.500)

## 2017-01-12 LAB — T4: T4, Total: 7.8 ug/dL (ref 4.5–12.0)

## 2017-01-13 DIAGNOSIS — A749 Chlamydial infection, unspecified: Secondary | ICD-10-CM | POA: Insufficient documentation

## 2017-01-13 MED ORDER — PRENATAL ADULT GUMMY/DHA/FA 0.4-25 MG PO CHEW: 1.0000 | CHEWABLE_TABLET | Freq: Every day | ORAL | 12 refills | Status: DC

## 2017-01-13 MED ORDER — AZITHROMYCIN 500 MG PO TABS: 1000.0000 mg | ORAL_TABLET | Freq: Every day | ORAL | 0 refills | Status: DC

## 2017-01-14 NOTE — Assessment & Plan Note (Addendum)
-   Possibly six weeks pregnant - Started prenatal vitamins  - POCT urine pregnancy - Stopped Methotrexate  - Ambulatory referral to Obstetrics / Gynecology - high risk given hx of hypothyroidisms

## 2017-01-14 NOTE — Assessment & Plan Note (Addendum)
Recheck TSH/T4  Has subclinical hypothyroidism  Will contact OBGYN to see what recommendations until they see the patient Discussed with Dr. Deirdre Priest

## 2017-01-14 NOTE — Assessment & Plan Note (Signed)
Will obtain pap smears  Will screen for G/C Previously screen for HIV and RPR which were both negative

## 2017-01-16 ENCOUNTER — Telehealth: Payer: Self-pay | Admitting: Internal Medicine

## 2017-01-16 NOTE — Telephone Encounter (Signed)
Called patient to let her know she was positive for chlamydia. Provided Azithromycin. Patient to take this. Will follow up with OBGYN for test of cure, given patient is pregnant.

## 2017-01-17 ENCOUNTER — Telehealth: Payer: Self-pay

## 2017-01-17 DIAGNOSIS — O3680X0 Pregnancy with inconclusive fetal viability, not applicable or unspecified: Secondary | ICD-10-CM

## 2017-01-17 MED ORDER — AZITHROMYCIN 500 MG PO TABS: 1000.0000 mg | ORAL_TABLET | Freq: Once | ORAL | 0 refills | Status: AC

## 2017-01-17 NOTE — Telephone Encounter (Addendum)
Pt called the front office requesting to have an appt for NEW OB.  Pt stated that her provider at [facility] told her that she was high risk and that she needed to be seen.  I asked pt when her LMP.  Pt stated that she was unsure because she had a two day period in August.  Notified Dr. Adrian Blackwater, provider agreed with [facility] [place] for viability and dating.  OB US scheduled for October 19th @ 1245.  Pt notified of appt.   Pt stated "thank you" with no further questions.

## 2017-01-18 ENCOUNTER — Telehealth: Payer: Self-pay | Admitting: Internal Medicine

## 2017-01-18 DIAGNOSIS — E039 Hypothyroidism, unspecified: Secondary | ICD-10-CM

## 2017-01-18 MED ORDER — LEVOTHYROXINE SODIUM 25 MCG PO TABS: 25.0000 ug | ORAL_TABLET | Freq: Every day | ORAL | 0 refills | Status: DC

## 2017-01-18 NOTE — Telephone Encounter (Signed)
Called patient to let her know I am going to go ahead and put her on a low dose of synthroid after discuss with Dr. Shawnie Pons. Patient to follow up with OBGYN in the next week.

## 2017-01-21 ENCOUNTER — Ambulatory Visit (HOSPITAL_COMMUNITY)
Admission: RE | Admit: 2017-01-21 | Discharge: 2017-01-21 | Disposition: A | Discharge status: Home / Self Care | Payer: 59 | Source: Ambulatory Visit | Attending: Family Medicine | Admitting: Family Medicine

## 2017-01-21 ENCOUNTER — Other Ambulatory Visit: Payer: Self-pay | Admitting: Family Medicine

## 2017-01-21 ENCOUNTER — Encounter: Payer: Self-pay | Admitting: Rheumatology

## 2017-01-21 DIAGNOSIS — O3680X0 Pregnancy with inconclusive fetal viability, not applicable or unspecified: Secondary | ICD-10-CM | POA: Diagnosis present

## 2017-01-21 DIAGNOSIS — Z3A01 Less than 8 weeks gestation of pregnancy: Secondary | ICD-10-CM | POA: Insufficient documentation

## 2017-01-21 DIAGNOSIS — O30041 Twin pregnancy, dichorionic/diamniotic, first trimester: Secondary | ICD-10-CM | POA: Insufficient documentation

## 2017-01-21 DIAGNOSIS — O209 Hemorrhage in early pregnancy, unspecified: Secondary | ICD-10-CM | POA: Diagnosis not present

## 2017-01-21 DIAGNOSIS — Z3401 Encounter for supervision of normal first pregnancy, first trimester: Secondary | ICD-10-CM

## 2017-01-21 NOTE — Telephone Encounter (Signed)
Please reschedule appointment to discuss Cimzia.

## 2017-01-22 ENCOUNTER — Other Ambulatory Visit: Payer: Self-pay | Admitting: Rheumatology

## 2017-01-24 ENCOUNTER — Telehealth: Payer: Self-pay

## 2017-01-24 ENCOUNTER — Ambulatory Visit (INDEPENDENT_AMBULATORY_CARE_PROVIDER_SITE_OTHER): Payer: 59 | Admitting: Rheumatology

## 2017-01-24 ENCOUNTER — Encounter: Payer: Self-pay | Admitting: Rheumatology

## 2017-01-24 VITALS — BP 106/67 | HR 89 | Ht 68.0 in | Wt 246.0 lb

## 2017-01-24 DIAGNOSIS — Z79899 Other long term (current) drug therapy: Secondary | ICD-10-CM

## 2017-01-24 DIAGNOSIS — M0579 Rheumatoid arthritis with rheumatoid factor of multiple sites without organ or systems involvement: Secondary | ICD-10-CM | POA: Diagnosis not present

## 2017-01-24 DIAGNOSIS — F411 Generalized anxiety disorder: Secondary | ICD-10-CM | POA: Diagnosis not present

## 2017-01-24 DIAGNOSIS — Z8659 Personal history of other mental and behavioral disorders: Secondary | ICD-10-CM

## 2017-01-24 DIAGNOSIS — Z3A08 8 weeks gestation of pregnancy: Secondary | ICD-10-CM

## 2017-01-24 DIAGNOSIS — Z87891 Personal history of nicotine dependence: Secondary | ICD-10-CM

## 2017-01-24 DIAGNOSIS — E039 Hypothyroidism, unspecified: Secondary | ICD-10-CM | POA: Diagnosis not present

## 2017-01-24 MED ORDER — PREDNISONE 5 MG PO TABS: ORAL_TABLET | ORAL | 0 refills | Status: DC

## 2017-01-24 NOTE — Telephone Encounter (Signed)
A prior authorization for [employer] was submitted to patients insurance via cover my meds. Will update once we receive a response.   Marleta Lapierre, [place], CPhT 10:04 AM

## 2017-01-24 NOTE — Progress Notes (Signed)
Office Visit Note  Patient: Lori Valentine             Date of Birth: [birth year]           MRN: [id number]             PCP: Berton Bon, MD Referring: Berton Bon, MD Visit Date: 01/24/2017 Occupation: @GUAROCC @    Subjective:  Follow-up (PCP filled meds, pt found out shes pregnant with twins, stopped xeljanz. pt having flare in hands feet and legs)   History of Present Illness: Lori Valentine is a 31 y.o. female with history of some severe erosive rheumatoid arthritis. She is about [redacted] weeks pregnant now. She came off xeljanz after being notified that she was pregnant. She is having pain and swelling in her hands especially her right wrist and right elbow. She's also having nocturnal pain in her bilateral shoulders. She has pain in her feet and has difficulty walking. She also has discomfort in her right knee joint.  Activities of Daily Living:  Patient reports morning stiffness for  hours.   Patient Reports nocturnal pain.  Difficulty dressing/grooming: Denies Difficulty climbing stairs: Reports Difficulty getting out of chair: Reports Difficulty using hands for taps, buttons, cutlery, and/or writing: Reports   Review of Systems  Constitutional: Positive for fatigue and weight gain. Negative for night sweats, weight loss and weakness.  HENT: Negative for mouth sores, trouble swallowing, trouble swallowing, mouth dryness and nose dryness.   Eyes: Negative for pain, redness, visual disturbance and dryness.  Respiratory: Negative for cough, shortness of breath and difficulty breathing.   Cardiovascular: Negative for chest pain, palpitations, hypertension, irregular heartbeat and swelling in legs/feet.  Gastrointestinal: Negative for blood in stool, constipation and diarrhea.  Endocrine: Negative for increased urination.  Genitourinary: Negative for vaginal dryness.  Musculoskeletal: Positive for arthralgias, joint pain, joint swelling and morning stiffness. Negative  for myalgias, muscle weakness, muscle tenderness and myalgias.  Skin: Negative for color change, rash, hair loss, skin tightness, ulcers and sensitivity to sunlight.  Allergic/Immunologic: Negative for susceptible to infections.  Neurological: Negative for dizziness, memory loss and night sweats.  Hematological: Negative for swollen glands.  Psychiatric/Behavioral: Positive for sleep disturbance. Negative for depressed mood. The patient is nervous/anxious.     PMFS History:  Patient Active Problem List   Diagnosis Date Noted  . Chlamydia 01/13/2017  . Positive pregnancy test 01/11/2017  . Anxiety state 12/29/2016  . Vaginal ulcer 12/29/2016  . Women's annual routine gynecological examination 12/29/2016  . History of hypothyroidism 10/22/2016  . History of anxiety and depression 10/22/2016  . Smoker 10/22/2016  . Rheumatoid arthritis (HCC) 05/13/2015  . Atypical chest pain 05/13/2015  . Hypothyroidism 07/29/2014    Past Medical History:  Diagnosis Date  . Hypothyroid   . Rheumatoid arthritis (HCC)     Family History  Problem Relation Age of Onset  . Schizophrenia Mother   . Bipolar disorder Mother   . Diabetes Other   . Heart disease Other    No past surgical history on file. Social History   Social History Narrative   34 year old son   Lives with  Sister and nephew and son.   Works as 10 at [employer]        Objective: Vital Signs: BP 104/67 (BP Location: Left Arm, Patient Position: Sitting, Cuff Size: Normal)   Pulse 84   Ht 5\' 8"  (1.727 m)   Wt 246 lb (111.6 kg)   LMP  (LMP  Unknown)   BMI 37.40 kg/m    Physical Exam  Constitutional: She is oriented to person, place, and time. She appears well-developed and well-nourished.  HENT:  Head: Normocephalic and atraumatic.  Eyes: Conjunctivae and EOM are normal.  Neck: Normal range of motion.  Cardiovascular: Normal rate, regular rhythm, normal heart sounds and intact distal pulses.     Pulmonary/Chest: Effort normal and breath sounds normal.  Abdominal: Soft. Bowel sounds are normal.  Lymphadenopathy:    She has no cervical adenopathy.  Neurological: She is alert and oriented to person, place, and time.  Skin: Skin is warm and dry. Capillary refill takes less than 2 seconds.  Psychiatric: She has a normal mood and affect. Her behavior is normal.  Nursing note and vitals reviewed.    Musculoskeletal Exam: C-spine and thoracic lumbar spine good range of motion. She had discomfort range of motion of her shoulder joints. She is tenderness over bilateral elbows. She synovitis in multiple joints as described below.  CDAI Exam: CDAI Homunculus Exam:   Tenderness:  RUE: glenohumeral, ulnohumeral and radiohumeral and wrist LUE: glenohumeral and wrist Right hand: 1st MCP, 2nd MCP and 3rd MCP Left hand: 1st MCP, 2nd MCP and 3rd MCP RLE: tibiofemoral and tibiotalar LLE: tibiotalar Right foot: 1st MTP, 2nd MTP and 3rd MTP Left foot: 1st MTP, 2nd MTP, 3rd MTP, 5th MTP and 2nd PIP  Swelling:  RUE: wrist LUE: wrist Right hand: 1st MCP, 2nd MCP and 3rd MCP Left hand: 1st MCP, 2nd MCP and 3rd MCP LLE: tibiotalar Right foot: 2nd MTP and 3rd MTP Left foot: 2nd MTP, 3rd MTP and 2nd PIP  Joint Counts:  CDAI Tender Joint count: 12 CDAI Swollen Joint count: 8  Global Assessments:  Patient Global Assessment: 7 Provider Global Assessment: 7  CDAI Calculated Score: 34    Investigation: No additional findings.   Imaging: US Ob Comp Less 14 Wks  Result Date: 01/21/2017 CLINICAL DATA:  Pregnancy of uncertain fetal viability, dating EXAM: TWIN OBSTETRIC <14WK [place] AND TRANSVAGINAL OB US COMPARISON:  None FINDINGS: Number of IUPs:  2 Chorionicity/Amnionicity:  Dichorionic-diamniotic TWIN 1 Yolk sac:  Present Embryo:  Present Cardiac Activity: Present Heart Rate: 169 bpm CRL:  18.1  mm   8 w 2 d                  [place] EDC: 08/31/2017 TWIN 2 Yolk sac:  Present Embryo:  Present  Cardiac Activity: Present Heart Rate: 176 bpm CRL:  18.4  mm   8 w 2 d                  [place] EDC: 08/31/2017 Subchorionic hemorrhage:  Small subchronic hemorrhage visualized. Maternal uterus/adnexae: RIGHT ovary normal size and morphology 1.5 x 2.8 x 1.4 cm. LEFT ovary normal size and morphology, 2.4 x 2.9 x 2.8 cm No free pelvic fluid or adnexal masses. IMPRESSION: Live twin intrauterine pregnancy as above. Small subchronic hemorrhage. Electronically Signed   By: Ulyses Southward M.D.   On: 01/21/2017 13:55   US Ob Comp Addl Gest Less 14 Wks  Result Date: 01/21/2017 CLINICAL DATA:  Pregnancy of uncertain fetal viability, dating EXAM: TWIN OBSTETRIC <14WK [place] AND TRANSVAGINAL OB US COMPARISON:  None FINDINGS: Number of IUPs:  2 Chorionicity/Amnionicity:  Dichorionic-diamniotic TWIN 1 Yolk sac:  Present Embryo:  Present Cardiac Activity: Present Heart Rate: 169 bpm CRL:  18.1  mm   8 w 2 d  [place] EDC: 08/31/2017 TWIN 2 Yolk sac:  Present Embryo:  Present Cardiac Activity: Present Heart Rate: 176 bpm CRL:  18.4  mm   8 w 2 d                  [place] EDC: 08/31/2017 Subchorionic hemorrhage:  Small subchronic hemorrhage visualized. Maternal uterus/adnexae: RIGHT ovary normal size and morphology 1.5 x 2.8 x 1.4 cm. LEFT ovary normal size and morphology, 2.4 x 2.9 x 2.8 cm No free pelvic fluid or adnexal masses. IMPRESSION: Live twin intrauterine pregnancy as above. Small subchronic hemorrhage. Electronically Signed   By: Ulyses Southward M.D.   On: 01/21/2017 13:55   US Ob Transvaginal  Result Date: 01/21/2017 CLINICAL DATA:  Pregnancy of uncertain fetal viability, dating EXAM: TWIN OBSTETRIC <14WK [place] AND TRANSVAGINAL OB US COMPARISON:  None FINDINGS: Number of IUPs:  2 Chorionicity/Amnionicity:  Dichorionic-diamniotic TWIN 1 Yolk sac:  Present Embryo:  Present Cardiac Activity: Present Heart Rate: 169 bpm CRL:  18.1  mm   8 w 2 d                  [place] EDC: 08/31/2017 TWIN 2 Yolk sac:  Present Embryo:  Present Cardiac  Activity: Present Heart Rate: 176 bpm CRL:  18.4  mm   8 w 2 d                  [place] EDC: 08/31/2017 Subchorionic hemorrhage:  Small subchronic hemorrhage visualized. Maternal uterus/adnexae: RIGHT ovary normal size and morphology 1.5 x 2.8 x 1.4 cm. LEFT ovary normal size and morphology, 2.4 x 2.9 x 2.8 cm No free pelvic fluid or adnexal masses. IMPRESSION: Live twin intrauterine pregnancy as above. Small subchronic hemorrhage. Electronically Signed   By: Ulyses Southward M.D.   On: 01/21/2017 13:55    Speciality Comments: No specialty comments available.    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 Patient is having severe flare with pain and swelling in multiple joints. She has active synovitis as described above. She's having difficulty with mobility. We had detailed discussion regarding different treatment options that she's pregnant now. We discussed the option of Plaquenil, sulfasalazine and Cimzia. She had failed Plaquenil in the past. After reviewing indications contraindications and side effects she wanted to proceed with Cimzia. A handout was given consent was taken. Plan is to start her on Cimzia subcutaneous today and then she can take some give Cimzia at home after that. We will observe her in the office for 30 minutes. As she has severe synovitis 7 also given her a prednisone taper. She will take prednisone 20 mg for 4 days, 15 mg for 4 days, 10 mg for 4 days, 5 mg for 4 days, 2.5 mg a 4 days and then discontinue. Hopefully that'll be sufficient bridging therapy until Cimzia gets into her system. Patient was given a sample Cimzia is injection in the office today and was observed for next 30 minutes in the office. She tolerated it well. We will call in prescription for Cimzia. She will start the loading dose and then the  maintenance dose.  High risk medication use - we plan her for Cimzia injection today. (Remicade IV 3mg / Kg inadequate response, Enbrel - inadq response). She will need labs in a month then every 3 months to monitor for drug toxicity.  [redacted] weeks gestation of pregnancy: Patient is pregnant with twins. She has appointment coming with her OB.  Anxiety state: She is very anxious. She's having difficulty with mobility and is unable to function all day due to severe arthritis. She's very anxious about how she will function throughout her pregnancy.  History of anxiety and depression  Hypothyroidism, unspecified type   Former smoker   Orders: No orders of the defined types were placed in this encounter.  Meds ordered this encounter  Medications  . predniSONE (DELTASONE) 5 MG tablet    Sig: Take 4 tablets x 4 days, 3 tablets x 4 days, 2 tablets x 4 days, 1 tablet x 4 days, 1/2 tablet x 4 days.    Dispense:  42 tablet    Refill:  0    Face-to-face time spent with patient was 45 minutes. Greater than 50% of time was spent in counseling and coordination of care.  Follow-Up Instructions: Return in about 3 months (around 04/26/2017) for Rheumatoid arthritis, pregnancy.   Pollyann SavoyShaili Rebekah Sprinkle, MD  Note - This record has been created using Animal [employer]istDragon software.  Chart creation errors have been sought, but may not always  have been located. Such creation errors do not reflect on  the standard of medical care.

## 2017-01-24 NOTE — Patient Instructions (Signed)
Standing Labs We placed an order today for your standing lab work.    Please come back and get your standing labs in 66month then every 3 months  We have open lab Monday through Friday from 8:30-11:30 AM and 1:30-4 PM at the office of Dr. Pollyann Savoy.   The office is located at [address], [address], [place], [place] [address] No appointment is necessary.   Labs are drawn by [facility].  You may receive a bill from [facility] for your lab work. If you have any questions regarding directions or hours of operation,  please call [phone].   Certolizumab pegol injection What is this medicine? CERTOLIZUMAB (SER toe LIZ oo mab) is used to treat Crohn's disease, psoriatic arthritis, or rheumatoid arthritis. This medicine may be used for other purposes; ask your health care provider or pharmacist if you have questions. COMMON BRAND NAME(S): Cimzia What should I tell my health care provider before I take this medicine? They need to know if you have any of these conditions: -diabetes -heart disease -hepatitis B or history of hepatitis B infection -immune system problems -infection or history of infections -low blood counts, like low white cell, platelet, or red cell counts -multiple sclerosis -recently received or scheduled to receive a vaccine -scheduled to have surgery -tuberculosis, a positive skin test for tuberculosis or have recently been in close contact with someone who has tuberculosis -an unusual or allergic reaction to certolizumab, other medicines, foods, dyes, or preservatives -pregnant or trying to get pregnant -breast-feeding How should I use this medicine? This medicine is for injection under the skin. It is usually given by a health care professional in a hospital or clinic setting. If you get this medicine at home, you will be taught how to prepare and give this medicine. Use exactly as directed. Take your medicine at regular intervals. Do not take your medicine more  often than directed. It is important that you put your used needles and syringes in a special sharps container. Do not put them in a trash can. If you do not have a sharps container, call your pharmacist or healthcare provider to get one. A special MedGuide will be given to you by the pharmacist with each prescription and refill. Be sure to read this information carefully each time. Talk to your pediatrician regarding the use of this medicine in children. Special care may be needed. Overdosage: If you think you have taken too much of this medicine contact a poison control center or emergency room at once. NOTE: This medicine is only for you. Do not share this medicine with others. What if I miss a dose? It is important not to miss your dose. Call your doctor or health care professional if you are unable to keep an appointment. If you give yourself the medicine and you miss a dose, take it as soon as you can. If it is almost time for your next dose, take only that dose. Do not take double or extra doses. What may interact with this medicine? Do not take this medicine with any of the following medications: -abatacept -adalimumab -anakinra -etanercept -infliximab -live virus vaccines -rilonacept This medicine may also interact with the following medications: -vaccines This list may not describe all possible interactions. Give your health care provider a list of all the medicines, herbs, non-prescription drugs, or dietary supplements you use. Also tell them if you smoke, drink alcohol, or use illegal drugs. Some items may interact with your medicine. What should I watch for  while using this medicine? Visit your doctor or health care professional for regular checks on your progress. Tell your doctor or healthcare professional if your symptoms do not start to get better or if they get worse. Your condition will be monitored carefully while you are receiving this medicine. You will be tested for  tuberculosis (TB) before you start this medicine. If your doctor prescribes any medicine for TB, you should start taking the TB medicine before starting this medicine. Make sure to finish the full course of TB medicine. Call your doctor or health care professional for advice if you get a fever, chills, sore throat, or other symptoms of an infection. Do not treat yourself. This medicine may decrease your body's ability to fight infection. Try to avoid being around people who are sick. Talk to your doctor about your risk of cancer. You may be more at risk for certain types of cancers if you take this medicine. What side effects may I notice from receiving this medicine? Side effects that you should report to your doctor or health care professional as soon as possible: -allergic reactions like skin rash, itching or hives, swelling of the face, lips, or tongue -breathing problems -changes in vision -chest pain or palpitations -fever or chills, sore throat -pain, tingling, numbness in the hands or feet -red, scaly patches or raised bumps on the skin -seizures -swelling of the ankles, feet, hands -swollen lymph nodes in the neck, underarm, or groin areas -unexplained weight loss -unusual bleeding or bruising -unusually weak or tired Side effects that usually do not require medical attention (report to your doctor or health care professional if they continue or are bothersome): -irritation at site where injected This list may not describe all possible side effects. Call your doctor for medical advice about side effects. You may report side effects to FDA at 1-800-FDA-1088. Where should I keep my medicine? Keep out of the reach of children. If you are using this medicine at home, you will be instructed on how to store this medicine. Throw away any unused medicine after the expiration date on the label. NOTE: This sheet is a summary. It may not cover all possible information. If you have questions  about this medicine, talk to your doctor, pharmacist, or health care provider.  2018 Elsevier/Gold Standard (2012-01-05 10:31:30)

## 2017-01-25 ENCOUNTER — Telehealth: Payer: Self-pay

## 2017-01-25 NOTE — Telephone Encounter (Signed)
Contacted pt to check on pt due to her being so anxious when I last spoke with her about her pregnancy.  I confirmed with pt if she is doing okay and if she has found an OB/GYN to start care.  Pt stated that she had called an office but does not have an OB/GYN.  I explained to the pt that we can provide her high risk OB care here at this office.  Pt stated that it would be great.  Scheduled pt [facility] appt for 02/08/17 @ 0920.  Pt asked how long should she expect her appt to be.  I explained to the pt that expect her appts to be 2-3 hours due to our office being high risk sometimes our providers may get behind because we want to provide the best care.  Pt stated "thats understandable" and did not have any other questions.

## 2017-01-27 MED ORDER — CERTOLIZUMAB PEGOL 6 X 200 MG/ML ~~LOC~~ KIT: PACK | SUBCUTANEOUS | 0 refills | Status: DC

## 2017-01-27 NOTE — Addendum Note (Signed)
Addended by: Henriette Combs on: 01/27/2017 01:40 PM   Modules accepted: Orders

## 2017-01-27 NOTE — Telephone Encounter (Addendum)
Received a fax from [facility] stating that patient has not been approved for the [employer] Kit because it's only covered when there is a documented failure or inadequate response, contraindication per FDA label, intolerance or not a candidate for one DMARD and all of the preferred products (Actemra, Enbrel, Humira and Remicade). Patient is pregnant which is considered a contraindication for all preferred products and she had an inadequate response to MTX. Called [employer] to clarify. Spoke with Nira Conn who was able to reconsider the authorization over the phone. Will send denial letter to scan center.  Cimza starter kit has been approved from 01/26/17 through 02/26/2017. The Maintenance dose has been approved from 02/26/17 through 01/26/2018. An approval letter will be faxed to the clinic.   Authorization number: [id number]  Phone: [phone] Fax: [phone]  Will send approval letter to scan center once received.   Called patient to update. Her first injection was done in clinic on 10/22. Her next dose is scheduled for 02/07/17. She would like her Rx sent to [facility]. Will you send her starter kit to [facility]? Thanks!  Albertine Lafoy, [place], CPhT 9:36 AM

## 2017-01-27 NOTE — Telephone Encounter (Signed)
Prescription has been sent to [facility] delivery and advised patient.

## 2017-01-28 MED ORDER — CERTOLIZUMAB PEGOL 2 X 200 MG/ML ~~LOC~~ KIT
400.0000 mg | PACK | Freq: Once | SUBCUTANEOUS | Status: AC
  Administered 2017-01-24: 400 mg via SUBCUTANEOUS

## 2017-01-28 NOTE — Addendum Note (Signed)
Addended by: Henriette Combs on: 01/28/2017 04:51 PM   Modules accepted: Orders

## 2017-01-28 NOTE — Progress Notes (Signed)
Patient received first dose of Cimzia while in office. Patient was instructed on the proper technique to administer medication. Patient was able to demonstrate proper technique Patient injections in bilateral thighs. Patient tolerated well. Patient monitored in office for 30 minutes for adverse reactions. No adverse reactions noted.   Administrations This Visit    Certolizumab Pegol KIT 400 mg    Admin Date 01/24/2017 Action Given Dose 400 mg Route Subcutaneous Administered By Carole Binning, LPN

## 2017-02-08 ENCOUNTER — Ambulatory Visit (INDEPENDENT_AMBULATORY_CARE_PROVIDER_SITE_OTHER): Payer: 59 | Admitting: Medical

## 2017-02-08 ENCOUNTER — Encounter: Payer: Self-pay | Admitting: Medical

## 2017-02-08 VITALS — BP 105/64 | Wt 244.2 lb

## 2017-02-08 DIAGNOSIS — Z3481 Encounter for supervision of other normal pregnancy, first trimester: Secondary | ICD-10-CM

## 2017-02-08 DIAGNOSIS — O0990 Supervision of high risk pregnancy, unspecified, unspecified trimester: Secondary | ICD-10-CM | POA: Insufficient documentation

## 2017-02-08 DIAGNOSIS — M0579 Rheumatoid arthritis with rheumatoid factor of multiple sites without organ or systems involvement: Secondary | ICD-10-CM

## 2017-02-08 DIAGNOSIS — Z113 Encounter for screening for infections with a predominantly sexual mode of transmission: Secondary | ICD-10-CM | POA: Diagnosis not present

## 2017-02-08 DIAGNOSIS — O30049 Twin pregnancy, dichorionic/diamniotic, unspecified trimester: Secondary | ICD-10-CM | POA: Insufficient documentation

## 2017-02-08 DIAGNOSIS — Z3400 Encounter for supervision of normal first pregnancy, unspecified trimester: Secondary | ICD-10-CM

## 2017-02-08 DIAGNOSIS — E039 Hypothyroidism, unspecified: Secondary | ICD-10-CM

## 2017-02-08 DIAGNOSIS — O30041 Twin pregnancy, dichorionic/diamniotic, first trimester: Secondary | ICD-10-CM

## 2017-02-08 DIAGNOSIS — F17200 Nicotine dependence, unspecified, uncomplicated: Secondary | ICD-10-CM

## 2017-02-08 HISTORY — DX: Twin pregnancy, dichorionic/diamniotic, unspecified trimester: O30.049

## 2017-02-08 NOTE — Progress Notes (Signed)
Pt stated having sharp pain when walking

## 2017-02-08 NOTE — Progress Notes (Signed)
   PRENATAL VISIT NOTE  Subjective:  Lori Valentine is a 31 y.o. G2P1001 at [redacted]w[redacted]d being seen today for her initial prenatal care visit.  She is currently monitored for the following issues for this high-risk pregnancy and has Hypothyroidism; Rheumatoid arthritis (HCC); History of anxiety and depression; Smoker; Vaginal ulcer; Supervision of normal first pregnancy, antepartum; and Twin gestation, dichorionic diamniotic on their problem list.  Patient reports no complaints.  Contractions: Not present. Vag. Bleeding: None.  Movement: Absent. Denies leaking of fluid.   The following portions of the patient's history were reviewed and updated as appropriate: allergies, current medications, past family history, past medical history, past social history, past surgical history and problem list. Problem list updated.  Objective:   Vitals:   02/08/17 0956  BP: 105/64  Weight: 244 lb 3.2 oz (110.8 kg)    Fetal Status: Fetal Heart Rate (bpm): 166   Movement: Absent     General:  Alert, oriented and cooperative. Patient is in no acute distress.  Skin: Skin is warm and dry. No rash noted.   Cardiovascular: Normal heart rate and rhythm noted  Respiratory: Normal respiratory effort, no problems with respiration noted Clear to auscultation   Abdomen: Soft, gravid, appropriate for gestational age.  Pain/Pressure: Present     Pelvic: Cervical exam deferred        Breast:  Symmetric, no masses palpable. No nipple discharge noted.  Extremities: Normal range of motion.  Edema: None  Mental Status:  Normal mood and affect. Normal behavior. Normal judgment and thought content.   Assessment and Plan:  Pregnancy: G2P1001 at [redacted]w[redacted]d  1. Supervision of normal first pregnancy, antepartum - Obstetric Panel, Including HIV - Culture, OB Urine - Cervicovaginal ancillary only - [place] MFM Fetal Nuchal Translucency; scheduled  2. Hypothyroidism, unspecified type - TSH - Continue Synthroid as previously prescribed  while we await lab results  3. Rheumatoid arthritis involving multiple sites with positive rheumatoid factor (HCC) - Continue biologic medication as prescribed by rheumatologist as this is the safest option for pregnancy  4. Smoker  5. Dichorionic diamniotic twin pregnancy in first trimester   First trimester warning symptoms and general obstetric precautions including but not limited to vaginal bleeding, contractions, leaking of fluid and fetal movement were reviewed in detail with the patient. Please refer to After Visit Summary for other counseling recommendations.  Return in about 4 weeks (around 03/08/2017) for [facility] with MD.   Vonzella Nipple, PA-C

## 2017-02-08 NOTE — Patient Instructions (Signed)
First Trimester of Pregnancy The first trimester of pregnancy is from week 1 until the end of week 13 (months 1 through 3). During this time, your baby will begin to develop inside you. At 6-8 weeks, the eyes and face are formed, and the heartbeat can be seen on ultrasound. At the end of 12 weeks, all the baby's organs are formed. Prenatal care is all the medical care you receive before the birth of your baby. Make sure you get good prenatal care and follow all of your doctor's instructions. Follow these instructions at home: Medicines  Take over-the-counter and prescription medicines only as told by your doctor. Some medicines are safe and some medicines are not safe during pregnancy.  Take a prenatal vitamin that contains at least 600 micrograms (mcg) of folic acid.  If you have trouble pooping (constipation), take medicine that will make your stool soft (stool softener) if your doctor approves. Eating and drinking  Eat regular, healthy meals.  Your doctor will tell you the amount of weight gain that is right for you.  Avoid raw meat and uncooked cheese.  If you feel sick to your stomach (nauseous) or throw up (vomit): ? Eat 4 or 5 small meals a day instead of 3 large meals. ? Try eating a few soda crackers. ? Drink liquids between meals instead of during meals.  To prevent constipation: ? Eat foods that are high in fiber, like fresh fruits and vegetables, whole grains, and beans. ? Drink enough fluids to keep your pee (urine) clear or pale yellow. Activity  Exercise only as told by your doctor. Stop exercising if you have cramps or pain in your lower belly (abdomen) or low back.  Do not exercise if it is too hot, too humid, or if you are in a place of great height (high altitude).  Try to avoid standing for long periods of time. Move your legs often if you must stand in one place for a long time.  Avoid heavy lifting.  Wear low-heeled shoes. Sit and stand up straight.  You  can have sex unless your doctor tells you not to. Relieving pain and discomfort  Wear a good support bra if your breasts are sore.  Take warm water baths (sitz baths) to soothe pain or discomfort caused by hemorrhoids. Use hemorrhoid cream if your doctor says it is okay.  Rest with your legs raised if you have leg cramps or low back pain.  If you have puffy, bulging veins (varicose veins) in your legs: ? Wear support hose or compression stockings as told by your doctor. ? Raise (elevate) your feet for 15 minutes, 3-4 times a day. ? Limit salt in your food. Prenatal care  Schedule your prenatal visits by the twelfth week of pregnancy.  Write down your questions. Take them to your prenatal visits.  Keep all your prenatal visits as told by your doctor. This is important. Safety  Wear your seat belt at all times when driving.  Make a list of emergency phone numbers. The list should include numbers for family, friends, the hospital, and police and fire departments. General instructions  Ask your doctor for a referral to a local prenatal class. Begin classes no later than at the start of month 6 of your pregnancy.  Ask for help if you need counseling or if you need help with nutrition. Your doctor can give you advice or tell you where to go for help.  Do not use hot tubs, steam rooms, or   saunas.  Do not douche or use tampons or scented sanitary pads.  Do not cross your legs for long periods of time.  Avoid all herbs and alcohol. Avoid drugs that are not approved by your doctor.  Do not use any tobacco products, including cigarettes, chewing tobacco, and electronic cigarettes. If you need help quitting, ask your doctor. You may get counseling or other support to help you quit.  Avoid cat litter boxes and soil used by cats. These carry germs that can cause birth defects in the baby and can cause a loss of your baby (miscarriage) or stillbirth.  Visit your dentist. At home, brush  your teeth with a soft toothbrush. Be gentle when you floss. Contact a doctor if:  You are dizzy.  You have mild cramps or pressure in your lower belly.  You have a nagging pain in your belly area.  You continue to feel sick to your stomach, you throw up, or you have watery poop (diarrhea).  You have a bad smelling fluid coming from your vagina.  You have pain when you pee (urinate).  You have increased puffiness (swelling) in your face, hands, legs, or ankles. Get help right away if:  You have a fever.  You are leaking fluid from your vagina.  You have spotting or bleeding from your vagina.  You have very bad belly cramping or pain.  You gain or lose weight rapidly.  You throw up blood. It may look like coffee grounds.  You are around people who have German measles, fifth disease, or chickenpox.  You have a very bad headache.  You have shortness of breath.  You have any kind of trauma, such as from a fall or a car accident. Summary  The first trimester of pregnancy is from week 1 until the end of week 13 (months 1 through 3).  To take care of yourself and your unborn baby, you will need to eat healthy meals, take medicines only if your doctor tells you to do so, and do activities that are safe for you and your baby.  Keep all follow-up visits as told by your doctor. This is important as your doctor will have to ensure that your baby is healthy and growing well. This information is not intended to replace advice given to you by your health care provider. Make sure you discuss any questions you have with your health care provider. Document Released: 09/08/2007 Document Revised: 03/30/2016 Document Reviewed: 03/30/2016 Elsevier Interactive Patient Education  2017 [employer]  

## 2017-02-09 ENCOUNTER — Telehealth: Payer: Self-pay | Admitting: Rheumatology

## 2017-02-09 LAB — OBSTETRIC PANEL, INCLUDING HIV
ANTIBODY SCREEN: NEGATIVE
BASOS: 0 %
Basophils Absolute: 0 10*3/uL (ref 0.0–0.2)
EOS (ABSOLUTE): 0.1 10*3/uL (ref 0.0–0.4)
EOS: 1 %
HEMATOCRIT: 37.5 % (ref 34.0–46.6)
HEMOGLOBIN: 13 g/dL (ref 11.1–15.9)
HIV SCREEN 4TH GENERATION: NONREACTIVE
Hepatitis B Surface Ag: NEGATIVE
Immature Grans (Abs): 0 10*3/uL (ref 0.0–0.1)
Immature Granulocytes: 0 %
LYMPHS ABS: 3.1 10*3/uL (ref 0.7–3.1)
LYMPHS: 32 %
MCH: 29.7 pg (ref 26.6–33.0)
MCHC: 34.7 g/dL (ref 31.5–35.7)
MCV: 86 fL (ref 79–97)
MONOS ABS: 0.4 10*3/uL (ref 0.1–0.9)
Monocytes: 5 %
NEUTROS ABS: 6 10*3/uL (ref 1.4–7.0)
Neutrophils: 62 %
Platelets: 272 10*3/uL (ref 150–379)
RBC: 4.38 x10E6/uL (ref 3.77–5.28)
RDW: 14.9 % (ref 12.3–15.4)
RPR Ser Ql: NONREACTIVE
Rh Factor: POSITIVE
Rubella Antibodies, IGG: 5.45 index (ref >0.99)
WBC: 9.6 10*3/uL (ref 3.4–10.8)

## 2017-02-09 LAB — CERVICOVAGINAL ANCILLARY ONLY
CHLAMYDIA, DNA PROBE: NEGATIVE
Neisseria Gonorrhea: NEGATIVE

## 2017-02-09 LAB — TSH: TSH: 12.31 u[IU]/mL — ABNORMAL HIGH (ref 0.450–4.500)

## 2017-02-09 NOTE — Telephone Encounter (Signed)
Patient states she will call the Insurance department to find out how the appointment will be billed to see if she can afford to come in here to get her injections monthly. Patient to call back with information and how to proceed.

## 2017-02-09 NOTE — Telephone Encounter (Signed)
Besides coming to the office or having a relative come in to train for  injection there are no other options. Please call [employer]im[employer] rep to check if there are any other options.

## 2017-02-09 NOTE — Telephone Encounter (Signed)
Patient is past due for next Cymzia injection. Patient unable to give injections herself ,and can not get injections done by someone thru insurance. Patient not able to pay copay for a visit here for injection, but needs someone to give them to her. Please call to advise.

## 2017-02-10 LAB — URINE CULTURE, OB REFLEX: ORGANISM ID, BACTERIA: NO GROWTH

## 2017-02-10 LAB — CULTURE, OB URINE

## 2017-02-11 ENCOUNTER — Other Ambulatory Visit: Payer: Self-pay | Admitting: Medical

## 2017-02-11 DIAGNOSIS — E039 Hypothyroidism, unspecified: Secondary | ICD-10-CM

## 2017-02-11 MED ORDER — LEVOTHYROXINE SODIUM 25 MCG PO TABS
75.0000 ug | ORAL_TABLET | Freq: Every day | ORAL | 1 refills | Status: DC
Start: 2017-02-11 — End: 2017-03-17

## 2017-02-15 ENCOUNTER — Other Ambulatory Visit: Payer: Self-pay | Admitting: Internal Medicine

## 2017-02-16 ENCOUNTER — Encounter (HOSPITAL_COMMUNITY): Payer: Self-pay | Admitting: Internal Medicine

## 2017-02-17 ENCOUNTER — Encounter: Payer: Self-pay | Admitting: General Practice

## 2017-02-23 ENCOUNTER — Other Ambulatory Visit: Payer: Self-pay | Admitting: Medical

## 2017-02-23 ENCOUNTER — Encounter (HOSPITAL_COMMUNITY): Payer: Self-pay

## 2017-02-23 ENCOUNTER — Ambulatory Visit (HOSPITAL_COMMUNITY)
Admission: RE | Admit: 2017-02-23 | Discharge: 2017-02-23 | Disposition: A | Discharge status: Home / Self Care | Payer: 59 | Source: Ambulatory Visit | Attending: Obstetrics & Gynecology | Admitting: Obstetrics & Gynecology

## 2017-02-23 DIAGNOSIS — O30041 Twin pregnancy, dichorionic/diamniotic, first trimester: Secondary | ICD-10-CM

## 2017-02-23 DIAGNOSIS — Z3A13 13 weeks gestation of pregnancy: Secondary | ICD-10-CM

## 2017-02-23 DIAGNOSIS — Z3682 Encounter for antenatal screening for nuchal translucency: Secondary | ICD-10-CM

## 2017-02-23 DIAGNOSIS — O99331 Smoking (tobacco) complicating pregnancy, first trimester: Secondary | ICD-10-CM

## 2017-02-23 DIAGNOSIS — Z3400 Encounter for supervision of normal first pregnancy, unspecified trimester: Secondary | ICD-10-CM

## 2017-02-23 DIAGNOSIS — O99211 Obesity complicating pregnancy, first trimester: Secondary | ICD-10-CM

## 2017-03-02 ENCOUNTER — Other Ambulatory Visit: Payer: Self-pay

## 2017-03-03 ENCOUNTER — Ambulatory Visit (INDEPENDENT_AMBULATORY_CARE_PROVIDER_SITE_OTHER): Payer: 59 | Admitting: Rheumatology

## 2017-03-03 DIAGNOSIS — Z5329 Procedure and treatment not carried out because of patient's decision for other reasons: Secondary | ICD-10-CM

## 2017-03-09 NOTE — Progress Notes (Signed)
No show

## 2017-03-11 ENCOUNTER — Ambulatory Visit (INDEPENDENT_AMBULATORY_CARE_PROVIDER_SITE_OTHER): Payer: 59 | Admitting: Obstetrics & Gynecology

## 2017-03-11 ENCOUNTER — Encounter: Payer: Self-pay | Admitting: Obstetrics & Gynecology

## 2017-03-11 VITALS — BP 105/60 | HR 97 | Wt 260.8 lb

## 2017-03-11 DIAGNOSIS — L299 Pruritus, unspecified: Secondary | ICD-10-CM

## 2017-03-11 DIAGNOSIS — O99212 Obesity complicating pregnancy, second trimester: Secondary | ICD-10-CM

## 2017-03-11 DIAGNOSIS — O99891 Other specified diseases and conditions complicating pregnancy: Secondary | ICD-10-CM | POA: Insufficient documentation

## 2017-03-11 DIAGNOSIS — O30009 Twin pregnancy, unspecified number of placenta and unspecified number of amniotic sacs, unspecified trimester: Secondary | ICD-10-CM

## 2017-03-11 DIAGNOSIS — O9989 Other specified diseases and conditions complicating pregnancy, childbirth and the puerperium: Secondary | ICD-10-CM

## 2017-03-11 DIAGNOSIS — O0992 Supervision of high risk pregnancy, unspecified, second trimester: Secondary | ICD-10-CM

## 2017-03-11 DIAGNOSIS — O30049 Twin pregnancy, dichorionic/diamniotic, unspecified trimester: Secondary | ICD-10-CM

## 2017-03-11 DIAGNOSIS — Z3689 Encounter for other specified antenatal screening: Secondary | ICD-10-CM

## 2017-03-11 DIAGNOSIS — O99282 Endocrine, nutritional and metabolic diseases complicating pregnancy, second trimester: Secondary | ICD-10-CM

## 2017-03-11 DIAGNOSIS — O99210 Obesity complicating pregnancy, unspecified trimester: Secondary | ICD-10-CM | POA: Insufficient documentation

## 2017-03-11 DIAGNOSIS — M069 Rheumatoid arthritis, unspecified: Secondary | ICD-10-CM

## 2017-03-11 DIAGNOSIS — O99712 Diseases of the skin and subcutaneous tissue complicating pregnancy, second trimester: Secondary | ICD-10-CM

## 2017-03-11 DIAGNOSIS — E039 Hypothyroidism, unspecified: Secondary | ICD-10-CM

## 2017-03-11 DIAGNOSIS — Z8659 Personal history of other mental and behavioral disorders: Secondary | ICD-10-CM

## 2017-03-11 DIAGNOSIS — O30042 Twin pregnancy, dichorionic/diamniotic, second trimester: Secondary | ICD-10-CM

## 2017-03-11 MED ORDER — HYDROXYZINE HCL 25 MG PO TABS: 25.0000 mg | ORAL_TABLET | Freq: Four times a day (QID) | ORAL | 2 refills | Status: DC | PRN

## 2017-03-11 MED ORDER — PREDNISONE 5 MG PO TABS: ORAL_TABLET | ORAL | 2 refills | Status: DC

## 2017-03-11 NOTE — Patient Instructions (Addendum)
Second Trimester of Pregnancy The second trimester is from week 14 through week 27 (months 4 through 6). The second trimester is often a time when you feel your best. Your body has adjusted to being pregnant, and you begin to feel better physically. Usually, morning sickness has lessened or quit completely, you may have more energy, and you may have an increase in appetite. The second trimester is also a time when the fetus is growing rapidly. At the end of the sixth month, the fetus is about 9 inches long and weighs about 1 pounds. You will likely begin to feel the baby move (quickening) between 16 and 20 weeks of pregnancy. Body changes during your second trimester Your body continues to go through many changes during your second trimester. The changes vary from woman to woman.  Your weight will continue to increase. You will notice your lower abdomen bulging out.  You may begin to get stretch marks on your hips, abdomen, and breasts.  You may develop headaches that can be relieved by medicines. The medicines should be approved by your health care provider.  You may urinate more often because the fetus is pressing on your bladder.  You may develop or continue to have heartburn as a result of your pregnancy.  You may develop constipation because certain hormones are causing the muscles that push waste through your intestines to slow down.  You may develop hemorrhoids or swollen, bulging veins (varicose veins).  You may have back pain. This is caused by: ? Weight gain. ? Pregnancy hormones that are relaxing the joints in your pelvis. ? A shift in weight and the muscles that support your balance.  Your breasts will continue to grow and they will continue to become tender.  Your gums may bleed and may be sensitive to brushing and flossing.  Dark spots or blotches (chloasma, mask of pregnancy) may develop on your face. This will likely fade after the baby is born.  A dark line from your  belly button to the pubic area (linea nigra) may appear. This will likely fade after the baby is born.  You may have changes in your hair. These can include thickening of your hair, rapid growth, and changes in texture. Some women also have hair loss during or after pregnancy, or hair that feels dry or thin. Your hair will most likely return to normal after your baby is born.  What to expect at prenatal visits During a routine prenatal visit:  You will be weighed to make sure you and the fetus are growing normally.  Your blood pressure will be taken.  Your abdomen will be measured to track your baby's growth.  The fetal heartbeat will be listened to.  Any test results from the previous visit will be discussed.  Your health care provider may ask you:  How you are feeling.  If you are feeling the baby move.  If you have had any abnormal symptoms, such as leaking fluid, bleeding, severe headaches, or abdominal cramping.  If you are using any tobacco products, including cigarettes, chewing tobacco, and electronic cigarettes.  If you have any questions.  Other tests that may be performed during your second trimester include:  Blood tests that check for: ? Low iron levels (anemia). ? High blood sugar that affects pregnant women (gestational diabetes) between 24 and 28 weeks. ? Rh antibodies. This is to check for a protein on red blood cells (Rh factor).  Urine tests to check for infections, diabetes, or   protein in the urine.  An ultrasound to confirm the proper growth and development of the baby.  An amniocentesis to check for possible genetic problems.  Fetal screens for spina bifida and Down syndrome.  HIV (human immunodeficiency virus) testing. Routine prenatal testing includes screening for HIV, unless you choose not to have this test.  Follow these instructions at home: Medicines  Follow your health care provider's instructions regarding medicine use. Specific  medicines may be either safe or unsafe to take during pregnancy.  Take a prenatal vitamin that contains at least 600 micrograms (mcg) of folic acid.  If you develop constipation, try taking a stool softener if your health care provider approves. Eating and drinking  Eat a balanced diet that includes fresh fruits and vegetables, whole grains, good sources of protein such as meat, eggs, or tofu, and low-fat dairy. Your health care provider will help you determine the amount of weight gain that is right for you.  Avoid raw meat and uncooked cheese. These carry germs that can cause birth defects in the baby.  If you have low calcium intake from food, talk to your health care provider about whether you should take a daily calcium supplement.  Limit foods that are high in fat and processed sugars, such as fried and sweet foods.  To prevent constipation: ? Drink enough fluid to keep your urine clear or pale yellow. ? Eat foods that are high in fiber, such as fresh fruits and vegetables, whole grains, and beans. Activity  Exercise only as directed by your health care provider. Most women can continue their usual exercise routine during pregnancy. Try to exercise for 30 minutes at least 5 days a week. Stop exercising if you experience uterine contractions.  Avoid heavy lifting, wear low heel shoes, and practice good posture.  A sexual relationship may be continued unless your health care provider directs you otherwise. Relieving pain and discomfort  Wear a good support bra to prevent discomfort from breast tenderness.  Take warm sitz baths to soothe any pain or discomfort caused by hemorrhoids. Use hemorrhoid cream if your health care provider approves.  Rest with your legs elevated if you have leg cramps or low back pain.  If you develop varicose veins, wear support hose. Elevate your feet for 15 minutes, 3-4 times a day. Limit salt in your diet. Prenatal Care  Write down your questions.  Take them to your prenatal visits.  Keep all your prenatal visits as told by your health care provider. This is important. Safety  Wear your seat belt at all times when driving.  Make a list of emergency phone numbers, including numbers for family, friends, the hospital, and police and fire departments. General instructions  Ask your health care provider for a referral to a local prenatal education class. Begin classes no later than the beginning of month 6 of your pregnancy.  Ask for help if you have counseling or nutritional needs during pregnancy. Your health care provider can offer advice or refer you to specialists for help with various needs.  Do not use hot tubs, steam rooms, or saunas.  Do not douche or use tampons or scented sanitary pads.  Do not cross your legs for long periods of time.  Avoid cat litter boxes and soil used by cats. These carry germs that can cause birth defects in the baby and possibly loss of the fetus by miscarriage or stillbirth.  Avoid all smoking, herbs, alcohol, and unprescribed drugs. Chemicals in these products can   affect the formation and growth of the baby.  Do not use any products that contain nicotine or tobacco, such as cigarettes and e-cigarettes. If you need help quitting, ask your health care provider.  Visit your dentist if you have not gone yet during your pregnancy. Use a soft toothbrush to brush your teeth and be gentle when you floss. Contact a health care provider if:  You have dizziness.  You have mild pelvic cramps, pelvic pressure, or nagging pain in the abdominal area.  You have persistent nausea, vomiting, or diarrhea.  You have a bad smelling vaginal discharge.  You have pain when you urinate. Get help right away if:  You have a fever.  You are leaking fluid from your vagina.  You have spotting or bleeding from your vagina.  You have severe abdominal cramping or pain.  You have rapid weight gain or weight  loss.  You have shortness of breath with chest pain.  You notice sudden or extreme swelling of your face, hands, ankles, feet, or legs.  You have not felt your baby move in over an hour.  You have severe headaches that do not go away when you take medicine.  You have vision changes. Summary  The second trimester is from week 14 through week 27 (months 4 through 6). It is also a time when the fetus is growing rapidly.  Your body goes through many changes during pregnancy. The changes vary from woman to woman.  Avoid all smoking, herbs, alcohol, and unprescribed drugs. These chemicals affect the formation and growth your baby.  Do not use any tobacco products, such as cigarettes, chewing tobacco, and e-cigarettes. If you need help quitting, ask your health care provider.  Contact your health care provider if you have any questions. Keep all prenatal visits as told by your health care provider. This is important. This information is not intended to replace advice given to you by your health care provider. Make sure you discuss any questions you have with your health care provider. Document Released: 03/16/2001 Document Revised: 08/28/2015 Document Reviewed: 05/23/2012 Elsevier Interactive Patient Education  2017 [employer]    Cholestasis of Pregnancy Cholestasis refers to any condition that causes the flow of digestive fluid (bile) produced by the liver to slow down or stop. Cholestasis of pregnancy is most common toward the end of pregnancy (thirdtrimester), but it can occur any time during pregnancy. The condition often goes away soon after giving birth. Cholestasis may be uncomfortable, but it is usually harmless to you. However, it can be harmful to your baby. Cholestasis may increase the risk of:  Your baby being born too early (preterm delivery).  Your baby having a slow heart rate and lack of oxygen during delivery (fetal distress).  Losing your baby before delivery  (stillbirth).  What are the causes? The exact cause of this condition is not known, but it may be related to:  Pregnancy hormones. The gallbladder normally holds bile until you need it to help digest fat in your diet. Pregnancy hormones may cause the flow of bile to slow down and back up into your liver. Bile may then get into your bloodstream and cause cholestasis symptoms.  Changes in your genes (genetic mutations). Specifically, genes that affect how the liver releases bile.  What increases the risk? You are more likely to develop this condition if:  You had cholestasis during a previous pregnancy.  You have a family history of cholestasis.  You have liver problems.  You  are having multiple babies, such as twins or triplets.  What are the signs or symptoms? The most common symptom of this condition is intense itching (pruritus), especially on the palms of your hands and soles of your feet. The itching can spread to the rest of your body and is often worse at night. You will not usually have a rash. Other symptoms may include:  Feeling tired.  Pain in your upper right abdomen.  Dark-colored urine.  Light-colored stools.  Poor appetite.  Yellowish discoloration of your skin and the whites of your eyes (jaundice).  How is this diagnosed? This condition is diagnosed based on:  Your medical history.  A physical exam.  Blood tests.  If you have an inherited risk for developing this condition, you may also have genetic testing. How is this treated? The goal of treatment is to make you comfortable and keep your baby safe. Your health care provider may:  Prescribe medicine to reduce bile acid in your bloodstream, relieve symptoms, and help keep your baby safe.  Give you vitamin K before delivery to prevent excessive bleeding.  Check your baby frequently (fetal monitoring).  Perform regular blood tests to check your bile levels and liver function until your baby is  delivered.  Recommend starting (inducing) your labor and delivery by week 36 or 37 of pregnancy, or as soon as your baby's lungs have developed enough.  Follow these instructions at home:  Take over-the-counter and prescription medicines only as told by your health care provider.  Take cool baths to soothe itchy skin.  Wear comfortable, loose-fitting, cotton clothing to reduce itching.  Keep your fingernails short to prevent skin irritation from scratching.  Keep all follow-up visits and prenatal visits as told by your health care provider. This is important. Contact a health care provider if:  Your symptoms get worse, even with treatment.  You develop pain in your right side.  You have unusual swelling in your abdomen, feet, ankles, or legs.  You have a fever.  You are more thirsty than usual. Get help right away if:  You go into early labor.  You have a headache that does not go away or causes changes in vision.  You have nausea or you vomit.  You have severe pain in your abdomen or shoulders.  You have shortness of breath. Summary  Cholestasis of pregnancy is most common toward the end of pregnancy (thirdtrimester), but it can occur any time during your pregnancy.  The condition often goes away soon after your baby is born.  The most common symptom of cholestasis of pregnancy is intense itching (pruritus), especially on the palms of your hands and soles of your feet.  This condition may be treated with medicine, frequent monitoring, or starting (inducing) labor and delivery by week 36 or 37 of pregnancy. This information is not intended to replace advice given to you by your health care provider. Make sure you discuss any questions you have with your health care provider. Document Released: 03/19/2000 Document Revised: 03/06/2016 Document Reviewed: 03/06/2016 Elsevier Interactive Patient Education  2017 [employer].

## 2017-03-11 NOTE — Progress Notes (Signed)
C/o some swelling in her legs- not sure if is RA flare or pregnancy.

## 2017-03-11 NOTE — Progress Notes (Signed)
PRENATAL VISIT NOTE  Subjective:  Lori Valentine is a 31 y.o. G2P1001 at [redacted]w[redacted]d being seen today for ongoing prenatal care.  She is currently monitored for the following issues for this high-risk pregnancy and has Hypothyroidism affecting pregnancy; Rheumatoid arthritis (HCC); History of anxiety and depression; Smoker; Vaginal ulcer; Supervision of high-risk pregnancy; Twin gestation, dichorionic diamniotic; Obesity in pregnancy, antepartum; and Maternal rheumatoid arthritis complicating pregnancy (HCC) on their problem list.  Patient reports R wrist swelling worried about RA flare. Usually alleviated by Prednisone taper.  Will be seen by Rheumatologist next month Also reports diffuse itching all over body for the past week, no rash.  Contractions: Not present. Vag. Bleeding: None.  Movement: Present. Denies leaking of fluid.   The following portions of the patient's history were reviewed and updated as appropriate: allergies, current medications, past family history, past medical history, past social history, past surgical history and problem list. Problem list updated.  Objective:   Vitals:   03/11/17 0930  BP: 105/60  Pulse: 97  Weight: 260 lb 12.8 oz (118.3 kg)    Fetal Status: Fetal Heart Rate (bpm): 155/150   Movement: Present     General:  Alert, oriented and cooperative. Patient is in no acute distress.  Skin: Skin is warm and dry. No rash noted.   Cardiovascular: Normal heart rate noted  Respiratory: Normal respiratory effort, no problems with respiration noted  Abdomen: Soft, gravid, appropriate for gestational age.  Pain/Pressure: Present     Pelvic: Cervical exam deferred        Extremities: Normal range of motion.  Edema: Trace  Mental Status:  Normal mood and affect. Normal behavior. Normal judgment and thought content.   Assessment and Plan:  Pregnancy: G2P1001 at [redacted]w[redacted]d  1. Dichorionic diamniotic twin pregnancy, antepartum Anatomy scan - [place] MFM OB DETAIL +14 WK;  Future - [place] [facility] OB DETAIL ADDL GEST +14 WK; Future  2. Maternal rheumatoid arthritis complicating pregnancy (HCC) Prednisone taper prescribed, follow up with specialist. Continue maintencance medication regimen. - [place] [facility] OB DETAIL +14 WK; Future - [place] [facility] OB DETAIL ADDL GEST +14 WK; Future - predniSONE (DELTASONE) 5 MG tablet; Take 4 tablets x 4 days, 3 tablets x 4 days, 2 tablets x 4 days, 1 tablet x 4 days, 1/2 tablet x 4 days.  Dispense: 42 tablet; Refill: 2  3. Hypothyroidism affecting pregnancy in second trimester Synthroid increased last month, will follow up labs today. - TSH - T4, free - T3, free  4. Pruritus of pregnancy in second trimester ICP labs done; Prednisone and Atarax prescribed. Will follow up results and manage accordingly. - predniSONE (DELTASONE) 5 MG tablet; Take 4 tablets x 4 days, 3 tablets x 4 days, 2 tablets x 4 days, 1 tablet x 4 days, 1/2 tablet x 4 days.  Dispense: 42 tablet; Refill: 2 - Comprehensive metabolic panel - Bile acids, total - AFP, Serum, Open Spina Bifida - hydrOXYzine (ATARAX/VISTARIL) 25 MG tablet; Take 1 tablet (25 mg total) by mouth every 6 (six) hours as needed for itching.  Dispense: 30 tablet; Refill: 2  5. History of anxiety and depression Continue monitoring symptoms, doing well today.  6. Obesity in pregnancy, antepartum Total weight gain 21 lb so far, continue to monitor.   7. Encounter for ultrasound to assess fetal anatomy and growth in twin pregnancy, antepartum - [place] [facility] OB DETAIL +14 WK; Future - [place] [facility] OB DETAIL ADDL GEST +14 WK; Future  8. Supervision of high risk pregnancy  in second trimester No other complaints or concerns.  Routine obstetric precautions reviewed. Please refer to After Visit Summary for other counseling recommendations.  Return in about 4 weeks (around 04/08/2017) for OB Visit (HOB).   Jaynie Collins, MD

## 2017-03-17 ENCOUNTER — Other Ambulatory Visit: Payer: Self-pay | Admitting: Family Medicine

## 2017-03-17 DIAGNOSIS — O9989 Other specified diseases and conditions complicating pregnancy, childbirth and the puerperium: Secondary | ICD-10-CM

## 2017-03-17 DIAGNOSIS — M069 Rheumatoid arthritis, unspecified: Secondary | ICD-10-CM

## 2017-03-17 DIAGNOSIS — O99282 Endocrine, nutritional and metabolic diseases complicating pregnancy, second trimester: Principal | ICD-10-CM

## 2017-03-17 DIAGNOSIS — E039 Hypothyroidism, unspecified: Secondary | ICD-10-CM

## 2017-03-17 DIAGNOSIS — O99891 Other specified diseases and conditions complicating pregnancy: Secondary | ICD-10-CM

## 2017-03-17 LAB — AFP, SERUM, OPEN SPINA BIFIDA
AFP MoM: 2.81
AFP VALUE AFPOSL: 56.8 ng/mL
GEST. AGE ON COLLECTION DATE: 15.3 wk
MATERNAL AGE AT EDD: 31.1 a
OSBR Risk 1 IN: 468
Test Results:: NEGATIVE
WEIGHT: 260 [lb_av]

## 2017-03-17 LAB — COMPREHENSIVE METABOLIC PANEL
A/G RATIO: 1.5 (ref 1.2–2.2)
ALK PHOS: 53 IU/L (ref 39–117)
ALT: 17 IU/L (ref 0–32)
AST: 15 IU/L (ref 0–40)
Albumin: 3.5 g/dL (ref 3.5–5.5)
BUN/Creatinine Ratio: 17 (ref 9–23)
BUN: 6 mg/dL (ref 6–20)
CHLORIDE: 105 mmol/L (ref 96–106)
CO2: 21 mmol/L (ref 20–29)
Calcium: 8.4 mg/dL — ABNORMAL LOW (ref 8.7–10.2)
Creatinine, Ser: 0.36 mg/dL — ABNORMAL LOW (ref 0.57–1.00)
GFR calc Af Amer: 167 mL/min/{1.73_m2} (ref >59)
GFR calc non Af Amer: 145 mL/min/{1.73_m2} (ref >59)
GLOBULIN, TOTAL: 2.4 g/dL (ref 1.5–4.5)
Glucose: 79 mg/dL (ref 65–99)
POTASSIUM: 3.9 mmol/L (ref 3.5–5.2)
SODIUM: 137 mmol/L (ref 134–144)
Total Protein: 5.9 g/dL — ABNORMAL LOW (ref 6.0–8.5)

## 2017-03-17 LAB — T4, FREE: FREE T4: 0.8 ng/dL — AB (ref 0.82–1.77)

## 2017-03-17 LAB — T3, FREE: T3 FREE: 3.4 pg/mL (ref 2.0–4.4)

## 2017-03-17 LAB — BILE ACIDS, TOTAL: BILE ACIDS TOTAL: 8.7 umol/L (ref 4.7–24.5)

## 2017-03-17 LAB — TSH: TSH: 5.76 u[IU]/mL — AB (ref 0.450–4.500)

## 2017-03-17 MED ORDER — LEVOTHYROXINE SODIUM 100 MCG PO TABS: 100.0000 ug | ORAL_TABLET | Freq: Every day | ORAL | 2 refills | Status: DC

## 2017-03-24 NOTE — Progress Notes (Deleted)
Office Visit Note  Patient: Lori Valentine             Date of Birth: 1985-11-13           MRN: [id number]             PCP: Berton Bon, MD Referring: Berton Bon, MD Visit Date: 04/06/2017 Occupation: @GUAROCC @    Subjective:  No chief complaint on file.   History of Present Illness: Lori Valentine is a 31 y.o. female ***   Activities of Daily Living:  Patient reports morning stiffness for *** {minute/hour:19697}.   Patient {ACTIONS;DENIES/REPORTS:21021675::"Denies"} nocturnal pain.  Difficulty dressing/grooming: {ACTIONS;DENIES/REPORTS:21021675::"Denies"} Difficulty climbing stairs: {ACTIONS;DENIES/REPORTS:21021675::"Denies"} Difficulty getting out of chair: {ACTIONS;DENIES/REPORTS:21021675::"Denies"} Difficulty using hands for taps, buttons, cutlery, and/or writing: {ACTIONS;DENIES/REPORTS:21021675::"Denies"}   No Rheumatology ROS completed.   PMFS History:  Patient Active Problem List   Diagnosis Date Noted  . Obesity in pregnancy, antepartum 03/11/2017  . Maternal rheumatoid arthritis complicating pregnancy (HCC) 03/11/2017  . Supervision of high-risk pregnancy 02/08/2017  . 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 Vaginal ulcer 12/29/2016  . History of anxiety and depression 10/22/2016  . Smoker 10/22/2016  . Rheumatoid arthritis (HCC) 05/13/2015  . Hypothyroidism affecting pregnancy 07/29/2014    Past Medical History:  Diagnosis Date  . Hypothyroid   . Rheumatoid arthritis (HCC)     Family History  Problem Relation Age of Onset  . Schizophrenia Mother   . Bipolar disorder Mother   . Diabetes Other   . Heart disease Other    No past surgical history on file. Social History   Social History Narrative   76 year old son   Lives with  Sister and nephew and son.   Works as Sales executive at [employer]     Objective: Vital Signs: LMP 11/17/2016 (Approximate)    Physical Exam   Musculoskeletal Exam: ***  CDAI  Exam: No CDAI exam completed.    Investigation: No additional findings.TB Gold: 07/07/2016 Negative  CBC Latest Ref Rng & Units 02/08/2017 10/27/2016 09/01/2016  WBC 3.4 - 10.8 x10E3/uL 9.6 5.1 3.2(L)  Hemoglobin 11.1 - 15.9 g/dL 86.5 78.4 69.6  Hematocrit 34.0 - 46.6 % 37.5 38.0 36.9  Platelets 150 - 379 x10E3/uL 272 296 231   CMP Latest Ref Rng & Units 03/11/2017 12/02/2016 10/27/2016  Glucose 65 - 99 mg/dL 79 295(M) 92  BUN 6 - 20 mg/dL 6 18 14   Creatinine 0.57 - 1.00 mg/dL 8.41(L) 2.44 0.10  Sodium 134 - 144 mmol/L 137 136 138  Potassium 3.5 - 5.2 mmol/L 3.9 4.4 3.6  Chloride 96 - 106 mmol/L 105 105 109  CO2 20 - 29 mmol/L 21 20 22   Calcium 8.7 - 10.2 mg/dL 2.7(O) 8.9 5.3(G)  Total Protein 6.0 - 8.5 g/dL 5.9(L) 7.2 6.8  Total Bilirubin 0.0 - 1.2 mg/dL <6.4 0.3 0.5  Alkaline Phos 39 - 117 IU/L 53 90 77  AST 0 - 40 IU/L 15 19 84(H)  ALT 0 - 32 IU/L 17 22 75(H)    Imaging: [place] Mfm Fetal Nuchal Translucency  Result Date: 02/24/2017 ----------------------------------------------------------------------  OBSTETRICS REPORT                       (Signed Final 02/24/2017 08:01 am) ---------------------------------------------------------------------- Patient Info  ID #:       [id number]                          D.O.B.:  Aug 21, 1985 (31 yrs)  Name:       TRENNA Hoogendoorn                    Visit Date: 02/23/2017 01:13 pm ---------------------------------------------------------------------- Performed By  Performed By:     Tomma Lightning             Ref. Address:      [address] [place],                                                              [place] [address]  Attending:        Particia Nearing MD       Location:          [facility]  Referred By:      Marny Lowenstein                    [place] ---------------------------------------------------------------------- Orders   #  Description                                  Code   1  [place] MFM FETAL NUCHAL                         [phone]      TRANSLUCENCY   2  [place] MFM FETAL NUCHAL TRANS ADDL              [id number]      GEST  ----------------------------------------------------------------------   #  Ordered By               Order #        Accession #    Episode #   1  Vonzella Nipple             [id number]      [id number]     [id number]   2  JULIE WENZEL             [id number]      [id number]     [id number]  ---------------------------------------------------------------------- Indications   [redacted] weeks gestation of pregnancy                Z3A.13   Encounter for nuchal translucency              Z36.82   Obesity complicating pregnancy, first          O99.211   trimester   Smoking complicating pregnancy, first          O99.331   trimester   Twin pregnancy, di/di, first trimester         O30.041  ---------------------------------------------------------------------- OB History  Blood Type:  Height:  5'8"   Weight (lb):  255       BMI:  38.77  Gravidity:    2         Term:   1  Living:       1 ---------------------------------------------------------------------- Fetal Evaluation (Fetus A)  Num Of Fetuses:     2  Fetal Heart         151  Rate(bpm):  Cardiac Activity:   Observed  Fetal Lie:          Maternal right side  Presentation:       Variable  Placenta:           Posterior  Amniotic Fluid  AFI FV:      Subjectively within normal limits ---------------------------------------------------------------------- Gestational Age (Fetus A)  LMP:           14w 0d        Date:  11/17/16                 EDD:    08/24/17  Best:          Dot Been13w 0d     Det. ByMarcella Dubs:  Early Ultrasound         EDD:    08/31/17                                      (01/21/17) ---------------------------------------------------------------------- 1st Trimester Genetic Sonogram Screening (Fetus A)  CRL:            66.6  mm    G. Age:   12w 6d                 EDD:    09/01/17  Nuc Trans:       1.3  mm  Nasal Bone:                  Present ---------------------------------------------------------------------- Anatomy (Fetus A)  Cranium:               Appears normal         Upper Extremities:      Visualized  Choroid Plexus:        Appears normal         Lower Extremities:      Visualized  Bladder:               Appears normal ---------------------------------------------------------------------- Fetal Evaluation (Fetus B)  Num Of Fetuses:     2  Fetal Heart         157  Rate(bpm):  Cardiac Activity:   Observed  Fetal Lie:          Maternal left side  Presentation:       Variable  Placenta:           Anterior  Amniotic Fluid  AFI FV:      Subjectively within normal limits ---------------------------------------------------------------------- Gestational Age (Fetus B)  LMP:           14w 0d        Date:  11/17/16                 EDD:    08/24/17  Best:          Dot Been13w 0d     Det. ByMarcella Dubs:  Early Ultrasound         EDD:    08/31/17                                      (  01/21/17) ---------------------------------------------------------------------- 1st Trimester Genetic Sonogram Screening (Fetus B)  CRL:            69.5  mm    G. Age:   13w 0d                 EDD:    08/31/17  Nuc Trans:       1.1  mm  Nasal Bone:                 Present ---------------------------------------------------------------------- Anatomy (Fetus B)  Cranium:               Appears normal         Bladder:                Appears normal  Choroid Plexus:        Appears normal         Upper Extremities:      Visualized  Stomach:               Appears normal, left   Lower Extremities:      Visualized                         sided ---------------------------------------------------------------------- Cervix Uterus Adnexa  Cervix  Normal appearance by transabdominal scan.  Uterus  No abnormality visualized.  Left Ovary  Within normal limits.  Right Ovary  Within normal limits. ---------------------------------------------------------------------- Impression  Dichorionic/diamniotic twin  pregnancy at 13+0 weeks  No gross abnormalities identified x 2  NT measurement was within normal limits for this GA x 2; NB  present x 2  Normal amniotic fluid volume x 2  Measurements consistent with prior US x 2 ---------------------------------------------------------------------- Recommendations  Offer MSAFP in the second trimester for ONTD screening  Offer detailed U/S by 18 weeks ----------------------------------------------------------------------                 Particia Nearing, MD Electronically Signed Final Report   02/24/2017 08:01 am ----------------------------------------------------------------------  [place] Mfm Fetal Nuchal Trans Addl Gest  Result Date: 02/24/2017 ----------------------------------------------------------------------  OBSTETRICS REPORT                       (Signed Final 02/24/2017 08:01 am) ---------------------------------------------------------------------- Patient Info  ID #:       [id number]                          D.O.B.:  02/02/1986 (31 yrs)  Name:       Dois Davenport Brownfield                    Visit Date: 02/23/2017 01:13 pm ---------------------------------------------------------------------- Performed By  Performed By:     Benjamine Mola Vics             Ref. Address:      [address] [place],  [place] [address]  Attending:        Particia Nearing MD       Location:          [facility]  Referred By:      Marny Lowenstein                    [place] ---------------------------------------------------------------------- Orders   #  Description                                 Code   1  [place] MFM FETAL NUCHAL                         [phone]      TRANSLUCENCY   2  [place] MFM FETAL NUCHAL TRANS ADDL              06301.6      GEST  ----------------------------------------------------------------------   #  Ordered By               Order #        Accession #     Episode #   1  Vonzella Nipple             [id number]      [id number]     [id number]   2  JULIE WENZEL             [id number]      [id number]     [id number]  ---------------------------------------------------------------------- Indications   [redacted] weeks gestation of pregnancy                Z3A.13   Encounter for nuchal translucency              Z36.82   Obesity complicating pregnancy, first          O99.211   trimester   Smoking complicating pregnancy, first          O99.331   trimester   Twin pregnancy, di/di, first trimester         O30.041  ---------------------------------------------------------------------- OB History  Blood Type:            Height:  5'8"   Weight (lb):  255       BMI:  38.77  Gravidity:    2         Term:   1  Living:       1 ---------------------------------------------------------------------- Fetal Evaluation (Fetus A)  Num Of Fetuses:     2  Fetal Heart         151  Rate(bpm):  Cardiac Activity:   Observed  Fetal Lie:          Maternal right side  Presentation:       Variable  Placenta:           Posterior  Amniotic Fluid  AFI FV:      Subjectively within normal limits ---------------------------------------------------------------------- Gestational Age (Fetus A)  LMP:           14w 0d        Date:  11/17/16                 EDD:    08/24/17  Best:          Dot Been 0d     Det. By:  Marcella Dubs  EDD:    08/31/17                                      (01/21/17) ---------------------------------------------------------------------- 1st Trimester Genetic Sonogram Screening (Fetus A)  CRL:            66.6  mm    G. Age:   12w 6d                 EDD:    09/01/17  Nuc Trans:       1.3  mm  Nasal Bone:                 Present ---------------------------------------------------------------------- Anatomy (Fetus A)  Cranium:               Appears normal         Upper Extremities:      Visualized  Choroid Plexus:        Appears normal         Lower Extremities:      Visualized  Bladder:                Appears normal ---------------------------------------------------------------------- Fetal Evaluation (Fetus B)  Num Of Fetuses:     2  Fetal Heart         157  Rate(bpm):  Cardiac Activity:   Observed  Fetal Lie:          Maternal left side  Presentation:       Variable  Placenta:           Anterior  Amniotic Fluid  AFI FV:      Subjectively within normal limits ---------------------------------------------------------------------- Gestational Age (Fetus B)  LMP:           14w 0d        Date:  11/17/16                 EDD:    08/24/17  Best:          Dot Been 0d     Det. By:  Marcella Dubs         EDD:    08/31/17                                      (01/21/17) ---------------------------------------------------------------------- 1st Trimester Genetic Sonogram Screening (Fetus B)  CRL:            69.5  mm    G. Age:   13w 0d                 EDD:    08/31/17  Nuc Trans:       1.1  mm  Nasal Bone:                 Present ---------------------------------------------------------------------- Anatomy (Fetus B)  Cranium:               Appears normal         Bladder:                Appears normal  Choroid Plexus:        Appears normal         Upper Extremities:      Visualized  Stomach:  Appears normal, left   Lower Extremities:      Visualized                         sided ---------------------------------------------------------------------- Cervix Uterus Adnexa  Cervix  Normal appearance by transabdominal scan.  Uterus  No abnormality visualized.  Left Ovary  Within normal limits.  Right Ovary  Within normal limits. ---------------------------------------------------------------------- Impression  Dichorionic/diamniotic twin pregnancy at 13+0 weeks  No gross abnormalities identified x 2  NT measurement was within normal limits for this GA x 2; NB  present x 2  Normal amniotic fluid volume x 2  Measurements consistent with prior US x 2 ----------------------------------------------------------------------  Recommendations  Offer MSAFP in the second trimester for ONTD screening  Offer detailed U/S by 18 weeks ----------------------------------------------------------------------                 Particia Nearing, MD Electronically Signed Final Report   02/24/2017 08:01 am ----------------------------------------------------------------------   Speciality Comments: No specialty comments available.    Procedures:  No procedures performed Allergies: Effexor [venlafaxine]   Assessment / Plan:     Visit Diagnoses: No diagnosis found.    Orders: No orders of the defined types were placed in this encounter.  No orders of the defined types were placed in this encounter.   Face-to-face time spent with patient was *** minutes. 50% of time was spent in counseling and coordination of care.  Follow-Up Instructions: No Follow-up on file.   Ellen Henri, CMA  Note - This record has been created using Animal nutritionist.  Chart creation errors have been sought, but may not always  have been located. Such creation errors do not reflect on  the standard of medical care.

## 2017-03-25 ENCOUNTER — Other Ambulatory Visit: Payer: Self-pay | Admitting: *Deleted

## 2017-03-25 MED ORDER — LEVOTHYROXINE SODIUM 50 MCG PO TABS: ORAL_TABLET | ORAL | 2 refills | Status: DC

## 2017-03-25 NOTE — Progress Notes (Signed)
Fax received from [facility] stating that Levothyroxine 100 mcg is on backorder and not available. Request for change in strength to 50 mcg tablets. New Rx e-prescribed

## 2017-03-30 ENCOUNTER — Other Ambulatory Visit: Payer: Self-pay | Admitting: Rheumatology

## 2017-03-30 MED ORDER — CERTOLIZUMAB PEGOL 2 X 200 MG/ML ~~LOC~~ KIT: 400.0000 mg | PACK | SUBCUTANEOUS | 0 refills | Status: DC

## 2017-03-30 NOTE — Telephone Encounter (Signed)
ok 

## 2017-03-30 NOTE — Telephone Encounter (Signed)
Last Visit: 01/24/17 Next visit: 04/06/17 Labs: 03/11/17 Creat 0.36  TB Gold: 07/07/16 Neg  Okay to refill Cimzia?

## 2017-04-04 NOTE — Progress Notes (Signed)
Office Visit Note  Patient: Lori Valentine             Date of Birth: 27-Nov-1985           MRN: [id number]             PCP: Berton Bon, MD Referring: Berton Bon, MD Visit Date: 04/18/2017 Occupation: @GUAROCC @    Subjective:  Pain in multiple joints   History of Present Illness: Lori Valentine is a 31 y.o. female with history of seropositive rheumatoid arthritis.  Patient states she started the loading dose of Cimzia on 01/24/17 and has been on it since.  She is due for her next injection, but she does not feel like she is having any benefits from taking it and does not want to restart it.  She states she has been on and off prednisone tapers, but she does not want to take Prednisone anymore.  She continues to have bilateral hand pain and knee pain.  She has noticed hand swelling.  She has previously failed PLQ.    Activities of Daily Living:  Patient reports morning stiffness for 1 hour.   Patient Denies nocturnal pain.  Difficulty dressing/grooming: Denies Difficulty climbing stairs: Reports Difficulty getting out of chair: Reports Difficulty using hands for taps, buttons, cutlery, and/or writing: Reports   Review of Systems  Constitutional: Positive for fatigue. Negative for weakness.  HENT: Negative for mouth sores, mouth dryness and nose dryness.   Eyes: Negative for redness and dryness.  Respiratory: Positive for cough (URI ). Negative for hemoptysis, shortness of breath and difficulty breathing.   Cardiovascular: Positive for swelling in legs/feet. Negative for chest pain, palpitations, hypertension and irregular heartbeat.  Gastrointestinal: Negative for blood in stool, constipation and diarrhea.  Endocrine: Negative for increased urination.  Genitourinary: Negative for painful urination.  Musculoskeletal: Positive for arthralgias, joint pain, joint swelling and morning stiffness. Negative for myalgias, muscle weakness, muscle tenderness and myalgias.    Skin: Negative for color change, pallor, rash, hair loss, nodules/bumps, redness, skin tightness, ulcers and sensitivity to sunlight.  Neurological: Negative for dizziness, numbness and headaches.  Hematological: Negative for swollen glands.  Psychiatric/Behavioral: Negative for depressed mood and sleep disturbance. The patient is not nervous/anxious.     PMFS History:  Patient Active Problem List   Diagnosis Date Noted  . Obesity in pregnancy, antepartum 03/11/2017  . Maternal rheumatoid arthritis complicating pregnancy (HCC) 03/11/2017  . Supervision of high-risk pregnancy 02/08/2017  . 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 Vaginal ulcer 12/29/2016  . History of anxiety and depression 10/22/2016  . Smoker 10/22/2016  . Rheumatoid arthritis (HCC) 05/13/2015  . Hypothyroidism affecting pregnancy 07/29/2014    Past Medical History:  Diagnosis Date  . Hypothyroid   . Rheumatoid arthritis (HCC)     Family History  Problem Relation Age of Onset  . Schizophrenia Mother   . Bipolar disorder Mother   . Diabetes Other   . Heart disease Other    History reviewed. No pertinent surgical history. Social History   Social History Narrative   50 year old son   Lives with  Sister and nephew and son.   Works as 10 at [employer]     Objective: Vital Signs: BP 116/72 (BP Location: Left Arm, Patient Position: Sitting, Cuff Size: Normal)   Pulse 84   Resp 20   Ht 5\' 7"  (1.702 m)   Wt 272 lb (123.4 kg)   LMP 11/17/2016 (Approximate)   BMI 42.60  kg/m    Physical Exam  Constitutional: She is oriented to person, place, and time. She appears well-developed and well-nourished.  HENT:  Head: Normocephalic and atraumatic.  Eyes: Conjunctivae and EOM are normal.  Neck: Normal range of motion.  Cardiovascular: Normal rate, regular rhythm, normal heart sounds and intact distal pulses.  Pulmonary/Chest: Effort normal and breath sounds normal.  Abdominal:  Soft. Bowel sounds are normal.  Lymphadenopathy:    She has no cervical adenopathy.  Neurological: She is alert and oriented to person, place, and time.  Skin: Skin is warm and dry. Capillary refill takes less than 2 seconds.  Psychiatric: She has a normal mood and affect. Her behavior is normal.  Nursing note and vitals reviewed.    Musculoskeletal Exam: C-spine, thoracic, and lumbar spine good ROM.  Shoulder joints, elbow joints, left wrist, MCPs, PIPs, and DIPs good ROM.  Right wrist limited flexion and extension.  Hip joints, knee joints, ankle joints, MTPs, PIPs, and DIPs good ROM with no synovitis.  Pedal edema bilaterally.  No midline spinal tenderness.  No SI joint tenderness.    CDAI Exam: CDAI Homunculus Exam:   Tenderness:  RUE: wrist Right hand: 1st MCP, 2nd MCP, 3rd MCP, 4th MCP and 5th MCP Left hand: 1st MCP, 2nd MCP, 3rd MCP, 4th MCP and 5th MCP RLE: tibiofemoral LLE: tibiofemoral  Swelling:  RUE: wrist  Joint Counts:  CDAI Tender Joint count: 13 CDAI Swollen Joint count: 1  Global Assessments:  Patient Global Assessment: 3 Provider Global Assessment: 3  CDAI Calculated Score: 20    Investigation: No additional findings.TB gold: 07/07/2016 Negative  CBC Latest Ref Rng & Units 02/08/2017 10/27/2016 09/01/2016  WBC 3.4 - 10.8 x10E3/uL 9.6 5.1 3.2(L)  Hemoglobin 11.1 - 15.9 g/dL 96.0 45.4 09.8  Hematocrit 34.0 - 46.6 % 37.5 38.0 36.9  Platelets 150 - 379 x10E3/uL 272 296 231   CMP Latest Ref Rng & Units 03/11/2017 12/02/2016 10/27/2016  Glucose 65 - 99 mg/dL 79 119(J) 92  BUN 6 - 20 mg/dL 6 18 14   Creatinine 0.57 - 1.00 mg/dL 4.78(G) 9.56 2.13  Sodium 134 - 144 mmol/L 137 136 138  Potassium 3.5 - 5.2 mmol/L 3.9 4.4 3.6  Chloride 96 - 106 mmol/L 105 105 109  CO2 20 - 29 mmol/L 21 20 22   Calcium 8.7 - 10.2 mg/dL 0.8(M) 8.9 5.7(Q)  Total Protein 6.0 - 8.5 g/dL 5.9(L) 7.2 6.8  Total Bilirubin 0.0 - 1.2 mg/dL <4.6 0.3 0.5  Alkaline Phos 39 - 117 IU/L 53 90 77    AST 0 - 40 IU/L 15 19 84(H)  ALT 0 - 32 IU/L 17 22 75(H)    Imaging: [place] Mfm Ob Transvaginal  Result Date: 04/07/2017 ----------------------------------------------------------------------  OBSTETRICS REPORT                        (Corrected Final 04/07/2017 11:04                                                                          am) ---------------------------------------------------------------------- Patient Info  ID #:       [id number]  D.O.B.:  February 23, 1986 (31 yrs)  Name:       TONIKA Deleo                    Visit Date: 04/07/2017 09:43 am ---------------------------------------------------------------------- Performed By  Performed By:     Marcellina Millin          Ref. Address:     [address] [place],                                                             [place] [address]  Attending:        Erle Crocker MD     Location:         [facility]  Referred By:      Marny Lowenstein                    [place] ---------------------------------------------------------------------- Orders   #  Description                                 Code   1  [place] MFM OB DETAIL +14 WK                     76811.01   2  [place] MFM OB DETAIL ADDL GEST +14              24401.02      WK   3  [place] MFM OB TRANSVAGINAL                      72536.6  ----------------------------------------------------------------------   #  Ordered By               Order #        Accession #    Episode #   1  Jaynie Collins           [id number]      [id number]     [id number]   2  UGONNA ANYANWU           [id number]      [id number]     [id number]   3  UGONNA ANYANWU           [id number]      [id number]     [id number]  ---------------------------------------------------------------------- Indications   [redacted] weeks gestation of pregnancy                Z3A.19   Twin pregnancy, di/di, second trimester        O30.042   Hypothyroid (Synthroid)                         O99.280 E03.9  Obesity complicating pregnancy, second         O99.212   trimester (Prepreg BMI 36.3)   Encounter for fetal anatomic survey            Z36.89  ---------------------------------------------------------------------- OB History  Blood Type:            Height:  5'8"   Weight (lb):  255       BMI:  38.77  Gravidity:    2         Term:   1  Living:       1 ---------------------------------------------------------------------- Fetal Evaluation (Fetus A)  Num Of Fetuses:     2  Fetal Heart         158  Rate(bpm):  Cardiac Activity:   Observed  Fetal Lie:          Maternal right side  Presentation:       Cephalic  Placenta:           Posterior, above cervical os  P. Cord Insertion:  Visualized  Membrane Desc:      Dividing Membrane seen - Dichorionic.  Amniotic Fluid  AFI FV:      Subjectively within normal limits ---------------------------------------------------------------------- Biometry (Fetus A)  BPD:      41.6  mm     G. Age:  18w 4d         27  %    CI:        68.11   %    70 - 86                                                          FL/HC:      19.3   %    16.1 - 18.3  HC:      161.2  mm     G. Age:  18w 6d         32  %    HC/AC:      1.15        1.09 - 1.39  AC:      139.7  mm     G. Age:  19w 3d         53  %    FL/BPD:     74.8   %  FL:       31.1  mm     G. Age:  19w 4d         61  %    FL/AC:      22.3   %    20 - 24  HUM:      29.6  mm     G. Age:  19w 5d         65  %  CER:        20  mm     G. Age:  19w 0d         48  %  Est. FW:     291  gm    0 lb 10 oz      50  %     FW Discordancy         2  % ---------------------------------------------------------------------- Gestational Age (Fetus A)  LMP:  20w 1d        Date:  11/17/16                 EDD:   08/24/17  U/S Today:     19w 1d                                        EDD:   08/31/17  Best:          19w 1d     Det. ByMarcella Dubs         EDD:   08/31/17                                      (01/21/17)  ---------------------------------------------------------------------- Anatomy (Fetus A)  Cranium:               Appears normal         LVOT:                   Not well visualized  Cavum:                 Not well visualized    Aortic Arch:            Not well visualized  Ventricles:            Not well visualized    Ductal Arch:            Appears normal  Choroid Plexus:        Appears normal         Diaphragm:              Appears normal  Cerebellum:            Appears normal         Stomach:                Appears normal, left                                                                        sided  Posterior Fossa:       Appears normal         Abdomen:                Appears normal  Nuchal Fold:           Appears normal         Abdominal Wall:         Not well visualized  Face:                  Appears normal         Cord Vessels:           Appears normal (3                         (orbits and profile)  vessel cord)  Lips:                  Not well visualized    Kidneys:                Appear normal  Palate:                Not well visualized    Bladder:                Appears normal  Thoracic:              Appears normal         Spine:                  Appears normal  Heart:                 Not well visualized    Upper Extremities:      Appears normal;                                                                        hands nws  RVOT:                  Not well visualized    Lower Extremities:      Appears normal;                                                                        ankles nws  Other:  Fetus appears to be a female. ---------------------------------------------------------------------- Fetal Evaluation (Fetus B)  Num Of Fetuses:     2  Cardiac Activity:   Observed  Fetal Lie:          Maternal left side  Presentation:       Variable  Placenta:           Anterior, above cervical os  P. Cord Insertion:  Visualized  Membrane Desc:      Dividing Membrane seen -  Dichorionic.  Amniotic Fluid  AFI FV:      Subjectively within normal limits ---------------------------------------------------------------------- Biometry (Fetus B)  BPD:      44.5  mm     G. Age:  19w 3d         64  %    CI:        71.41   %    70 - 86                                                          FL/HC:      17.9   %    16.1 - 18.3  HC:      167.7  mm     G. Age:  19w 3d  57  %    HC/AC:      1.16        1.09 - 1.39  AC:       144   mm     G. Age:  19w 5d         65  %    FL/BPD:     67.4   %  FL:         30  mm     G. Age:  19w 2d         47  %    FL/AC:      20.8   %    20 - 24  HUM:      31.5  mm     G. Age:  20w 4d         86  %  CER:      21.1  mm     G. Age:  20w 1d         70  %  Est. FW:     296  gm    0 lb 10 oz      52  %     FW Discordancy      0 \ 2 % ---------------------------------------------------------------------- Gestational Age (Fetus B)  LMP:           20w 1d        Date:  11/17/16                 EDD:   08/24/17  U/S Today:     19w 3d                                        EDD:   08/29/17  Best:          19w 1d     Det. ByMarcella Dubs         EDD:   08/31/17                                      (01/21/17) ---------------------------------------------------------------------- Anatomy (Fetus B)  Cranium:               Appears normal         LVOT:                   Appears normal  Cavum:                 Not well visualized    Aortic Arch:            Appears normal  Ventricles:            Appears normal         Ductal Arch:            Appears normal  Choroid Plexus:        Appears normal         Diaphragm:              Not well visualized  Cerebellum:            Appears normal         Stomach:                Appears normal, left  sided  Posterior Fossa:       Appears normal         Abdomen:                Appears normal  Nuchal Fold:           Appears normal         Abdominal Wall:         Appears  nml (cord                                                                        insert, abd wall)  Face:                  Appears normal         Cord Vessels:           Appears normal (3                         (orbits and profile)                           vessel cord)  Lips:                  Appears normal         Kidneys:                Appear normal  Palate:                Not well visualized    Bladder:                Appears normal  Thoracic:              Appears normal         Spine:                  Appears normal  Heart:                 Not well visualized    Upper Extremities:      Appears normal;                                                                        hands nws  RVOT:                  Not well visualized    Lower Extremities:      Appears normal  Other:  Fetus appears to be a female. Heels visualized. Right 5th visualized. ---------------------------------------------------------------------- Cervix Uterus Adnexa  Cervix  Length:              4  cm.  Normal appearance by transabdominal scan. ---------------------------------------------------------------------- Impression  Indication: 31 yr old G2P1001 at 109w1d with  dichorionic/diamniotic twin gestation for fetal anatomic survey.  Findings:  1. Dichorionic/diamniotic twin gestation; the dividing  membrane is seen.  2. Fetal  biometry for both fetuses is consistent with dating.  3. Twin A with a posterior placenta; twin B with an anterior  placenta; there is no evidence of previa.  4. Normal amniotic fluid volume.  5. Normal transvaginal cervical length.  6. The anatomy surveys for both fetuses are limited as  above; no abnormalities seen on limited exam. ---------------------------------------------------------------------- Recommendations  1. Appropriate fetal growth.  2. Limited anatomy surveys:  - recommend follow up in 4 weeks to reevaluate fetal anatomy  3. Twin gestation:  - recommend fetal growth every 4 weeks  - recommend preterm  labor precautions  - recommend delivery at 38 weeks or sooner if clinically  indicated  4. Normal MSAFP; per chart normal first trimester screens x2  (only can see result for twin A)  5. Rheumatoid arthritis:  - recommend serial fetal growth as above ----------------------------------------------------------------------                     Erle CrockerKristen H Quinn, MD Electronically Signed Corrected Final Report  04/07/2017 11:04 am ----------------------------------------------------------------------  [place] Mfm Ob Detail +14 Wk  Result Date: 04/07/2017 ----------------------------------------------------------------------  OBSTETRICS REPORT                        (Corrected Final 04/07/2017 11:04                                                                          am) ---------------------------------------------------------------------- Patient Info  ID #:       [id number]                          D.O.B.:  [birth year] (31 yrs)  Name:       Molly MaduroSANDRA Bilal                    Visit Date: 04/07/2017 09:43 am ---------------------------------------------------------------------- Performed By  Performed By:     Marcellina MillinKelly L Moser          Ref. Address:     [address]                    [address]                                                             [address] [place],                                                             [place] [id number]  Attending:        Erle CrockerKristen H Quinn MD     [facility]  Referred By:      Marny LowensteinJULIE N WENZEL  [place] ---------------------------------------------------------------------- Orders   #  Description                                 Code   1  [place] MFM OB DETAIL +14 WK                     76811.01   2  [place] MFM OB DETAIL ADDL GEST +14              50932.67      WK   3  [place] MFM OB TRANSVAGINAL                      12458.0  ----------------------------------------------------------------------   #  Ordered By               Order #        Accession #    Episode #   1  Jaynie Collins           [id number]      [id number]     [id number]   2  Jaynie Collins           [id number]      [id number]     [id number]   3  UGONNA ANYANWU           [id number]      [id number]     [id number]  ---------------------------------------------------------------------- Indications   [redacted] weeks gestation of pregnancy                Z3A.19   Twin pregnancy, di/di, second trimester        O30.042   Hypothyroid (Synthroid)                        O99.280 E03.9   Obesity complicating pregnancy, second         O99.212   trimester (Prepreg BMI 36.3)   Encounter for fetal anatomic survey            Z36.89  ---------------------------------------------------------------------- OB History  Blood Type:            Height:  5'8"   Weight (lb):  255       BMI:  38.77  Gravidity:    2         Term:   1  Living:       1 ---------------------------------------------------------------------- Fetal Evaluation (Fetus A)  Num Of Fetuses:     2  Fetal Heart         158  Rate(bpm):  Cardiac Activity:   Observed  Fetal Lie:          Maternal right side  Presentation:       Cephalic  Placenta:           Posterior, above cervical os  P. Cord Insertion:  Visualized  Membrane Desc:      Dividing Membrane seen - Dichorionic.  Amniotic Fluid  AFI FV:      Subjectively within normal limits ---------------------------------------------------------------------- Biometry (Fetus A)  BPD:      41.6  mm     G. Age:  18w 4d         27  %    CI:        68.11   %    70 - 86  FL/HC:      19.3   %    16.1 - 18.3  HC:      161.2  mm     G. Age:  18w 6d         32  %    HC/AC:      1.15        1.09 - 1.39  AC:      139.7  mm     G. Age:  19w 3d         53  %    FL/BPD:     74.8   %  FL:       31.1  mm     G. Age:  19w 4d         61  %    FL/AC:      22.3   %    20 - 24  HUM:      29.6  mm     G. Age:  19w 5d         65  %  CER:        20  mm     G. Age:  19w 0d         48  %  Est. FW:     291  gm    0 lb 10  oz      50  %     FW Discordancy         2  % ---------------------------------------------------------------------- Gestational Age (Fetus A)  LMP:           20w 1d        Date:  11/17/16                 EDD:   08/24/17  U/S Today:     19w 1d                                        EDD:   08/31/17  Best:          19w 1d     Det. ByMarcella Dubs         EDD:   08/31/17                                      (01/21/17) ---------------------------------------------------------------------- Anatomy (Fetus A)  Cranium:               Appears normal         LVOT:                   Not well visualized  Cavum:                 Not well visualized    Aortic Arch:            Not well visualized  Ventricles:            Not well visualized    Ductal Arch:            Appears normal  Choroid Plexus:        Appears normal         Diaphragm:              Appears normal  Cerebellum:  Appears normal         Stomach:                Appears normal, left                                                                        sided  Posterior Fossa:       Appears normal         Abdomen:                Appears normal  Nuchal Fold:           Appears normal         Abdominal Wall:         Not well visualized  Face:                  Appears normal         Cord Vessels:           Appears normal (3                         (orbits and profile)                           vessel cord)  Lips:                  Not well visualized    Kidneys:                Appear normal  Palate:                Not well visualized    Bladder:                Appears normal  Thoracic:              Appears normal         Spine:                  Appears normal  Heart:                 Not well visualized    Upper Extremities:      Appears normal;                                                                        hands nws  RVOT:                  Not well visualized    Lower Extremities:      Appears normal;  ankles nws  Other:  Fetus appears to be a female. ---------------------------------------------------------------------- Fetal Evaluation (Fetus B)  Num Of Fetuses:     2  Cardiac Activity:   Observed  Fetal Lie:          Maternal left side  Presentation:       Variable  Placenta:           Anterior, above cervical os  P. Cord Insertion:  Visualized  Membrane Desc:      Dividing Membrane seen - Dichorionic.  Amniotic Fluid  AFI FV:      Subjectively within normal limits ---------------------------------------------------------------------- Biometry (Fetus B)  BPD:      44.5  mm     G. Age:  19w 3d         64  %    CI:        71.41   %    70 - 86                                                          FL/HC:      17.9   %    16.1 - 18.3  HC:      167.7  mm     G. Age:  19w 3d         57  %    HC/AC:      1.16        1.09 - 1.39  AC:       144   mm     G. Age:  19w 5d         65  %    FL/BPD:     67.4   %  FL:         30  mm     G. Age:  19w 2d         47  %    FL/AC:      20.8   %    20 - 24  HUM:      31.5  mm     G. Age:  20w 4d         86  %  CER:      21.1  mm     G. Age:  20w 1d         70  %  Est. FW:     296  gm    0 lb 10 oz      52  %     FW Discordancy      0 \ 2 % ---------------------------------------------------------------------- Gestational Age (Fetus B)  LMP:           20w 1d        Date:  11/17/16                 EDD:   08/24/17  U/S Today:     19w 3d                                        EDD:   08/29/17  Best:          19w 1d     Det. By:  Early Ultrasound         EDD:   08/31/17                                      (01/21/17) ---------------------------------------------------------------------- Anatomy (Fetus B)  Cranium:               Appears normal         LVOT:                   Appears normal  Cavum:                 Not well visualized    Aortic Arch:            Appears normal  Ventricles:            Appears normal         Ductal Arch:            Appears normal  Choroid  Plexus:        Appears normal         Diaphragm:              Not well visualized  Cerebellum:            Appears normal         Stomach:                Appears normal, left                                                                        sided  Posterior Fossa:       Appears normal         Abdomen:                Appears normal  Nuchal Fold:           Appears normal         Abdominal Wall:         Appears nml (cord                                                                        insert, abd wall)  Face:                  Appears normal         Cord Vessels:           Appears normal (3                         (orbits and profile)                           vessel cord)  Lips:                  Appears normal  Kidneys:                Appear normal  Palate:                Not well visualized    Bladder:                Appears normal  Thoracic:              Appears normal         Spine:                  Appears normal  Heart:                 Not well visualized    Upper Extremities:      Appears normal;                                                                        hands nws  RVOT:                  Not well visualized    Lower Extremities:      Appears normal  Other:  Fetus appears to be a female. Heels visualized. Right 5th visualized. ---------------------------------------------------------------------- Cervix Uterus Adnexa  Cervix  Length:              4  cm.  Normal appearance by transabdominal scan. ---------------------------------------------------------------------- Impression  Indication: 31 yr old G2P1001 at [redacted]w[redacted]d with  dichorionic/diamniotic twin gestation for fetal anatomic survey.  Findings:  1. Dichorionic/diamniotic twin gestation; the dividing  membrane is seen.  2. Fetal biometry for both fetuses is consistent with dating.  3. Twin A with a posterior placenta; twin B with an anterior  placenta; there is no evidence of previa.  4. Normal amniotic fluid volume.  5. Normal  transvaginal cervical length.  6. The anatomy surveys for both fetuses are limited as  above; no abnormalities seen on limited exam. ---------------------------------------------------------------------- Recommendations  1. Appropriate fetal growth.  2. Limited anatomy surveys:  - recommend follow up in 4 weeks to reevaluate fetal anatomy  3. Twin gestation:  - recommend fetal growth every 4 weeks  - recommend preterm labor precautions  - recommend delivery at 38 weeks or sooner if clinically  indicated  4. Normal MSAFP; per chart normal first trimester screens x2  (only can see result for twin A)  5. Rheumatoid arthritis:  - recommend serial fetal growth as above ----------------------------------------------------------------------                     Erle Crocker, MD Electronically Signed Corrected Final Report  04/07/2017 11:04 am ----------------------------------------------------------------------  [place] Mfm Ob Detail Addl Gest +14 Wk  Result Date: 04/07/2017 ----------------------------------------------------------------------  OBSTETRICS REPORT                        (Corrected Final 04/07/2017 11:04  am) ---------------------------------------------------------------------- Patient Info  ID #:       [id number]                          D.O.B.:  Aug 08, 1985 (31 yrs)  Name:       Dois Davenport Tumblin                    Visit Date: 04/07/2017 09:43 am ---------------------------------------------------------------------- Performed By  Performed By:     Marcellina Millin          Ref. Address:     [address] [place],                                                             [place] [address]  Attending:        Erle Crocker MD     Location:         [facility]  Referred By:      Marny Lowenstein                    [place]  ---------------------------------------------------------------------- Orders   #  Description                                 Code   1  [place] MFM OB DETAIL +14 WK                     76811.01   2  [place] MFM OB DETAIL ADDL GEST +14              19147.82      WK   3  [place] MFM OB TRANSVAGINAL                      95621.3  ----------------------------------------------------------------------   #  Ordered By               Order #        Accession #    Episode #   1  Jaynie Collins           [id number]      [id number]     [id number]   2  Jaynie Collins           [id number]      [id number]     [id number]   3  UGONNA ANYANWU           [id number]      [id number]     [id number]  ---------------------------------------------------------------------- Indications   [redacted] weeks gestation of pregnancy                Z3A.19   Twin pregnancy, di/di, second trimester  O30.042   Hypothyroid (Synthroid)                        O99.280 E03.9   Obesity complicating pregnancy, second         O99.212   trimester (Prepreg BMI 36.3)   Encounter for fetal anatomic survey            Z36.89  ---------------------------------------------------------------------- OB History  Blood Type:            Height:  5'8"   Weight (lb):  255       BMI:  38.77  Gravidity:    2         Term:   1  Living:       1 ---------------------------------------------------------------------- Fetal Evaluation (Fetus A)  Num Of Fetuses:     2  Fetal Heart         158  Rate(bpm):  Cardiac Activity:   Observed  Fetal Lie:          Maternal right side  Presentation:       Cephalic  Placenta:           Posterior, above cervical os  P. Cord Insertion:  Visualized  Membrane Desc:      Dividing Membrane seen - Dichorionic.  Amniotic Fluid  AFI FV:      Subjectively within normal limits ---------------------------------------------------------------------- Biometry (Fetus A)  BPD:      41.6  mm     G. Age:  18w 4d         27  %    CI:        68.11   %    70 - 86                                                           FL/HC:      19.3   %    16.1 - 18.3  HC:      161.2  mm     G. Age:  18w 6d         32  %    HC/AC:      1.15        1.09 - 1.39  AC:      139.7  mm     G. Age:  19w 3d         53  %    FL/BPD:     74.8   %  FL:       31.1  mm     G. Age:  19w 4d         61  %    FL/AC:      22.3   %    20 - 24  HUM:      29.6  mm     G. Age:  19w 5d         65  %  CER:        20  mm     G. Age:  19w 0d         48  %  Est. FW:     291  gm    0 lb 10 oz      50  %  FW Discordancy         2  % ---------------------------------------------------------------------- Gestational Age (Fetus A)  LMP:           20w 1d        Date:  11/17/16                 EDD:   08/24/17  U/S Today:     19w 1d                                        EDD:   08/31/17  Best:          19w 1d     Det. ByMarcella Dubs         EDD:   08/31/17                                      (01/21/17) ---------------------------------------------------------------------- Anatomy (Fetus A)  Cranium:               Appears normal         LVOT:                   Not well visualized  Cavum:                 Not well visualized    Aortic Arch:            Not well visualized  Ventricles:            Not well visualized    Ductal Arch:            Appears normal  Choroid Plexus:        Appears normal         Diaphragm:              Appears normal  Cerebellum:            Appears normal         Stomach:                Appears normal, left                                                                        sided  Posterior Fossa:       Appears normal         Abdomen:                Appears normal  Nuchal Fold:           Appears normal         Abdominal Wall:         Not well visualized  Face:                  Appears normal         Cord Vessels:           Appears normal (3                         (  orbits and profile)                           vessel cord)  Lips:                  Not well visualized    Kidneys:                Appear normal  Palate:                 Not well visualized    Bladder:                Appears normal  Thoracic:              Appears normal         Spine:                  Appears normal  Heart:                 Not well visualized    Upper Extremities:      Appears normal;                                                                        hands nws  RVOT:                  Not well visualized    Lower Extremities:      Appears normal;                                                                        ankles nws  Other:  Fetus appears to be a female. ---------------------------------------------------------------------- Fetal Evaluation (Fetus B)  Num Of Fetuses:     2  Cardiac Activity:   Observed  Fetal Lie:          Maternal left side  Presentation:       Variable  Placenta:           Anterior, above cervical os  P. Cord Insertion:  Visualized  Membrane Desc:      Dividing Membrane seen - Dichorionic.  Amniotic Fluid  AFI FV:      Subjectively within normal limits ---------------------------------------------------------------------- Biometry (Fetus B)  BPD:      44.5  mm     G. Age:  19w 3d         64  %    CI:        71.41   %    70 - 86                                                          FL/HC:      17.9   %  16.1 - 18.3  HC:      167.7  mm     G. Age:  19w 3d         57  %    HC/AC:      1.16        1.09 - 1.39  AC:       144   mm     G. Age:  19w 5d         65  %    FL/BPD:     67.4   %  FL:         30  mm     G. Age:  19w 2d         47  %    FL/AC:      20.8   %    20 - 24  HUM:      31.5  mm     G. Age:  20w 4d         86  %  CER:      21.1  mm     G. Age:  20w 1d         70  %  Est. FW:     296  gm    0 lb 10 oz      52  %     FW Discordancy      0 \ 2 % ---------------------------------------------------------------------- Gestational Age (Fetus B)  LMP:           20w 1d        Date:  11/17/16                 EDD:   08/24/17  U/S Today:     19w 3d                                        EDD:   08/29/17  Best:           19w 1d     Det. ByMarcella Dubs         EDD:   08/31/17                                      (01/21/17) ---------------------------------------------------------------------- Anatomy (Fetus B)  Cranium:               Appears normal         LVOT:                   Appears normal  Cavum:                 Not well visualized    Aortic Arch:            Appears normal  Ventricles:            Appears normal         Ductal Arch:            Appears normal  Choroid Plexus:        Appears normal         Diaphragm:              Not well visualized  Cerebellum:  Appears normal         Stomach:                Appears normal, left                                                                        sided  Posterior Fossa:       Appears normal         Abdomen:                Appears normal  Nuchal Fold:           Appears normal         Abdominal Wall:         Appears nml (cord                                                                        insert, abd wall)  Face:                  Appears normal         Cord Vessels:           Appears normal (3                         (orbits and profile)                           vessel cord)  Lips:                  Appears normal         Kidneys:                Appear normal  Palate:                Not well visualized    Bladder:                Appears normal  Thoracic:              Appears normal         Spine:                  Appears normal  Heart:                 Not well visualized    Upper Extremities:      Appears normal;                                                                        hands nws  RVOT:  Not well visualized    Lower Extremities:      Appears normal  Other:  Fetus appears to be a female. Heels visualized. Right 5th visualized. ---------------------------------------------------------------------- Cervix Uterus Adnexa  Cervix  Length:              4  cm.  Normal appearance by transabdominal scan.  ---------------------------------------------------------------------- Impression  Indication: 31 yr old G2P1001 at [redacted]w[redacted]d with  dichorionic/diamniotic twin gestation for fetal anatomic survey.  Findings:  1. Dichorionic/diamniotic twin gestation; the dividing  membrane is seen.  2. Fetal biometry for both fetuses is consistent with dating.  3. Twin A with a posterior placenta; twin B with an anterior  placenta; there is no evidence of previa.  4. Normal amniotic fluid volume.  5. Normal transvaginal cervical length.  6. The anatomy surveys for both fetuses are limited as  above; no abnormalities seen on limited exam. ---------------------------------------------------------------------- Recommendations  1. Appropriate fetal growth.  2. Limited anatomy surveys:  - recommend follow up in 4 weeks to reevaluate fetal anatomy  3. Twin gestation:  - recommend fetal growth every 4 weeks  - recommend preterm labor precautions  - recommend delivery at 38 weeks or sooner if clinically  indicated  4. Normal MSAFP; per chart normal first trimester screens x2  (only can see result for twin A)  5. Rheumatoid arthritis:  - recommend serial fetal growth as above ----------------------------------------------------------------------                     Erle Crocker, MD Electronically Signed Corrected Final Report  04/07/2017 11:04 am ----------------------------------------------------------------------   Speciality Comments: No specialty comments available.    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  She has synovitis of her right wrist.  She continues to have discomfort in all of her bilateral MCP joints.  She would like to discontinue Cimzia due to not noticing a benefit and being worried about side effects. Patient states that she gets  too much anxiety from taking subcutaneous injection. Different treatment options and their side effects were discussed at length. I discussed the option of sulfasalazine, Plaquenil combination or adding Plaquenil to Cimzia. After reviewing indications side effects contraindications of all the medication she decided to stay on Cimzia and add Plaquenil to it. Handout was given consent was taken.  We will start her on Plaquenil 200 mg BID daily.  Consent was taken.  She was given an eye exam form and advise to schedule an appointment with her ophthalmologist.  A prescription for PLQ was sent to the pharmacy.  I also offered a referral to a tertiary care center for second opinion but patient declined.  Patient was counseled on the purpose, proper use, and adverse effects of hydroxychloroquine including nausea/diarrhea, skin rash, headaches, and sun sensitivity.  Discussed importance of annual eye exams while on hydroxychloroquine to monitor to ocular toxicity and discussed importance of frequent laboratory monitoring.  Provided patient with eye exam form for baseline ophthalmologic exam.  Provided patient with educational materials on hydroxychloroquine and answered all questions.  Patient consented to hydroxychloroquine.  Will upload consent in the media tab.    High risk medication use - She is discontinuing cimzia.  She will be started on PLQ 200 mg BID daily. CBC, CMP, and G6PD were drawn today. (Remicade IV 3mg / Kg inadequate response, Enbrel -  inadq response).  Pregnancy, unspecified gestational age - Patient is pregnant with twins  Anxiety state - She is very anxious. She's having difficulty with mobility.    Other medical conditions are listed as follows:   History of depression  History of hypothyroidism: A TSH level was drawn today per request of her gynecologist.   Former smoker    Orders: Orders Placed This Encounter  Procedures  . TSH  . CBC with Differential/Platelet  . COMPLETE  METABOLIC PANEL WITH GFR  . Glucose 6 phosphate dehydrogenase   Meds ordered this encounter  Medications  . hydroxychloroquine (PLAQUENIL) 200 MG tablet    Sig: Take 1 tablet (200 mg total) by mouth 2 (two) times daily.    Dispense:  60 tablet    Refill:  2    Face-to-face time spent with patient was 30 minutes. Greater than 50% of time was spent in counseling and coordination of care.  Follow-Up Instructions: Return in about 2 months (around 06/16/2017) for Rheumatoid arthritis.   Pollyann Savoy, MD  Note - This record has been created using Animal nutritionist.  Chart creation errors have been sought, but may not always  have been located. Such creation errors do not reflect on  the standard of medical care.

## 2017-04-05 NOTE — L&D Delivery Note (Signed)
Patient is 32 y.o. A2N0539 [redacted]w[redacted]d admitted for PPROM with di/di twins. S/p IOL augmentation with Pitocin. AROM at 0100.  Prenatal course also complicated by twin pregnancy, hypothyroidism, RA, obesity, anemia, and HTN.    Lori Valentine, Lori Valentine [[id number]]   TWIN A Delivered Vaginally in room.   Delivery Note At 11:26 AM a viable female was delivered via Vaginal, Spontaneous (Presentation: ROA).  APGAR: 8, 9; weight 4 lb 12.9 oz (2180 g).   Placenta status: Intact.  Cord: 3V with the following complications: None.  Cord pH: N/A  Anesthesia: Epidural   Episiotomy: None Lacerations:  None Suture Repair: None Est. Blood Loss (mL): 0  Mom to postpartum.  Baby to Couplet care / Skin to Skin.  Pitocin continued after delivery of twin A. Labored down for about 45 minutes after delivery. On cervical check patient with bulging bag and well applied head. AROM of Twin B occurred at ~12:10. Prolapsed cord appreciated. Dr. Alysia Penna tried to reduce without change. STAT cesarean called.     Lori Valentine, Lori Valentine [[id number]]  Delivery Note At 12:17 PM a viable female was delivered via C-Section, Low Transverse (Presentation: cephalic).  APGAR: 9, 9; weight 5 lb 12.4 oz (2620 g).   Placenta status: Intact.  Cord: 3V with the following complications: None.  Cord pH: 7.2  Anesthesia: General  Episiotomy: None Lacerations: None Suture Repair: None Est. Blood Loss (mL): See op note  Mom to PACU.  Baby to Nursery.  PLEASE SEE OP NOTE FOR ADDITIONAL DETAILS.  Caryl Ada, DO 08/04/2017, 1:18 PM

## 2017-04-06 ENCOUNTER — Ambulatory Visit: Payer: 59 | Admitting: Rheumatology

## 2017-04-07 ENCOUNTER — Other Ambulatory Visit: Payer: Self-pay | Admitting: Obstetrics & Gynecology

## 2017-04-07 ENCOUNTER — Encounter (HOSPITAL_COMMUNITY): Payer: Self-pay

## 2017-04-07 ENCOUNTER — Ambulatory Visit (HOSPITAL_COMMUNITY)
Admission: RE | Admit: 2017-04-07 | Discharge: 2017-04-07 | Disposition: A | Discharge status: Home / Self Care | Payer: Managed Care, Other (non HMO) | Source: Ambulatory Visit | Attending: Obstetrics & Gynecology | Admitting: Obstetrics & Gynecology

## 2017-04-07 DIAGNOSIS — Z3689 Encounter for other specified antenatal screening: Secondary | ICD-10-CM

## 2017-04-07 DIAGNOSIS — O30009 Twin pregnancy, unspecified number of placenta and unspecified number of amniotic sacs, unspecified trimester: Secondary | ICD-10-CM

## 2017-04-07 DIAGNOSIS — O09892 Supervision of other high risk pregnancies, second trimester: Secondary | ICD-10-CM | POA: Diagnosis not present

## 2017-04-07 DIAGNOSIS — O30049 Twin pregnancy, dichorionic/diamniotic, unspecified trimester: Secondary | ICD-10-CM

## 2017-04-07 DIAGNOSIS — E669 Obesity, unspecified: Secondary | ICD-10-CM | POA: Insufficient documentation

## 2017-04-07 DIAGNOSIS — O30042 Twin pregnancy, dichorionic/diamniotic, second trimester: Secondary | ICD-10-CM | POA: Diagnosis present

## 2017-04-07 DIAGNOSIS — O9989 Other specified diseases and conditions complicating pregnancy, childbirth and the puerperium: Secondary | ICD-10-CM | POA: Insufficient documentation

## 2017-04-07 DIAGNOSIS — O99891 Other specified diseases and conditions complicating pregnancy: Secondary | ICD-10-CM

## 2017-04-07 DIAGNOSIS — O99282 Endocrine, nutritional and metabolic diseases complicating pregnancy, second trimester: Secondary | ICD-10-CM | POA: Diagnosis not present

## 2017-04-07 DIAGNOSIS — Z3A19 19 weeks gestation of pregnancy: Secondary | ICD-10-CM

## 2017-04-07 DIAGNOSIS — E039 Hypothyroidism, unspecified: Secondary | ICD-10-CM | POA: Insufficient documentation

## 2017-04-07 DIAGNOSIS — M069 Rheumatoid arthritis, unspecified: Secondary | ICD-10-CM | POA: Diagnosis not present

## 2017-04-07 DIAGNOSIS — O99212 Obesity complicating pregnancy, second trimester: Secondary | ICD-10-CM | POA: Insufficient documentation

## 2017-04-08 ENCOUNTER — Other Ambulatory Visit (HOSPITAL_COMMUNITY): Payer: Self-pay | Admitting: *Deleted

## 2017-04-08 DIAGNOSIS — O30042 Twin pregnancy, dichorionic/diamniotic, second trimester: Secondary | ICD-10-CM

## 2017-04-11 ENCOUNTER — Ambulatory Visit (INDEPENDENT_AMBULATORY_CARE_PROVIDER_SITE_OTHER): Payer: Managed Care, Other (non HMO) | Admitting: Family Medicine

## 2017-04-11 ENCOUNTER — Other Ambulatory Visit (HOSPITAL_COMMUNITY)
Admission: RE | Admit: 2017-04-11 | Discharge: 2017-04-11 | Disposition: A | Discharge status: Home / Self Care | Payer: Managed Care, Other (non HMO) | Source: Ambulatory Visit | Attending: Family Medicine | Admitting: Family Medicine

## 2017-04-11 ENCOUNTER — Encounter: Payer: Self-pay | Admitting: Family Medicine

## 2017-04-11 VITALS — BP 113/68 | HR 96 | Wt 274.0 lb

## 2017-04-11 DIAGNOSIS — O30049 Twin pregnancy, dichorionic/diamniotic, unspecified trimester: Secondary | ICD-10-CM | POA: Insufficient documentation

## 2017-04-11 DIAGNOSIS — L299 Pruritus, unspecified: Secondary | ICD-10-CM

## 2017-04-11 DIAGNOSIS — N898 Other specified noninflammatory disorders of vagina: Secondary | ICD-10-CM

## 2017-04-11 DIAGNOSIS — O99712 Diseases of the skin and subcutaneous tissue complicating pregnancy, second trimester: Secondary | ICD-10-CM

## 2017-04-11 DIAGNOSIS — M069 Rheumatoid arthritis, unspecified: Secondary | ICD-10-CM

## 2017-04-11 DIAGNOSIS — O99891 Other specified diseases and conditions complicating pregnancy: Secondary | ICD-10-CM

## 2017-04-11 DIAGNOSIS — O9989 Other specified diseases and conditions complicating pregnancy, childbirth and the puerperium: Secondary | ICD-10-CM

## 2017-04-11 DIAGNOSIS — O0992 Supervision of high risk pregnancy, unspecified, second trimester: Secondary | ICD-10-CM

## 2017-04-11 DIAGNOSIS — O99280 Endocrine, nutritional and metabolic diseases complicating pregnancy, unspecified trimester: Secondary | ICD-10-CM

## 2017-04-11 DIAGNOSIS — E039 Hypothyroidism, unspecified: Secondary | ICD-10-CM

## 2017-04-11 DIAGNOSIS — O99282 Endocrine, nutritional and metabolic diseases complicating pregnancy, second trimester: Secondary | ICD-10-CM

## 2017-04-11 DIAGNOSIS — O30042 Twin pregnancy, dichorionic/diamniotic, second trimester: Secondary | ICD-10-CM

## 2017-04-11 DIAGNOSIS — O0990 Supervision of high risk pregnancy, unspecified, unspecified trimester: Secondary | ICD-10-CM

## 2017-04-11 LAB — POCT URINALYSIS DIP (DEVICE)
Bilirubin Urine: NEGATIVE
GLUCOSE, UA: NEGATIVE mg/dL
HGB URINE DIPSTICK: NEGATIVE
Ketones, ur: NEGATIVE mg/dL
LEUKOCYTES UA: NEGATIVE
NITRITE: NEGATIVE
Protein, ur: NEGATIVE mg/dL
Specific Gravity, Urine: >=1.03 (ref 1.005–1.030)
UROBILINOGEN UA: 0.2 mg/dL (ref 0.0–1.0)
pH: 6 (ref 5.0–8.0)

## 2017-04-11 MED ORDER — PREDNISONE 5 MG PO TABS: ORAL_TABLET | ORAL | 2 refills | Status: DC

## 2017-04-11 NOTE — Progress Notes (Signed)
   PRENATAL VISIT NOTE  Subjective:  Lori Valentine is a 32 y.o. G2P1001 at [redacted]w[redacted]d being seen today for ongoing prenatal care.  She is currently monitored for the following issues for this high-risk pregnancy and has Hypothyroidism affecting pregnancy; Rheumatoid arthritis (HCC); History of anxiety and depression; Smoker; Vaginal ulcer; Supervision of high-risk pregnancy; Twin gestation, dichorionic diamniotic; Obesity in pregnancy, antepartum; and Maternal rheumatoid arthritis complicating pregnancy (HCC) on their problem list.  Patient reports increasing joing pain over past 10 days. Similar to pain in the past. Started taper, but switched to 5mg  daily. .  Contractions: Not present. Vag. Bleeding: None.  Movement: Present. Denies leaking of fluid.   The following portions of the patient's history were reviewed and updated as appropriate: allergies, current medications, past family history, past medical history, past social history, past surgical history and problem list. Problem list updated.  Objective:   Vitals:   04/11/17 1632  BP: 113/68  Pulse: 96  Weight: 274 lb (124.3 kg)    Fetal Status: Fetal Heart Rate (bpm): 150/152   Movement: Present     General:  Alert, oriented and cooperative. Patient is in no acute distress.  Skin: Skin is warm and dry. No rash noted.   Cardiovascular: Normal heart rate noted  Respiratory: Normal respiratory effort, no problems with respiration noted  Abdomen: Soft, gravid, appropriate for gestational age.  Pain/Pressure: Present     Pelvic: Cervical exam deferred        Extremities: Normal range of motion.  Edema: Mild pitting, slight indentation  Mental Status:  Normal mood and affect. Normal behavior. Normal judgment and thought content.   Assessment and Plan:  Pregnancy: G2P1001 at [redacted]w[redacted]d  1. Supervision of high risk pregnancy, antepartum - GC/Chlamydia probe amp ([place])not at [facility] - Cervicovaginal ancillary only  2. Dichorionic  diamniotic twin pregnancy, antepartum Growth [facility] in 4 weeks. - GC/Chlamydia probe amp ([place])not at [facility] - Cervicovaginal ancillary only  3. Hypothyroidism affecting pregnancy, antepartum Check TSH next week when she gets blood work with rheumatologist - GC/Chlamydia probe amp ([place])not at [facility] - Cervicovaginal ancillary only  4. Maternal rheumatoid arthritis complicating pregnancy (HCC) Pt to see rheumatologist on Monday. Prednisone taper until then. - predniSONE (DELTASONE) 5 MG tablet; Take 4 tablets x 3 days, 2 tablets x 3 days, 1 tablets x 3 days.  Dispense: 21 tablet; Refill: 2  5. Vaginal discharge Self swab - GC/Chlamydia probe amp ( [place])not at [facility] - Cervicovaginal ancillary only  6. Pruritus of pregnancy in second trimester  - predniSONE (DELTASONE) 5 MG tablet; Take 4 tablets x 3 days, 2 tablets x 3 days, 1 tablets x 3 days.  Dispense: 21 tablet; Refill: 2  Preterm labor symptoms and general obstetric precautions including but not limited to vaginal bleeding, contractions, leaking of fluid and fetal movement were reviewed in detail with the patient. Please refer to After Visit Summary for other counseling recommendations.  Return in about 4 weeks (around 05/09/2017) for HR OB f/u.   07/07/2017, DO

## 2017-04-11 NOTE — Progress Notes (Signed)
Having a lot of RA pain in her knees, ankles, right arm.

## 2017-04-13 LAB — CERVICOVAGINAL ANCILLARY ONLY
BACTERIAL VAGINITIS: NEGATIVE
Candida vaginitis: POSITIVE — AB
Trichomonas: NEGATIVE

## 2017-04-13 LAB — GC/CHLAMYDIA PROBE AMP (~~LOC~~) NOT AT ARMC
Chlamydia: NEGATIVE
NEISSERIA GONORRHEA: NEGATIVE

## 2017-04-14 ENCOUNTER — Encounter: Payer: Self-pay | Admitting: Family Medicine

## 2017-04-14 ENCOUNTER — Other Ambulatory Visit: Payer: Self-pay | Admitting: Family Medicine

## 2017-04-14 MED ORDER — MICONAZOLE NITRATE 2 % VA CREA: 1.0000 | TOPICAL_CREAM | Freq: Every day | VAGINAL | 2 refills | Status: DC

## 2017-04-18 ENCOUNTER — Encounter: Payer: Self-pay | Admitting: Rheumatology

## 2017-04-18 ENCOUNTER — Ambulatory Visit (INDEPENDENT_AMBULATORY_CARE_PROVIDER_SITE_OTHER): Payer: 59 | Admitting: Rheumatology

## 2017-04-18 VITALS — BP 116/72 | HR 84 | Resp 20 | Ht 67.0 in | Wt 272.0 lb

## 2017-04-18 DIAGNOSIS — Z8659 Personal history of other mental and behavioral disorders: Secondary | ICD-10-CM | POA: Diagnosis not present

## 2017-04-18 DIAGNOSIS — Z87891 Personal history of nicotine dependence: Secondary | ICD-10-CM

## 2017-04-18 DIAGNOSIS — Z8639 Personal history of other endocrine, nutritional and metabolic disease: Secondary | ICD-10-CM

## 2017-04-18 DIAGNOSIS — F411 Generalized anxiety disorder: Secondary | ICD-10-CM | POA: Diagnosis not present

## 2017-04-18 DIAGNOSIS — R7989 Other specified abnormal findings of blood chemistry: Secondary | ICD-10-CM | POA: Diagnosis not present

## 2017-04-18 DIAGNOSIS — Z3490 Encounter for supervision of normal pregnancy, unspecified, unspecified trimester: Secondary | ICD-10-CM

## 2017-04-18 DIAGNOSIS — Z79899 Other long term (current) drug therapy: Secondary | ICD-10-CM | POA: Diagnosis not present

## 2017-04-18 DIAGNOSIS — M0579 Rheumatoid arthritis with rheumatoid factor of multiple sites without organ or systems involvement: Secondary | ICD-10-CM

## 2017-04-18 MED ORDER — HYDROXYCHLOROQUINE SULFATE 200 MG PO TABS: 200.0000 mg | ORAL_TABLET | Freq: Two times a day (BID) | ORAL | 2 refills | Status: DC

## 2017-04-19 LAB — CBC WITH DIFFERENTIAL/PLATELET
BASOS PCT: 0.2 %
Basophils Absolute: 17 cells/uL (ref 0–200)
Eosinophils Absolute: 133 cells/uL (ref 15–500)
Eosinophils Relative: 1.6 %
HCT: 32.5 % — ABNORMAL LOW (ref 35.0–45.0)
Hemoglobin: 11.2 g/dL — ABNORMAL LOW (ref 11.7–15.5)
Lymphs Abs: 1253 cells/uL (ref 850–3900)
MCH: 29.3 pg (ref 27.0–33.0)
MCHC: 34.5 g/dL (ref 32.0–36.0)
MCV: 85.1 fL (ref 80.0–100.0)
MONOS PCT: 4.6 %
MPV: 9.4 fL (ref 7.5–12.5)
Neutro Abs: 6516 cells/uL (ref 1500–7800)
Neutrophils Relative %: 78.5 %
PLATELETS: 273 10*3/uL (ref 140–400)
RBC: 3.82 10*6/uL (ref 3.80–5.10)
RDW: 12.9 % (ref 11.0–15.0)
TOTAL LYMPHOCYTE: 15.1 %
WBC: 8.3 10*3/uL (ref 3.8–10.8)
WBCMIX: 382 {cells}/uL (ref 200–950)

## 2017-04-19 LAB — COMPLETE METABOLIC PANEL WITH GFR
AG Ratio: 1.3 (calc) (ref 1.0–2.5)
ALT: 13 U/L (ref 6–29)
AST: 11 U/L (ref 10–30)
Albumin: 3.5 g/dL — ABNORMAL LOW (ref 3.6–5.1)
Alkaline phosphatase (APISO): 84 U/L (ref 33–115)
BUN/Creatinine Ratio: 16 (calc) (ref 6–22)
BUN: 7 mg/dL (ref 7–25)
CO2: 24 mmol/L (ref 20–32)
Calcium: 8.5 mg/dL — ABNORMAL LOW (ref 8.6–10.2)
Chloride: 104 mmol/L (ref 98–110)
Creat: 0.45 mg/dL — ABNORMAL LOW (ref 0.50–1.10)
GFR, EST AFRICAN AMERICAN: 156 mL/min/{1.73_m2} (ref >=60)
GFR, EST NON AFRICAN AMERICAN: 134 mL/min/{1.73_m2} (ref >=60)
GLUCOSE: 79 mg/dL (ref 65–99)
Globulin: 2.8 g/dL (calc) (ref 1.9–3.7)
Potassium: 3.7 mmol/L (ref 3.5–5.3)
Sodium: 136 mmol/L (ref 135–146)
TOTAL PROTEIN: 6.3 g/dL (ref 6.1–8.1)
Total Bilirubin: 0.3 mg/dL (ref 0.2–1.2)

## 2017-04-19 LAB — TSH: TSH: 5.14 mIU/L — ABNORMAL HIGH

## 2017-04-19 LAB — GLUCOSE 6 PHOSPHATE DEHYDROGENASE: G-6PDH: 18.4 U/g{Hb} (ref 7.0–20.5)

## 2017-04-19 NOTE — Progress Notes (Signed)
Please notify patient of lab results and fax labs to OB-Gyn.   Her TSH remains elevated. She is on Synthroid.  She is slightly anemic.   All other labs are stable.

## 2017-04-20 ENCOUNTER — Encounter: Payer: Self-pay | Admitting: Family Medicine

## 2017-04-25 ENCOUNTER — Other Ambulatory Visit: Payer: Self-pay | Admitting: Family Medicine

## 2017-04-25 MED ORDER — LEVOTHYROXINE SODIUM 150 MCG PO TABS: ORAL_TABLET | ORAL | 3 refills | Status: DC

## 2017-05-01 ENCOUNTER — Encounter: Payer: Self-pay | Admitting: Obstetrics & Gynecology

## 2017-05-05 ENCOUNTER — Other Ambulatory Visit (HOSPITAL_COMMUNITY): Payer: Self-pay | Admitting: Obstetrics and Gynecology

## 2017-05-05 ENCOUNTER — Ambulatory Visit (HOSPITAL_COMMUNITY)
Admission: RE | Admit: 2017-05-05 | Discharge: 2017-05-05 | Disposition: A | Discharge status: Home / Self Care | Payer: Managed Care, Other (non HMO) | Source: Ambulatory Visit | Attending: Obstetrics & Gynecology | Admitting: Obstetrics & Gynecology

## 2017-05-05 ENCOUNTER — Encounter (HOSPITAL_COMMUNITY): Payer: Self-pay

## 2017-05-05 DIAGNOSIS — O99210 Obesity complicating pregnancy, unspecified trimester: Secondary | ICD-10-CM

## 2017-05-05 DIAGNOSIS — E039 Hypothyroidism, unspecified: Secondary | ICD-10-CM | POA: Diagnosis not present

## 2017-05-05 DIAGNOSIS — O30042 Twin pregnancy, dichorionic/diamniotic, second trimester: Secondary | ICD-10-CM

## 2017-05-05 DIAGNOSIS — Z3A23 23 weeks gestation of pregnancy: Secondary | ICD-10-CM | POA: Insufficient documentation

## 2017-05-05 DIAGNOSIS — O99212 Obesity complicating pregnancy, second trimester: Secondary | ICD-10-CM | POA: Diagnosis not present

## 2017-05-05 DIAGNOSIS — O99282 Endocrine, nutritional and metabolic diseases complicating pregnancy, second trimester: Secondary | ICD-10-CM | POA: Insufficient documentation

## 2017-05-05 DIAGNOSIS — Z362 Encounter for other antenatal screening follow-up: Secondary | ICD-10-CM | POA: Insufficient documentation

## 2017-05-06 ENCOUNTER — Other Ambulatory Visit (HOSPITAL_COMMUNITY): Payer: Self-pay | Admitting: *Deleted

## 2017-05-06 DIAGNOSIS — O30042 Twin pregnancy, dichorionic/diamniotic, second trimester: Secondary | ICD-10-CM

## 2017-05-09 ENCOUNTER — Other Ambulatory Visit: Payer: Self-pay | Admitting: Obstetrics & Gynecology

## 2017-05-09 DIAGNOSIS — O99712 Diseases of the skin and subcutaneous tissue complicating pregnancy, second trimester: Principal | ICD-10-CM

## 2017-05-09 DIAGNOSIS — L299 Pruritus, unspecified: Secondary | ICD-10-CM

## 2017-05-09 MED ORDER — HYDROXYZINE HCL 25 MG PO TABS: 25.0000 mg | ORAL_TABLET | Freq: Four times a day (QID) | ORAL | 2 refills | Status: DC | PRN

## 2017-05-16 ENCOUNTER — Telehealth: Payer: Self-pay | Admitting: Rheumatology

## 2017-05-16 NOTE — Telephone Encounter (Signed)
Called pts insurance to get copy of approval letter for maintenance dose. Faxed document to simplicity program. Called pt to update. Left message.  Fergus Throne, [place], CPhT 11:58 AM

## 2017-05-16 NOTE — Telephone Encounter (Signed)
Patient called stating that she was trying to apply for the [employer] / [employer] assistance program and she was just informed that it was rejected due to needing a prior authorization from her doctor's office.  Patient stated that the prior authorization needs to be faxed to #[phone].

## 2017-05-17 ENCOUNTER — Ambulatory Visit (INDEPENDENT_AMBULATORY_CARE_PROVIDER_SITE_OTHER): Payer: Managed Care, Other (non HMO) | Admitting: Family Medicine

## 2017-05-17 ENCOUNTER — Encounter: Payer: Self-pay | Admitting: Rheumatology

## 2017-05-17 VITALS — BP 110/60 | HR 76 | Wt 289.0 lb

## 2017-05-17 DIAGNOSIS — O99280 Endocrine, nutritional and metabolic diseases complicating pregnancy, unspecified trimester: Secondary | ICD-10-CM

## 2017-05-17 DIAGNOSIS — O99210 Obesity complicating pregnancy, unspecified trimester: Secondary | ICD-10-CM

## 2017-05-17 DIAGNOSIS — M069 Rheumatoid arthritis, unspecified: Secondary | ICD-10-CM

## 2017-05-17 DIAGNOSIS — O9989 Other specified diseases and conditions complicating pregnancy, childbirth and the puerperium: Secondary | ICD-10-CM

## 2017-05-17 DIAGNOSIS — O99891 Other specified diseases and conditions complicating pregnancy: Secondary | ICD-10-CM

## 2017-05-17 DIAGNOSIS — E039 Hypothyroidism, unspecified: Secondary | ICD-10-CM

## 2017-05-17 DIAGNOSIS — O30049 Twin pregnancy, dichorionic/diamniotic, unspecified trimester: Secondary | ICD-10-CM

## 2017-05-17 DIAGNOSIS — O0990 Supervision of high risk pregnancy, unspecified, unspecified trimester: Secondary | ICD-10-CM

## 2017-05-17 NOTE — Progress Notes (Signed)
   PRENATAL VISIT NOTE  Subjective:  Lori Valentine is a 32 y.o. G2P1001 at [redacted]w[redacted]d being seen today for ongoing prenatal care.  She is currently monitored for the following issues for this high-risk pregnancy and has Hypothyroidism affecting pregnancy; Rheumatoid arthritis (HCC); History of anxiety and depression; Smoker; Vaginal ulcer; Supervision of high-risk pregnancy; Twin gestation, dichorionic diamniotic; Obesity in pregnancy, antepartum; and Maternal rheumatoid arthritis complicating pregnancy (HCC) on their problem list.  Patient reports some occasional pain for which she has tried a pregnancy support belts, but has not found one that works well for her. No other concerns..  Contractions: Not present. Vag. Bleeding: None.  Movement: Present. Denies leaking of fluid.   The following portions of the patient's history were reviewed and updated as appropriate: allergies, current medications, past family history, past medical history, past social history, past surgical history and problem list. Problem list updated.  Objective:   Vitals:   05/17/17 1612  BP: 110/60  Pulse: 76  Weight: 289 lb (131.1 kg)    Fetal Status: Fetal Heart Rate (bpm): 152/158   Movement: Present     General:  Alert, oriented and cooperative. Patient is in no acute distress.  Skin: Skin is warm and dry. No rash noted.   Cardiovascular: Normal heart rate noted  Respiratory: Normal respiratory effort, no problems with respiration noted  Abdomen: Soft, gravid, appropriate for gestational age.  Pain/Pressure: Present     Pelvic: Cervical exam deferred        Extremities: Normal range of motion.  Edema: Trace  Mental Status:  Normal mood and affect. Normal behavior. Normal judgment and thought content.   Assessment and Plan:  Pregnancy: G2P1001 at [redacted]w[redacted]d  1. Supervision of high risk pregnancy, antepartum Doing well. Follow up at 28 weeks for labs, 2-hr GTT  2. Obesity in pregnancy, antepartum  3. Dichorionic  diamniotic twin pregnancy, antepartum Growth [place] on 2/28  4. Hypothyroidism affecting pregnancy, antepartum TSH was elevated at 5.14 on 1/14 and Synthroid dose increased on 1/21 to Synthroid 150 mcg  - Repeat TSH 4 weeks since increase in dose. TSH ordered to be drawn next week.   5. Maternal rheumatoid arthritis complicating pregnancy (HCC) Seeing rheumatologist. On Cimzia and Plaquenil  Preterm labor symptoms and general obstetric precautions including but not limited to vaginal bleeding, contractions, leaking of fluid and fetal movement were reviewed in detail with the patient. Please refer to After Visit Summary for other counseling recommendations.  Return in about 4 weeks (around 06/14/2017).   Frederik Pear, MD  Future Appointments  Date Time Provider Department Center  06/02/2017  3:30 PM WH-MFC [place] 1 WH-MFCUS MFC-US  06/15/2017  8:55 AM Adam Phenix, MD WOC-WOCA WOC  06/15/2017  9:30 AM WOC-WOCA LAB WOC-W[facility]A WOC

## 2017-05-19 ENCOUNTER — Telehealth: Payer: Self-pay | Admitting: Rheumatology

## 2017-05-19 NOTE — Telephone Encounter (Signed)
I LMOM for patient to call, and schedule next rov due in March 2019 with [name].

## 2017-05-30 ENCOUNTER — Encounter (INDEPENDENT_AMBULATORY_CARE_PROVIDER_SITE_OTHER): Payer: Self-pay

## 2017-06-01 NOTE — Progress Notes (Signed)
Office Visit Note  Patient: Lori Valentine             Date of Birth: 12/31/1985           MRN: [id number]             PCP: Berton Bon, MD Referring: Berton Bon, MD Visit Date: 06/15/2017 Occupation: @GUAROCC @    Subjective:  Hand pain    History of Present Illness: Lori Valentine is a 32 y.o. female with seropositive rheumatoid arthritis.  Patient states that she never started the Plaquenil.  She states she was worried about the side effects and the immunosuppression.  She continues to take Cimzia her next injection is in 2 weeks.  She states she is off prednisone.  She reports some swelling and discomfort in her bilateral hands.  She denies any other joint pain or joint swelling.  She states she has edema in bilateral extremities. She has been more uncomfortable due to her weight gain with her pregnancy.  She is 7 months along.  She had an appointment with her OB today and had her glucose tolerance testing performed.  She reports her blood pressure has been very well controlled.      Activities of Daily Living:  Patient reports morning stiffness for 5  minutes.   Patient Denies nocturnal pain.  Difficulty dressing/grooming: Denies Difficulty climbing stairs: Reports Difficulty getting out of chair: Reports Difficulty using hands for taps, buttons, cutlery, and/or writing: Denies   Review of Systems  Constitutional: Positive for fatigue. Negative for weakness.  HENT: Positive for mouth dryness. Negative for mouth sores, trouble swallowing, trouble swallowing and nose dryness.   Eyes: Negative for pain, redness, visual disturbance and dryness.  Respiratory: Negative for cough, hemoptysis, shortness of breath and difficulty breathing.   Cardiovascular: Positive for swelling in legs/feet. Negative for chest pain, palpitations, hypertension and irregular heartbeat.  Gastrointestinal: Negative for blood in stool, constipation and diarrhea.  Endocrine: Positive for  increased urination.  Genitourinary: Negative for painful urination.  Musculoskeletal: Positive for arthralgias, joint pain, joint swelling and morning stiffness. Negative for myalgias, muscle weakness, muscle tenderness and myalgias.  Skin: Negative for color change, pallor, rash, hair loss, nodules/bumps, redness, skin tightness, ulcers and sensitivity to sunlight.  Allergic/Immunologic: Negative for susceptible to infections.  Neurological: Positive for headaches. Negative for dizziness and numbness.  Hematological: Negative for swollen glands.  Psychiatric/Behavioral: Negative for depressed mood and sleep disturbance. The patient is nervous/anxious.     PMFS History:  Patient Active Problem List   Diagnosis Date Noted  . Obesity in pregnancy, antepartum 03/11/2017  . Maternal rheumatoid arthritis complicating pregnancy (HCC) 03/11/2017  . Supervision of high-risk pregnancy 02/08/2017  . 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 History of anxiety and depression 10/22/2016  . Smoker 10/22/2016  . Rheumatoid arthritis (HCC) 05/13/2015  . Hypothyroidism affecting pregnancy 07/29/2014    Past Medical History:  Diagnosis Date  . Hypothyroid   . Rheumatoid arthritis (HCC)     Family History  Problem Relation Age of Onset  . Schizophrenia Mother   . Bipolar disorder Mother   . Diabetes Other   . Heart disease Other    History reviewed. No pertinent surgical history. Social History   Social History Narrative   56 year old son   Lives with  Sister and nephew and son.   Works as Sales executive at [employer]     Objective: Vital Signs: BP 113/72 (BP Location: Left Arm, Patient Position:  Sitting, Cuff Size: Large)   Pulse (!) 111   Resp 14   Wt (!) 303 lb (137.4 kg)   LMP 11/17/2016 (Approximate)   BMI 47.46 kg/m    Physical Exam  Constitutional: She is oriented to person, place, and time. She appears well-developed and well-nourished.  HENT:  Head:  Normocephalic and atraumatic.  Eyes: Conjunctivae and EOM are normal.  Neck: Normal range of motion.  Cardiovascular: Normal rate, regular rhythm, normal heart sounds and intact distal pulses.  Pulmonary/Chest: Effort normal and breath sounds normal.  Abdominal: Soft. Bowel sounds are normal.  Lymphadenopathy:    She has no cervical adenopathy.  Neurological: She is alert and oriented to person, place, and time.  Skin: Skin is warm and dry. Capillary refill takes less than 2 seconds.  Psychiatric: She has a normal mood and affect. Her behavior is normal.  Nursing note and vitals reviewed.    Musculoskeletal Exam: C-spine, thoracic spine, lumbar spine good range of motion.  No midline spinal tenderness.  No SI joint tenderness.  Shoulder joints, elbow joints, MCPs, PIPs, DIPs good range of motion.  Limited wrist ROM.  She has extensor tenosynovitis of right 5th. Hip joints, knee joints, ankle joints, MTPs, PIPs, DIPs good range of motion. No warmth or effusion of knees.  No knee crepitus.  She has pedal edema bilaterally. Mild tenderness of trochanteric bursa.   CDAI Exam: CDAI Homunculus Exam:   Joint Counts:  CDAI Tender Joint count: 0 CDAI Swollen Joint count: 0  Global Assessments:  Patient Global Assessment: 2 Provider Global Assessment: 2  CDAI Calculated Score: 4    Investigation: No additional findings. CBC Latest Ref Rng & Units 04/18/2017 02/08/2017 10/27/2016  WBC 3.8 - 10.8 Thousand/uL 8.3 9.6 5.1  Hemoglobin 11.7 - 15.5 g/dL 11.2(L) 13.0 13.2  Hematocrit 35.0 - 45.0 % 32.5(L) 37.5 38.0  Platelets 140 - 400 Thousand/uL 273 272 296   CMP Latest Ref Rng & Units 04/18/2017 03/11/2017 12/02/2016  Glucose 65 - 99 mg/dL 79 79 960(A)  BUN 7 - 25 mg/dL 7 6 18   Creatinine 0.50 - 1.10 mg/dL 5.40(J) 8.11(B) 1.47  Sodium 135 - 146 mmol/L 136 137 136  Potassium 3.5 - 5.3 mmol/L 3.7 3.9 4.4  Chloride 98 - 110 mmol/L 104 105 105  CO2 20 - 32 mmol/L 24 21 20   Calcium 8.6 - 10.2  mg/dL 8.2(N) 5.6(O) 8.9  Total Protein 6.1 - 8.1 g/dL 6.3 1.3(Y) 7.2  Total Bilirubin 0.2 - 1.2 mg/dL 0.3 <8.6 0.3  Alkaline Phos 39 - 117 IU/L - 53 90  AST 10 - 30 U/L 11 15 19   ALT 6 - 29 U/L 13 17 22     Imaging: [place] Mfm Ob Follow Up  Result Date: 06/02/2017 ----------------------------------------------------------------------  OBSTETRICS REPORT                      (Signed Final 06/02/2017 08:21 pm) ---------------------------------------------------------------------- Patient Info  ID #:       [id number]                          D.O.B.:  Sep 25, 1985 (31 yrs)  Name:       Lori Valentine                    Visit Date: 06/02/2017 04:01 pm ---------------------------------------------------------------------- Performed By  Performed By:     Vivien Rota  Ref. Address:     [address] [place],                                                             [place] [address]  Attending:        Particia Nearing MD       Location:         [facility]  Referred By:      Marny Lowenstein                    PA ---------------------------------------------------------------------- Orders   #  Description                                 Code   1  [place] MFM OB FOLLOW UP                         [phone]   2  [place] MFM OB FOLLOW UP ADDL GEST               81191.47  ----------------------------------------------------------------------   #  Ordered By               Order #        Accession #    Episode #   1  Particia Nearing            [id number]      [id number]     [id number]   2  MARTHA DECKER            [id number]      [id number]     [id number]  ---------------------------------------------------------------------- Indications   [redacted] weeks gestation of pregnancy                Z3A.27   Encounter for other antenatal screening        Z36.2   follow-up   Twin pregnancy, di/di, second trimester        O30.042   Hypothyroid (Synthroid)                         O99.280 E03.9   Obesity complicating pregnancy, second         O99.212   trimester (Prepreg BMI 36.3)  ---------------------------------------------------------------------- OB History  Blood Type:            Height:  5'8"   Weight (lb):  255       BMI:  38.77  Gravidity:    2         Term:   1  Living:       1 ---------------------------------------------------------------------- Fetal Evaluation (Fetus A)  Num Of Fetuses:     2  Fetal Heart  146  Rate(bpm):  Cardiac Activity:   Observed  Fetal Lie:          Maternal right side  Presentation:       Cephalic  Placenta:           Posterior, above cervical os  P. Cord Insertion:  Previously Visualized  Amniotic Fluid  AFI FV:      Subjectively within normal limits                              Largest Pocket(cm)                              5.73 ---------------------------------------------------------------------- Biometry (Fetus A)  BPD:      67.9  mm     G. Age:  27w 3d         45  %    CI:        72.42   %    70 - 86                                                          FL/HC:      20.0   %    18.6 - 20.4  HC:      253.8  mm     G. Age:  27w 4d         36  %    HC/AC:      1.13        1.05 - 1.21  AC:      225.4  mm     G. Age:  27w 0d         36  %    FL/BPD:     74.7   %    71 - 87  FL:       50.7  mm     G. Age:  27w 1d         37  %    FL/AC:      22.5   %    20 - 24  HUM:      46.1  mm     G. Age:  27w 1d         48  %  Est. FW:    1028  gm      2 lb 4 oz     52  %     FW Discordancy        25  % ---------------------------------------------------------------------- Gestational Age (Fetus A)  LMP:           28w 1d        Date:  11/17/16                 EDD:   08/24/17  U/S Today:     27w 2d                                        EDD:   08/30/17  Best:          27w 1d     Det. By:  Early Ultrasound         EDD:   08/31/17                                      (01/21/17) ----------------------------------------------------------------------  Anatomy (Fetus A)  Cranium:               Previously seen        Aortic Arch:            Previously seen  Cavum:                 Previously seen        Ductal Arch:            Previously seen  Ventricles:            Previously seen        Diaphragm:              Previously seen  Choroid Plexus:        Previously seen        Stomach:                Appears normal, left                                                                        sided  Cerebellum:            Previously seen        Abdomen:                Appears normal  Posterior Fossa:       Previously seen        Abdominal Wall:         Previously seen  Nuchal Fold:           Previously seen        Cord Vessels:           Previously seen  Face:                  Orbits and profile     Kidneys:                Previously seen                         previously seen  Lips:                  Not well visualized    Bladder:                Appears normal  Thoracic:              Appears normal         Spine:                  Previously seen  Heart:                 Previously seen        Upper Extremities:      Previously seen  RVOT:  Not well visualized    Lower Extremities:      Previously seen  LVOT:                  Previously seen  Other:  Fetus appears to be a female. Technically difficult due to fetal position. ---------------------------------------------------------------------- Fetal Evaluation (Fetus B)  Num Of Fetuses:     2  Fetal Heart         153  Rate(bpm):  Cardiac Activity:   Observed  Fetal Lie:          Maternal left side  Presentation:       Cephalic  Placenta:           Anterior, above cervical os  P. Cord Insertion:  Previously Visualized  Amniotic Fluid  AFI FV:      Subjectively within normal limits                              Largest Pocket(cm)                              5.07 ---------------------------------------------------------------------- Biometry (Fetus B)  BPD:      70.7  mm     G. Age:  28w 3d         79  %     CI:        72.55   %    70 - 86                                                          FL/HC:      20.8   %    18.6 - 20.4  HC:       264   mm     G. Age:  28w 5d         74  %    HC/AC:      1.03        1.05 - 1.21  AC:       256   mm     G. Age:  29w 6d       > 97  %    FL/BPD:     77.5   %    71 - 87  FL:       54.8  mm     G. Age:  28w 6d         84  %    FL/AC:      21.4   %    20 - 24  HUM:      51.5  mm     G. Age:  30w 1d       > 95  %  Est. FW:    1369  gm           3 lb     84  %     FW Discordancy     0 \ 25 % ---------------------------------------------------------------------- Gestational Age (Fetus B)  LMP:           28w 1d        Date:  11/17/16  EDD:   08/24/17  U/S Today:     29w 0d                                        EDD:   08/18/17  Best:          27w 1d     Det. ByMarcella Dubs         EDD:   08/31/17                                      (01/21/17) ---------------------------------------------------------------------- Anatomy (Fetus B)  Cranium:               Appears normal         Aortic Arch:            Previously seen  Cavum:                 Previously seen        Ductal Arch:            Previously seen  Ventricles:            Previously seen        Diaphragm:              Previously seen  Choroid Plexus:        Previously seen        Stomach:                Appears normal, left                                                                        sided  Cerebellum:            Previously seen        Abdomen:                Appears normal  Posterior Fossa:       Previously seen        Abdominal Wall:         Previously seen  Nuchal Fold:           Previously seen        Cord Vessels:           Previously seen  Face:                  Orbits and profile     Kidneys:                Appear normal                         previously seen  Lips:                  Previously seen        Bladder:                Appears normal  Thoracic:  Appears normal          Spine:                  Previously seen  Heart:                 Previously seen        Upper Extremities:      Previously seen  RVOT:                  Previously seen        Lower Extremities:      Previously seen  LVOT:                  Previously seen  Other:  Fetus appears to be a female. Heels prev. visualized. Right 5th prev.          visualized. ---------------------------------------------------------------------- Cervix Uterus Adnexa  Cervix  Length:              4  cm.  Normal appearance by transabdominal scan.  Uterus  No abnormality visualized.  Left Ovary  Not visualized.  Right Ovary  Not visualized.  Adnexa:       No abnormality visualized. No adnexal mass                visualized. ---------------------------------------------------------------------- Impression  Dichorionic/diamniotic twin pregnancy at 27+1 weeks with  cardiac activity x 2  Normal interval anatomy x 2; anatomic survey complete  except for Twin A's upper lip and RVOT  Normal amniotic fluid volume x 2  Appropriate interval growths with EFWs at the 52nd and 84th  %tiles ---------------------------------------------------------------------- Recommendations  Follow-up ultrasound for growth in 4 weeks ----------------------------------------------------------------------                 Particia Nearing, MD Electronically Signed Final Report   06/02/2017 08:21 pm ----------------------------------------------------------------------  [place] Mfm Ob Follow Up Addl Gest  Result Date: 06/02/2017 ----------------------------------------------------------------------  OBSTETRICS REPORT                      (Signed Final 06/02/2017 08:21 pm) ---------------------------------------------------------------------- Patient Info  ID #:       [id number]                          D.O.B.:  08-10-1985 (31 yrs)  Name:       Lori Valentine                    Visit Date: 06/02/2017 04:01 pm ----------------------------------------------------------------------  Performed By  Performed By:     Vivien Rota        Ref. Address:     [address] [place],  [place] [address]  Attending:        Particia Nearing MD       Location:         [facility]  Referred By:      Marny Lowenstein                    PA ---------------------------------------------------------------------- Orders   #  Description                                 Code   1  [place] MFM OB FOLLOW UP                         [phone]   2  [place] MFM OB FOLLOW UP ADDL GEST               40347.42  ----------------------------------------------------------------------   #  Ordered By               Order #        Accession #    Episode #   1  Particia Nearing            [id number]      [id number]     [id number]   2  MARTHA DECKER            [id number]      [id number]     [id number]  ---------------------------------------------------------------------- Indications   [redacted] weeks gestation of pregnancy                Z3A.27   Encounter for other antenatal screening        Z36.2   follow-up   Twin pregnancy, di/di, second trimester        O30.042   Hypothyroid (Synthroid)                        O99.280 E03.9   Obesity complicating pregnancy, second         O99.212   trimester (Prepreg BMI 36.3)  ---------------------------------------------------------------------- OB History  Blood Type:            Height:  5'8"   Weight (lb):  255       BMI:  38.77  Gravidity:    2         Term:   1  Living:       1 ---------------------------------------------------------------------- Fetal Evaluation (Fetus A)  Num Of Fetuses:     2  Fetal Heart         146  Rate(bpm):  Cardiac Activity:   Observed  Fetal Lie:          Maternal right side  Presentation:       Cephalic  Placenta:           Posterior, above cervical os  P. Cord Insertion:  Previously Visualized  Amniotic Fluid  AFI FV:      Subjectively  within normal limits                              Largest Pocket(cm)                              5.73 ---------------------------------------------------------------------- Biometry (Fetus A)  BPD:      67.9  mm     G. Age:  27w 3d         45  %    CI:        72.42   %    70 - 86                                                          FL/HC:      20.0   %    18.6 - 20.4  HC:      253.8  mm     G. Age:  27w 4d         36  %    HC/AC:      1.13        1.05 - 1.21  AC:      225.4  mm     G. Age:  27w 0d         36  %    FL/BPD:     74.7   %    71 - 87  FL:       50.7  mm     G. Age:  27w 1d         37  %    FL/AC:      22.5   %    20 - 24  HUM:      46.1  mm     G. Age:  27w 1d         48  %  Est. FW:    1028  gm      2 lb 4 oz     52  %     FW Discordancy        25  % ---------------------------------------------------------------------- Gestational Age (Fetus A)  LMP:           28w 1d        Date:  11/17/16                 EDD:   08/24/17  U/S Today:     27w 2d                                        EDD:   08/30/17  Best:          27w 1d     Det. By:  Marcella Dubs         EDD:   08/31/17                                      (01/21/17) ---------------------------------------------------------------------- Anatomy (Fetus A)  Cranium:               Previously seen        Aortic Arch:            Previously seen  Cavum:                 Previously seen        Ductal Arch:            Previously seen  Ventricles:  Previously seen        Diaphragm:              Previously seen  Choroid Plexus:        Previously seen        Stomach:                Appears normal, left                                                                        sided  Cerebellum:            Previously seen        Abdomen:                Appears normal  Posterior Fossa:       Previously seen        Abdominal Wall:         Previously seen  Nuchal Fold:           Previously seen        Cord Vessels:           Previously seen  Face:                   Orbits and profile     Kidneys:                Previously seen                         previously seen  Lips:                  Not well visualized    Bladder:                Appears normal  Thoracic:              Appears normal         Spine:                  Previously seen  Heart:                 Previously seen        Upper Extremities:      Previously seen  RVOT:                  Not well visualized    Lower Extremities:      Previously seen  LVOT:                  Previously seen  Other:  Fetus appears to be a female. Technically difficult due to fetal position. ---------------------------------------------------------------------- Fetal Evaluation (Fetus B)  Num Of Fetuses:     2  Fetal Heart         153  Rate(bpm):  Cardiac Activity:   Observed  Fetal Lie:          Maternal left side  Presentation:       Cephalic  Placenta:           Anterior, above cervical os  P. Cord Insertion:  Previously Visualized  Amniotic Fluid  AFI FV:      Subjectively within  normal limits                              Largest Pocket(cm)                              5.07 ---------------------------------------------------------------------- Biometry (Fetus B)  BPD:      70.7  mm     G. Age:  28w 3d         79  %    CI:        72.55   %    70 - 86                                                          FL/HC:      20.8   %    18.6 - 20.4  HC:       264   mm     G. Age:  28w 5d         74  %    HC/AC:      1.03        1.05 - 1.21  AC:       256   mm     G. Age:  29w 6d       > 97  %    FL/BPD:     77.5   %    71 - 87  FL:       54.8  mm     G. Age:  28w 6d         84  %    FL/AC:      21.4   %    20 - 24  HUM:      51.5  mm     G. Age:  30w 1d       > 95  %  Est. FW:    1369  gm           3 lb     84  %     FW Discordancy     0 \ 25 % ---------------------------------------------------------------------- Gestational Age (Fetus B)  LMP:           28w 1d        Date:  11/17/16                 EDD:   08/24/17  U/S Today:      29w 0d                                        EDD:   08/18/17  Best:          27w 1d     Det. ByMarcella Dubs         EDD:   08/31/17                                      (01/21/17) ---------------------------------------------------------------------- Anatomy (Fetus B)  Cranium:  Appears normal         Aortic Arch:            Previously seen  Cavum:                 Previously seen        Ductal Arch:            Previously seen  Ventricles:            Previously seen        Diaphragm:              Previously seen  Choroid Plexus:        Previously seen        Stomach:                Appears normal, left                                                                        sided  Cerebellum:            Previously seen        Abdomen:                Appears normal  Posterior Fossa:       Previously seen        Abdominal Wall:         Previously seen  Nuchal Fold:           Previously seen        Cord Vessels:           Previously seen  Face:                  Orbits and profile     Kidneys:                Appear normal                         previously seen  Lips:                  Previously seen        Bladder:                Appears normal  Thoracic:              Appears normal         Spine:                  Previously seen  Heart:                 Previously seen        Upper Extremities:      Previously seen  RVOT:                  Previously seen        Lower Extremities:      Previously seen  LVOT:                  Previously seen  Other:  Fetus appears to be a female. Heels prev. visualized. Right 5th prev.  visualized. ---------------------------------------------------------------------- Cervix Uterus Adnexa  Cervix  Length:              4  cm.  Normal appearance by transabdominal scan.  Uterus  No abnormality visualized.  Left Ovary  Not visualized.  Right Ovary  Not visualized.  Adnexa:       No abnormality visualized. No adnexal mass                visualized.  ---------------------------------------------------------------------- Impression  Dichorionic/diamniotic twin pregnancy at 27+1 weeks with  cardiac activity x 2  Normal interval anatomy x 2; anatomic survey complete  except for Twin A's upper lip and RVOT  Normal amniotic fluid volume x 2  Appropriate interval growths with EFWs at the 52nd and 84th  %tiles ---------------------------------------------------------------------- Recommendations  Follow-up ultrasound for growth in 4 weeks ----------------------------------------------------------------------                 Particia Nearing, MD Electronically Signed Final Report   06/02/2017 08:21 pm ----------------------------------------------------------------------   Speciality Comments: No specialty comments available.    Procedures:  No procedures performed Allergies: Effexor [venlafaxine]   Assessment / Plan:     Visit Diagnoses: Rheumatoid arthritis involving multiple sites with positive rheumatoid factor (HCC): She has extensor tenosynovitis of right 5th digit.  She reports bilateral hand and wrist pain and swelling intermittently.  She denies any other joints that are painful or inflamed.  She will continue on Cimzia.  She is due for her next injection in 2 weeks.  She decided to never start PLQ due to possible side effects and risk of immunosuppression.    High risk medication use - Cimzia. (Remicade IV 3mg / Kg and Enbrel inadequate response). TB gold 07/07/16 negative. She will return in April and every 3 months for CBC and CMP to monitor for drug toxicity.  TB gold will be drawn with next labs. Standing orders are in place.   Other medical conditions are listed as follows:   Anxiety and depression  History of hypothyroidism  Former smoker  Dichorionic diamniotic twin pregnancy, antepartum  Hypothyroidism affecting pregnancy, antepartum    Orders: No orders of the defined types were placed in this encounter.  No orders of the  defined types were placed in this encounter.    Follow-Up Instructions: No Follow-up on file.   Gearldine Bienenstock, PA-C  I examined and evaluated the patient with Sherron Ales PA. The plan of care was discussed as noted above.  Pollyann Savoy, MD Note - This record has been created using Animal nutritionist.  Chart creation errors have been sought, but may not always  have been located. Such creation errors do not reflect on  the standard of medical care.

## 2017-06-02 ENCOUNTER — Other Ambulatory Visit (HOSPITAL_COMMUNITY): Payer: Self-pay | Admitting: Maternal and Fetal Medicine

## 2017-06-02 ENCOUNTER — Encounter (HOSPITAL_COMMUNITY): Payer: Self-pay

## 2017-06-02 ENCOUNTER — Ambulatory Visit (HOSPITAL_COMMUNITY)
Admission: RE | Admit: 2017-06-02 | Discharge: 2017-06-02 | Disposition: A | Discharge status: Home / Self Care | Payer: Managed Care, Other (non HMO) | Source: Ambulatory Visit | Attending: Obstetrics & Gynecology | Admitting: Obstetrics & Gynecology

## 2017-06-02 DIAGNOSIS — O99212 Obesity complicating pregnancy, second trimester: Secondary | ICD-10-CM

## 2017-06-02 DIAGNOSIS — O30042 Twin pregnancy, dichorionic/diamniotic, second trimester: Secondary | ICD-10-CM

## 2017-06-02 DIAGNOSIS — O99210 Obesity complicating pregnancy, unspecified trimester: Secondary | ICD-10-CM

## 2017-06-02 DIAGNOSIS — O99282 Endocrine, nutritional and metabolic diseases complicating pregnancy, second trimester: Secondary | ICD-10-CM | POA: Insufficient documentation

## 2017-06-02 DIAGNOSIS — Z3A27 27 weeks gestation of pregnancy: Secondary | ICD-10-CM | POA: Diagnosis not present

## 2017-06-02 DIAGNOSIS — E039 Hypothyroidism, unspecified: Secondary | ICD-10-CM | POA: Insufficient documentation

## 2017-06-02 DIAGNOSIS — Z362 Encounter for other antenatal screening follow-up: Secondary | ICD-10-CM

## 2017-06-03 ENCOUNTER — Other Ambulatory Visit (HOSPITAL_COMMUNITY): Payer: Self-pay | Admitting: *Deleted

## 2017-06-03 DIAGNOSIS — O30043 Twin pregnancy, dichorionic/diamniotic, third trimester: Secondary | ICD-10-CM

## 2017-06-14 ENCOUNTER — Other Ambulatory Visit: Payer: Self-pay | Admitting: General Practice

## 2017-06-14 DIAGNOSIS — O0993 Supervision of high risk pregnancy, unspecified, third trimester: Secondary | ICD-10-CM

## 2017-06-15 ENCOUNTER — Ambulatory Visit (INDEPENDENT_AMBULATORY_CARE_PROVIDER_SITE_OTHER): Payer: 59 | Admitting: Physician Assistant

## 2017-06-15 ENCOUNTER — Other Ambulatory Visit: Payer: Managed Care, Other (non HMO)

## 2017-06-15 ENCOUNTER — Encounter: Payer: Self-pay | Admitting: Obstetrics & Gynecology

## 2017-06-15 ENCOUNTER — Encounter: Payer: Self-pay | Admitting: Physician Assistant

## 2017-06-15 ENCOUNTER — Ambulatory Visit (INDEPENDENT_AMBULATORY_CARE_PROVIDER_SITE_OTHER): Payer: Managed Care, Other (non HMO) | Admitting: Obstetrics & Gynecology

## 2017-06-15 ENCOUNTER — Telehealth: Payer: Self-pay

## 2017-06-15 VITALS — BP 115/65 | HR 90 | Wt 300.1 lb

## 2017-06-15 VITALS — BP 113/72 | HR 111 | Resp 14 | Wt 303.0 lb

## 2017-06-15 DIAGNOSIS — Z79899 Other long term (current) drug therapy: Secondary | ICD-10-CM

## 2017-06-15 DIAGNOSIS — F419 Anxiety disorder, unspecified: Secondary | ICD-10-CM | POA: Diagnosis not present

## 2017-06-15 DIAGNOSIS — O99283 Endocrine, nutritional and metabolic diseases complicating pregnancy, third trimester: Secondary | ICD-10-CM

## 2017-06-15 DIAGNOSIS — O30049 Twin pregnancy, dichorionic/diamniotic, unspecified trimester: Secondary | ICD-10-CM

## 2017-06-15 DIAGNOSIS — Z23 Encounter for immunization: Secondary | ICD-10-CM | POA: Diagnosis not present

## 2017-06-15 DIAGNOSIS — O30043 Twin pregnancy, dichorionic/diamniotic, third trimester: Secondary | ICD-10-CM

## 2017-06-15 DIAGNOSIS — O0993 Supervision of high risk pregnancy, unspecified, third trimester: Secondary | ICD-10-CM

## 2017-06-15 DIAGNOSIS — E039 Hypothyroidism, unspecified: Secondary | ICD-10-CM | POA: Diagnosis not present

## 2017-06-15 DIAGNOSIS — F329 Major depressive disorder, single episode, unspecified: Secondary | ICD-10-CM | POA: Diagnosis not present

## 2017-06-15 DIAGNOSIS — M0579 Rheumatoid arthritis with rheumatoid factor of multiple sites without organ or systems involvement: Secondary | ICD-10-CM | POA: Diagnosis not present

## 2017-06-15 DIAGNOSIS — O99280 Endocrine, nutritional and metabolic diseases complicating pregnancy, unspecified trimester: Secondary | ICD-10-CM

## 2017-06-15 DIAGNOSIS — F32A Depression, unspecified: Secondary | ICD-10-CM

## 2017-06-15 DIAGNOSIS — Z8639 Personal history of other endocrine, nutritional and metabolic disease: Secondary | ICD-10-CM | POA: Diagnosis not present

## 2017-06-15 DIAGNOSIS — Z87891 Personal history of nicotine dependence: Secondary | ICD-10-CM | POA: Diagnosis not present

## 2017-06-15 NOTE — Patient Instructions (Signed)
Standing Labs We placed an order today for your standing lab work.    Please come back and get your standing labs in April and every 3 months    CBC, CMP, and TB gold   We have open lab Monday through Friday from 8:30-11:30 AM and 1:30-4 PM at the office of Dr. Shaili Deveshwar.   The office is located at [address], [address], [place], [place] [address] No appointment is necessary.   Labs are drawn by [facility].  You may receive a bill from [facility] for your lab work. If you have any questions regarding directions or hours of operation,  please call [phone].    

## 2017-06-15 NOTE — Progress Notes (Signed)
   PRENATAL VISIT NOTE  Subjective:  Lori Valentine is a 32 y.o. G2P1001 at [redacted]w[redacted]d being seen today for ongoing prenatal care.  She is currently monitored for the following issues for this high-risk pregnancy and has Hypothyroidism affecting pregnancy; Rheumatoid arthritis (HCC); History of anxiety and depression; Smoker; Supervision of high-risk pregnancy; Twin gestation, dichorionic diamniotic; Obesity in pregnancy, antepartum; and Maternal rheumatoid arthritis complicating pregnancy (HCC) on their problem list.  Patient reports pedal edema.  Contractions: Not present. Vag. Bleeding: None.  Movement: Present. Denies leaking of fluid.   The following portions of the patient's history were reviewed and updated as appropriate: allergies, current medications, past family history, past medical history, past social history, past surgical history and problem list. Problem list updated.  Objective:   Vitals:   06/15/17 0856  BP: 115/65  Pulse: 90  Weight: (!) 300 lb 1.6 oz (136.1 kg)    Fetal Status: Fetal Heart Rate (bpm): 140/145   Movement: Present     General:  Alert, oriented and cooperative. Patient is in no acute distress.  Skin: Skin is warm and dry. No rash noted.   Cardiovascular: Normal heart rate noted  Respiratory: Normal respiratory effort, no problems with respiration noted  Abdomen: Soft, gravid, appropriate for gestational age.  Pain/Pressure: Present     Pelvic: Cervical exam deferred        Extremities: Normal range of motion.  Edema: Mild pitting, slight indentation  Mental Status:  Normal mood and affect. Normal behavior. Normal judgment and thought content.   Assessment and Plan:  Pregnancy: G2P1001 at [redacted]w[redacted]d  1. Need for diphtheria-tetanus-pertussis (Tdap) vaccine  - Tdap vaccine greater than or equal to 7yo IM  2. Supervision of high risk pregnancy in third trimester hypothyroid - TSH  3. Dichorionic diamniotic twin pregnancy in third trimester Concordant  growth, female/female  4. Hypothyroidism affecting pregnancy in third trimester TSH  Preterm labor symptoms and general obstetric precautions including but not limited to vaginal bleeding, contractions, leaking of fluid and fetal movement were reviewed in detail with the patient. Please refer to After Visit Summary for other counseling recommendations.  Return in about 1 week (around 06/22/2017).   Scheryl Darter, MD

## 2017-06-15 NOTE — Patient Instructions (Signed)
Multiple Pregnancy Having a multiple pregnancy means that a woman is carrying more than one baby at a time. She may be pregnant with twins, triplets, or more. The majority of multiple pregnancies are twins. Naturally conceiving triplets or more (higher-order multiples) is rare. Multiple pregnancies are riskier than single pregnancies. A woman with a multiple pregnancy is more likely to have certain problems during her pregnancy. Therefore, she will need to have more frequent appointments for prenatal care. How does a multiple pregnancy happen? A multiple pregnancy happens when:  The woman's body releases more than one egg at a time, and then each egg gets fertilized by a different sperm. ? This is the most common type of multiple pregnancy. ? Twins or other multiples produced this way are fraternal. They are no more alike than non-multiple siblings are.  One sperm fertilizes one egg, which then divides into more than one embryo. ? Twins or other multiples produced this way are identical. Identical multiples are always the same gender, and they look very much alike.  Who is most likely to have a multiple pregnancy? A multiple pregnancy is more likely to develop in women who:  Have had fertility treatment, especially if the treatment included fertility drugs.  Are older than 31 years of age.  Have already had four or more children.  Have a family history of multiple pregnancy.  How is a multiple pregnancy diagnosed? A multiple pregnancy may be diagnosed based on:  Symptoms such as: ? Rapid weight gain in the first 3 months of pregnancy (first trimester). ? More severe nausea and breast tenderness than what is typical of a single pregnancy. ? The uterus measuring larger than what is normal for the stage of the pregnancy.  Blood tests that detect a higher-than-normal level of human chorionic gonadotropin (hCG). This is a hormone that your body produces in early pregnancy.  Ultrasound  exam. This is used to confirm that you are carrying multiples.  What risks are associated with multiple pregnancy? A multiple pregnancy puts you at a higher risk for certain problems during or after your pregnancy, including:  Having your babies delivered before you have reached a full-term pregnancy (preterm birth). A full-term pregnancy lasts for at least 37 weeks. Babies born before 53 weeks may have a higher risk of a variety of health problems, such as breathing problems, feeding difficulties, cerebral palsy, and learning disabilities.  Diabetes.  Preeclampsia. This is a serious condition that causes high blood pressure along with other symptoms, such as swelling and headaches, during pregnancy.  Excessive blood loss after childbirth (postpartum hemorrhage).  Postpartum depression.  Low birth weight of the babies.  How will having a multiple pregnancy affect my care? Your health care provider will want to monitor you more closely during your pregnancy to make sure that your babies are growing normally and that you are healthy. Follow these instructions at home: Because your pregnancy is considered to be high risk, you will need to work closely with your health care team. You may also need to make some lifestyle changes. These may include the following: Eating and drinking  Increase your nutrition. ? Follow your health care provider's recommendations for weight gain. You may need to gain a little extra weight when you are pregnant with multiples. ? Eat healthy snacks often throughout the day. This can add calories and reduce nausea.  Drink enough fluid to keep your urine clear or pale yellow.  Take prenatal vitamins. Activity By 20-24 weeks, you may  need to limit your activities.  Avoid activities and work that take a lot of effort (are strenuous).  Ask your health care provider when you should stop having sexual intercourse.  Rest often.  General instructions  Do not use  any products that contain nicotine or tobacco, such as cigarettes and e-cigarettes. If you need help quitting, ask your health care provider.  Do not drink alcohol or use illegal drugs.  Take over-the-counter and prescription medicines only as told by your health care provider.  Arrange for extra help around the house.  Keep all follow-up visits and all prenatal visits as told by your health care provider. This is important. Contact a health care provider if:  You have dizziness.  You have persistent nausea, vomiting, or diarrhea.  You are having trouble gaining weight.  You have feelings of depression or other emotions that are interfering with your normal activities. Get help right away if:  You have a fever.  You have pain with urination.  You have fluid leaking from your vagina.  You have a bad-smelling vaginal discharge.  You notice increased swelling in your face, hands, legs, or ankles.  You have spotting or bleeding from your vagina.  You have pelvic cramps, pelvic pressure, or nagging pain in your abdomen or lower back.  You are having regular contractions.  You develop a severe headache, with or without visual changes.  You have shortness of breath or chest pain.  You notice less fetal movement, or no fetal movement. This information is not intended to replace advice given to you by your health care provider. Make sure you discuss any questions you have with your health care provider. Document Released: 12/30/2007 Document Revised: 11/21/2015 Document Reviewed: 11/21/2015 Elsevier Interactive Patient Education  2018 [employer]  

## 2017-06-15 NOTE — Telephone Encounter (Signed)
Spoke with patient at visit. She expresses frustration about being able to get Cimiza from [employer]. Her last refill was in December 2018 for a 3 month supply. She will be due for a refill in two weeks. She enrolled in a Cimzia copay card over the phone last year. She didn't have to use it then because her copay was $0 with her insurance (deductible met).   We called Cimpliciy co-pay card to get the pharmacy information for her to use on her refill. [phone]).  Spoke with representative who states that the pt should have received a visa debt card in the mail (mailed out by [employer] on 2/14). Pt requested a new card to be sent to her home within 2 business days.   Patient will call [facility] to schedule refill. She will give them the visa card info once received. She will contact us if she has any issues.   Antigone Crowell, [place], CPhT 2:08 PM

## 2017-06-16 ENCOUNTER — Telehealth: Payer: Self-pay | Admitting: Family Medicine

## 2017-06-16 ENCOUNTER — Encounter: Payer: Self-pay | Admitting: Obstetrics & Gynecology

## 2017-06-16 LAB — GLUCOSE TOLERANCE, 2 HOURS W/ 1HR
GLUCOSE, FASTING: 81 mg/dL (ref 65–91)
Glucose, 1 hour: 132 mg/dL (ref 65–179)
Glucose, 2 hour: 97 mg/dL (ref 65–152)

## 2017-06-16 LAB — CBC
HEMOGLOBIN: 9.5 g/dL — AB (ref 11.1–15.9)
Hematocrit: 29.4 % — ABNORMAL LOW (ref 34.0–46.6)
MCH: 26.5 pg — ABNORMAL LOW (ref 26.6–33.0)
MCHC: 32.3 g/dL (ref 31.5–35.7)
MCV: 82 fL (ref 79–97)
PLATELETS: 191 10*3/uL (ref 150–379)
RBC: 3.58 x10E6/uL — AB (ref 3.77–5.28)
RDW: 14 % (ref 12.3–15.4)
WBC: 8.5 10*3/uL (ref 3.4–10.8)

## 2017-06-16 LAB — HIV ANTIBODY (ROUTINE TESTING W REFLEX): HIV Screen 4th Generation wRfx: NONREACTIVE

## 2017-06-16 LAB — RPR: RPR Ser Ql: NONREACTIVE

## 2017-06-16 LAB — TSH: TSH: 4.55 u[IU]/mL — AB (ref 0.450–4.500)

## 2017-06-16 MED ORDER — LEVOTHYROXINE SODIUM 175 MCG PO TABS: 175.0000 ug | ORAL_TABLET | Freq: Every day | ORAL | 1 refills | Status: DC

## 2017-06-21 ENCOUNTER — Encounter: Payer: Self-pay | Admitting: *Deleted

## 2017-06-23 ENCOUNTER — Ambulatory Visit (INDEPENDENT_AMBULATORY_CARE_PROVIDER_SITE_OTHER): Payer: Managed Care, Other (non HMO) | Admitting: Family Medicine

## 2017-06-23 VITALS — BP 118/66 | HR 98 | Wt 301.3 lb

## 2017-06-23 DIAGNOSIS — E039 Hypothyroidism, unspecified: Secondary | ICD-10-CM

## 2017-06-23 DIAGNOSIS — M0579 Rheumatoid arthritis with rheumatoid factor of multiple sites without organ or systems involvement: Secondary | ICD-10-CM

## 2017-06-23 DIAGNOSIS — O99210 Obesity complicating pregnancy, unspecified trimester: Secondary | ICD-10-CM

## 2017-06-23 DIAGNOSIS — O0993 Supervision of high risk pregnancy, unspecified, third trimester: Secondary | ICD-10-CM

## 2017-06-23 DIAGNOSIS — O30043 Twin pregnancy, dichorionic/diamniotic, third trimester: Secondary | ICD-10-CM

## 2017-06-23 DIAGNOSIS — O99283 Endocrine, nutritional and metabolic diseases complicating pregnancy, third trimester: Secondary | ICD-10-CM

## 2017-06-23 MED ORDER — FLUCONAZOLE 150 MG PO TABS: 150.0000 mg | ORAL_TABLET | Freq: Once | ORAL | 0 refills | Status: AC

## 2017-06-23 MED ORDER — FERROUS GLUCONATE 324 (38 FE) MG PO TABS: 324.0000 mg | ORAL_TABLET | Freq: Every day | ORAL | 3 refills | Status: DC

## 2017-06-23 NOTE — Progress Notes (Signed)
   PRENATAL VISIT NOTE  Subjective:  Lori Valentine is a 32 y.o. G2P1001 at [redacted]w[redacted]d being seen today for ongoing prenatal care.  She is currently monitored for the following issues for this high-risk pregnancy and has Hypothyroidism affecting pregnancy; Rheumatoid arthritis (HCC); History of anxiety and depression; Smoker; Supervision of high-risk pregnancy; Twin gestation, dichorionic diamniotic; Obesity in pregnancy, antepartum; and Maternal rheumatoid arthritis complicating pregnancy (HCC) on their problem list.  Patient reports no complaints.  Contractions: Not present. Vag. Bleeding: None.  Movement: Present. Denies leaking of fluid.   The following portions of the patient's history were reviewed and updated as appropriate: allergies, current medications, past family history, past medical history, past social history, past surgical history and problem list. Problem list updated.  Objective:   Vitals:   06/23/17 1501  BP: 118/66  Pulse: 98  Weight: (!) 301 lb 4.8 oz (136.7 kg)    Fetal Status:     Movement: Present     General:  Alert, oriented and cooperative. Patient is in no acute distress.  Skin: Skin is warm and dry. No rash noted.   Cardiovascular: Normal heart rate noted  Respiratory: Normal respiratory effort, no problems with respiration noted  Abdomen: Soft, gravid, appropriate for gestational age.  Pain/Pressure: Present     Pelvic: Cervical exam deferred        Extremities: Normal range of motion.     Mental Status:  Normal mood and affect. Normal behavior. Normal judgment and thought content.   Assessment and Plan:  Pregnancy: G2P1001 at [redacted]w[redacted]d  1. Supervision of high risk pregnancy in third trimester FHT and FH normal  2. Dichorionic diamniotic twin pregnancy in third trimester FHT normal. Weight normal. F/U US on 3/28.  3. Hypothyroidism affecting pregnancy in third trimester On synthroid  4. Rheumatoid arthritis involving multiple sites with positive  rheumatoid factor (HCC) On certolizumab  5. Obesity in pregnancy, antepartum   Preterm labor symptoms and general obstetric precautions including but not limited to vaginal bleeding, contractions, leaking of fluid and fetal movement were reviewed in detail with the patient. Please refer to After Visit Summary for other counseling recommendations.  No follow-ups on file.   Levie Heritage, DO

## 2017-06-23 NOTE — Progress Notes (Signed)
Pt has sx of vaginal yeast - white discharge and vaginal irritation. She requests Rx - does not want Monistat. [place] for growth scheduled on 3/28.

## 2017-06-28 ENCOUNTER — Other Ambulatory Visit: Payer: Self-pay | Admitting: Family Medicine

## 2017-06-29 ENCOUNTER — Encounter: Payer: Managed Care, Other (non HMO) | Admitting: Obstetrics & Gynecology

## 2017-06-30 ENCOUNTER — Encounter (HOSPITAL_COMMUNITY): Payer: Self-pay

## 2017-06-30 ENCOUNTER — Other Ambulatory Visit (HOSPITAL_COMMUNITY): Payer: Self-pay | Admitting: Maternal and Fetal Medicine

## 2017-06-30 ENCOUNTER — Ambulatory Visit (HOSPITAL_COMMUNITY)
Admission: RE | Admit: 2017-06-30 | Discharge: 2017-06-30 | Disposition: A | Discharge status: Home / Self Care | Payer: Managed Care, Other (non HMO) | Source: Ambulatory Visit | Attending: Obstetrics & Gynecology | Admitting: Obstetrics & Gynecology

## 2017-06-30 DIAGNOSIS — Z362 Encounter for other antenatal screening follow-up: Secondary | ICD-10-CM | POA: Diagnosis present

## 2017-06-30 DIAGNOSIS — O2693 Pregnancy related conditions, unspecified, third trimester: Secondary | ICD-10-CM

## 2017-06-30 DIAGNOSIS — O99283 Endocrine, nutritional and metabolic diseases complicating pregnancy, third trimester: Secondary | ICD-10-CM | POA: Diagnosis not present

## 2017-06-30 DIAGNOSIS — O30043 Twin pregnancy, dichorionic/diamniotic, third trimester: Secondary | ICD-10-CM

## 2017-06-30 DIAGNOSIS — Z3A31 31 weeks gestation of pregnancy: Secondary | ICD-10-CM | POA: Diagnosis not present

## 2017-06-30 DIAGNOSIS — E039 Hypothyroidism, unspecified: Secondary | ICD-10-CM | POA: Insufficient documentation

## 2017-06-30 DIAGNOSIS — O09893 Supervision of other high risk pregnancies, third trimester: Secondary | ICD-10-CM | POA: Insufficient documentation

## 2017-06-30 DIAGNOSIS — O99213 Obesity complicating pregnancy, third trimester: Secondary | ICD-10-CM

## 2017-06-30 DIAGNOSIS — O99210 Obesity complicating pregnancy, unspecified trimester: Secondary | ICD-10-CM

## 2017-06-30 DIAGNOSIS — O321XX0 Maternal care for breech presentation, not applicable or unspecified: Secondary | ICD-10-CM | POA: Diagnosis not present

## 2017-06-30 DIAGNOSIS — E669 Obesity, unspecified: Secondary | ICD-10-CM | POA: Diagnosis not present

## 2017-07-01 ENCOUNTER — Encounter: Payer: Self-pay | Admitting: Obstetrics & Gynecology

## 2017-07-01 ENCOUNTER — Other Ambulatory Visit (HOSPITAL_COMMUNITY): Payer: Self-pay | Admitting: *Deleted

## 2017-07-01 ENCOUNTER — Ambulatory Visit (INDEPENDENT_AMBULATORY_CARE_PROVIDER_SITE_OTHER): Payer: Managed Care, Other (non HMO) | Admitting: Obstetrics & Gynecology

## 2017-07-01 VITALS — BP 123/68 | HR 80

## 2017-07-01 DIAGNOSIS — O99283 Endocrine, nutritional and metabolic diseases complicating pregnancy, third trimester: Secondary | ICD-10-CM

## 2017-07-01 DIAGNOSIS — O99891 Other specified diseases and conditions complicating pregnancy: Secondary | ICD-10-CM

## 2017-07-01 DIAGNOSIS — O9989 Other specified diseases and conditions complicating pregnancy, childbirth and the puerperium: Secondary | ICD-10-CM

## 2017-07-01 DIAGNOSIS — M069 Rheumatoid arthritis, unspecified: Secondary | ICD-10-CM

## 2017-07-01 DIAGNOSIS — E039 Hypothyroidism, unspecified: Secondary | ICD-10-CM

## 2017-07-01 DIAGNOSIS — O30043 Twin pregnancy, dichorionic/diamniotic, third trimester: Secondary | ICD-10-CM

## 2017-07-01 DIAGNOSIS — O0993 Supervision of high risk pregnancy, unspecified, third trimester: Secondary | ICD-10-CM

## 2017-07-01 NOTE — Progress Notes (Signed)
PRENATAL VISIT NOTE  Subjective:  Lori Valentine is a 32 y.o. G2P1001 at [redacted]w[redacted]d being seen today for ongoing prenatal care.  She is currently monitored for the following issues for this high-risk pregnancy and has Hypothyroidism affecting pregnancy; Rheumatoid arthritis (HCC); History of anxiety and depression; Smoker; Supervision of high-risk pregnancy; Twin gestation, dichorionic diamniotic; Obesity in pregnancy, antepartum; and Maternal rheumatoid arthritis complicating pregnancy (HCC) on their problem list.  Patient reports pelvic pressure.  Contractions: Not present. Vag. Bleeding: None.  Movement: Present. Denies leaking of fluid.   The following portions of the patient's history were reviewed and updated as appropriate: allergies, current medications, past family history, past medical history, past social history, past surgical history and problem list. Problem list updated.  Objective:   Vitals:   07/01/17 1141  BP: 123/68  Pulse: 80    Fetal Status: Fetal Heart Rate (bpm): 142/136    Movement: Present     General:  Alert, oriented and cooperative. Patient is in no acute distress.  Skin: Skin is warm and dry. No rash noted.   Cardiovascular: Normal heart rate noted  Respiratory: Normal respiratory effort, no problems with respiration noted  Abdomen: Soft, gravid, appropriate for gestational age.  Pain/Pressure: Present     Pelvic: Cervical exam performed Dilation: Closed Effacement (%): Thick Station: Ballotable  Extremities: Normal range of motion.  Edema: Moderate pitting, indentation subsides rapidly  Mental Status:  Normal mood and affect. Normal behavior. Normal judgment and thought content.    [place] Mfm Ob Follow Up  Result Date: 06/30/2017 ----------------------------------------------------------------------  OBSTETRICS REPORT                      (Signed Final 06/30/2017 04:34 pm) ---------------------------------------------------------------------- Patient Info  ID #:        [id number]                          D.O.B.:  [birth year] (31 yrs)  Name:       Lori Valentine                    Visit Date: 06/30/2017 04:18 pm ---------------------------------------------------------------------- Performed By  Performed By:     Eden Lathe BS      Ref. Address:     [address]                    RDMS RVT                                                             [address] [place],                                                             [place] [address]  Attending:        Charlsie Merles MD         Location:         [facility]  Referred By:      Marny Lowenstein  PA ---------------------------------------------------------------------- Orders   #  Description                                 Code   1  [place] MFM OB FOLLOW UP                         [id number]   2  [place] MFM OB FOLLOW UP ADDL GEST               [address].60  ----------------------------------------------------------------------   #  Ordered By               Order #        Accession #    Episode #   1  Particia Nearing            [id number]      [id number]     [id number]   2  MARTHA DECKER            [id number]      [id number]     [id number]  ---------------------------------------------------------------------- Indications   [redacted] weeks gestation of pregnancy                Z3A.31   Encounter for other antenatal screening        Z36.2   follow-up   Twin pregnancy, di/di, third trimester         O30.043   Hypothyroid (Synthroid)                        O99.280 E03.9   Obesity complicating pregnancy, third          O99.213   trimester  ---------------------------------------------------------------------- OB History  Blood Type:            Height:  5'8"   Weight (lb):  255       BMI:  38.77  Gravidity:    2         Term:   1  Living:       1 ---------------------------------------------------------------------- Fetal Evaluation (Fetus A)  Num Of Fetuses:     2  Fetal Heart         149  Rate(bpm):  Cardiac Activity:   Observed  Fetal Lie:           Maternal right side  Presentation:       Cephalic  Placenta:           Posterior, above cervical os  P. Cord Insertion:  Previously Visualized  Amniotic Fluid  AFI FV:      Subjectively within normal limits                              Largest Pocket(cm)                              3.46 ---------------------------------------------------------------------- Biometry (Fetus A)  BPD:      79.2  mm     G. Age:  31w 5d         60  %    CI:        74.79   %    70 - 86  FL/HC:      19.3   %    19.3 - 21.3  HC:      290.6  mm     G. Age:  32w 0d         37  %    HC/AC:      1.08        0.96 - 1.17  AC:      268.9  mm     G. Age:  31w 0d         43  %    FL/BPD:     71.0   %    71 - 87  FL:       56.2  mm     G. Age:  29w 4d          7  %    FL/AC:      20.9   %    20 - 24  HUM:      50.8  mm     G. Age:  29w 5d         22  %  Est. FW:    1622  gm      3 lb 9 oz     46  %     FW Discordancy        23  % ---------------------------------------------------------------------- Gestational Age (Fetus A)  LMP:           32w 1d        Date:  11/17/16                 EDD:   08/24/17  U/S Today:     31w 1d                                        EDD:   08/31/17  Best:          31w 1d     Det. ByMarcella Dubs         EDD:   08/31/17                                      (01/21/17) ---------------------------------------------------------------------- Anatomy (Fetus A)  Cranium:               Appears normal         Aortic Arch:            Previously seen  Cavum:                 Appears normal         Ductal Arch:            Previously seen  Ventricles:            Appears normal         Diaphragm:              Previously seen  Choroid Plexus:        Appears normal         Stomach:                Appears normal, left  sided  Cerebellum:            Previously seen        Abdomen:                Appears  normal  Posterior Fossa:       Previously seen        Abdominal Wall:         Previously seen  Nuchal Fold:           Previously seen        Cord Vessels:           Previously seen  Face:                  Appears normal         Kidneys:                Appear normal                         (orbits and profile)  Lips:                  Not well visualized    Bladder:                Appears normal  Thoracic:              Appears normal         Spine:                  Previously seen  Heart:                 Previously seen        Upper Extremities:      Previously seen  RVOT:                  Not well visualized    Lower Extremities:      Previously seen  LVOT:                  Appears normal  Other:  Fetus appears to be a female. Technically difficult due to fetal position. ---------------------------------------------------------------------- Fetal Evaluation (Fetus B)  Num Of Fetuses:     2  Fetal Heart         158  Rate(bpm):  Cardiac Activity:   Observed  Fetal Lie:          Maternal left side  Presentation:       Breech  Placenta:           Anterior, above cervical os  P. Cord Insertion:  Previously Visualized  Amniotic Fluid  AFI FV:      Subjectively within normal limits                              Largest Pocket(cm)                              4.3 ---------------------------------------------------------------------- Biometry (Fetus B)  BPD:      80.5  mm     G. Age:  32w 2d         75  %    CI:        70.88   %    70 - 86  FL/HC:      20.5   %    19.3 - 21.3  HC:      304.7  mm     G. Age:  33w 6d         85  %    HC/AC:      1.03        0.96 - 1.17  AC:      294.4  mm     G. Age:  33w 3d         95  %    FL/BPD:     77.6   %    71 - 87  FL:       62.5  mm     G. Age:  32w 2d         70  %    FL/AC:      21.2   %    20 - 24  HUM:      55.9  mm     G. Age:  32w 4d         79  %  Est. FW:    2112  gm    4 lb 10 oz      82  %     FW Discordancy     0 \ 23 %  ---------------------------------------------------------------------- Gestational Age (Fetus B)  LMP:           32w 1d        Date:  11/17/16                 EDD:   08/24/17  U/S Today:     33w 0d                                        EDD:   08/18/17  Best:          31w 1d     Det. ByMarcella Dubs         EDD:   08/31/17                                      (01/21/17) ---------------------------------------------------------------------- Anatomy (Fetus B)  Cranium:               Appears normal         Aortic Arch:            Previously seen  Cavum:                 Previously seen        Ductal Arch:            Previously seen  Ventricles:            Appears normal         Diaphragm:              Previously seen  Choroid Plexus:        Previously seen        Stomach:                Appears normal, left  sided  Cerebellum:            Previously seen        Abdomen:                Appears normal  Posterior Fossa:       Previously seen        Abdominal Wall:         Previously seen  Nuchal Fold:           Previously seen        Cord Vessels:           Previously seen  Face:                  Orbits and profile     Kidneys:                Appear normal                         previously seen  Lips:                  Previously seen        Bladder:                Appears normal  Thoracic:              Appears normal         Spine:                  Previously seen  Heart:                 Previously seen        Upper Extremities:      Previously seen  RVOT:                  Previously seen        Lower Extremities:      Previously seen  LVOT:                  Previously seen  Other:  Fetus appears to be a female. Heels prev. visualized. Right 5th prev.          visualized. ---------------------------------------------------------------------- Impression  Dichorionic/diamniotic twin pregnancy at 31+1 weeks  Twin A:  Presentation iscephalic  Interval  review of fetal anatomy was normal  Normal amniotic fluid volume  Appropriate interval growth with EFW at the 46th percentile  Twin B:  Presentation is breech  Interval review of fetal anatomy was normal  Normal amniotic fluid volume  Appropriate interval growth with EFW at the 82nd percentile  Dicordance is 23% ---------------------------------------------------------------------- Recommendations  Recommend follow-up ultrasound examination in 4 weeks for  growth evaluation  the elevated discordance is not worrisome in the situation of  a normally grown twin and a moderately large one ----------------------------------------------------------------------                 Charlsie Merles, MD Electronically Signed Final Report   06/30/2017 04:34 pm ----------------------------------------------------------------------  [place] Mfm Ob Follow Up  Result Date: 06/02/2017 ----------------------------------------------------------------------  OBSTETRICS REPORT                      (Signed Final 06/02/2017 08:21 pm) ---------------------------------------------------------------------- Patient Info  ID #:       [id number]  D.O.B.:  Nov 23, 1985 (31 yrs)  Name:       TAHARA Ledvina                    Visit Date: 06/02/2017 04:01 pm ---------------------------------------------------------------------- Performed By  Performed By:     Vivien Rota        Ref. Address:     [address] [place],                                                             [place] [address]  Attending:        Particia Nearing MD       Location:         [facility]  Referred By:      Marny Lowenstein                    [place] ---------------------------------------------------------------------- Orders   #  Description                                 Code   1  [place] MFM OB FOLLOW UP                         [phone]   2  [place] MFM OB FOLLOW UP ADDL GEST                81191.47  ----------------------------------------------------------------------   #  Ordered By               Order #        Accession #    Episode #   1  Particia Nearing            [id number]      [id number]     [id number]   2  MARTHA DECKER            [id number]      [id number]     [id number]  ---------------------------------------------------------------------- Indications   [redacted] weeks gestation of pregnancy                Z3A.27   Encounter for other antenatal screening        Z36.2   follow-up   Twin pregnancy, di/di, second trimester        O30.042   Hypothyroid (Synthroid)                        O99.280 E03.9   Obesity complicating pregnancy, second         O99.212   trimester (Prepreg BMI 36.3)  ---------------------------------------------------------------------- OB History  Blood Type:            Height:  5'8"  Weight (lb):  255       BMI:  38.77  Gravidity:    2         Term:   1  Living:       1 ---------------------------------------------------------------------- Fetal Evaluation (Fetus A)  Num Of Fetuses:     2  Fetal Heart         146  Rate(bpm):  Cardiac Activity:   Observed  Fetal Lie:          Maternal right side  Presentation:       Cephalic  Placenta:           Posterior, above cervical os  P. Cord Insertion:  Previously Visualized  Amniotic Fluid  AFI FV:      Subjectively within normal limits                              Largest Pocket(cm)                              5.73 ---------------------------------------------------------------------- Biometry (Fetus A)  BPD:      67.9  mm     G. Age:  27w 3d         45  %    CI:        72.42   %    70 - 86                                                          FL/HC:      20.0   %    18.6 - 20.4  HC:      253.8  mm     G. Age:  27w 4d         36  %    HC/AC:      1.13        1.05 - 1.21  AC:      225.4  mm     G. Age:  27w 0d         36  %    FL/BPD:     74.7   %    71 - 87  FL:       50.7  mm     G. Age:  27w 1d         37  %    FL/AC:       22.5   %    20 - 24  HUM:      46.1  mm     G. Age:  27w 1d         48  %  Est. FW:    1028  gm      2 lb 4 oz     52  %     FW Discordancy        25  % ---------------------------------------------------------------------- Gestational Age (Fetus A)  LMP:           28w 1d        Date:  11/17/16                 EDD:   08/24/17  U/S Today:  27w 2d                                        EDD:   08/30/17  Best:          27w 1d     Det. ByMarcella Dubs         EDD:   08/31/17                                      (01/21/17) ---------------------------------------------------------------------- Anatomy (Fetus A)  Cranium:               Previously seen        Aortic Arch:            Previously seen  Cavum:                 Previously seen        Ductal Arch:            Previously seen  Ventricles:            Previously seen        Diaphragm:              Previously seen  Choroid Plexus:        Previously seen        Stomach:                Appears normal, left                                                                        sided  Cerebellum:            Previously seen        Abdomen:                Appears normal  Posterior Fossa:       Previously seen        Abdominal Wall:         Previously seen  Nuchal Fold:           Previously seen        Cord Vessels:           Previously seen  Face:                  Orbits and profile     Kidneys:                Previously seen                         previously seen  Lips:                  Not well visualized    Bladder:                Appears normal  Thoracic:              Appears normal         Spine:  Previously seen  Heart:                 Previously seen        Upper Extremities:      Previously seen  RVOT:                  Not well visualized    Lower Extremities:      Previously seen  LVOT:                  Previously seen  Other:  Fetus appears to be a female. Technically difficult due to fetal position.  ---------------------------------------------------------------------- Fetal Evaluation (Fetus B)  Num Of Fetuses:     2  Fetal Heart         153  Rate(bpm):  Cardiac Activity:   Observed  Fetal Lie:          Maternal left side  Presentation:       Cephalic  Placenta:           Anterior, above cervical os  P. Cord Insertion:  Previously Visualized  Amniotic Fluid  AFI FV:      Subjectively within normal limits                              Largest Pocket(cm)                              5.07 ---------------------------------------------------------------------- Biometry (Fetus B)  BPD:      70.7  mm     G. Age:  28w 3d         79  %    CI:        72.55   %    70 - 86                                                          FL/HC:      20.8   %    18.6 - 20.4  HC:       264   mm     G. Age:  28w 5d         74  %    HC/AC:      1.03        1.05 - 1.21  AC:       256   mm     G. Age:  29w 6d       > 97  %    FL/BPD:     77.5   %    71 - 87  FL:       54.8  mm     G. Age:  28w 6d         84  %    FL/AC:      21.4   %    20 - 24  HUM:      51.5  mm     G. Age:  30w 1d       > 95  %  Est. FW:    1369  gm           3 lb     84  %  FW Discordancy     0 \ 25 % ---------------------------------------------------------------------- Gestational Age (Fetus B)  LMP:           28w 1d        Date:  11/17/16                 EDD:   08/24/17  U/S Today:     29w 0d                                        EDD:   08/18/17  Best:          27w 1d     Det. ByMarcella Dubs         EDD:   08/31/17                                      (01/21/17) ---------------------------------------------------------------------- Anatomy (Fetus B)  Cranium:               Appears normal         Aortic Arch:            Previously seen  Cavum:                 Previously seen        Ductal Arch:            Previously seen  Ventricles:            Previously seen        Diaphragm:              Previously seen  Choroid Plexus:        Previously seen         Stomach:                Appears normal, left                                                                        sided  Cerebellum:            Previously seen        Abdomen:                Appears normal  Posterior Fossa:       Previously seen        Abdominal Wall:         Previously seen  Nuchal Fold:           Previously seen        Cord Vessels:           Previously seen  Face:                  Orbits and profile     Kidneys:                Appear normal                         previously seen  Lips:  Previously seen        Bladder:                Appears normal  Thoracic:              Appears normal         Spine:                  Previously seen  Heart:                 Previously seen        Upper Extremities:      Previously seen  RVOT:                  Previously seen        Lower Extremities:      Previously seen  LVOT:                  Previously seen  Other:  Fetus appears to be a female. Heels prev. visualized. Right 5th prev.          visualized. ---------------------------------------------------------------------- Cervix Uterus Adnexa  Cervix  Length:              4  cm.  Normal appearance by transabdominal scan.  Uterus  No abnormality visualized.  Left Ovary  Not visualized.  Right Ovary  Not visualized.  Adnexa:       No abnormality visualized. No adnexal mass                visualized. ---------------------------------------------------------------------- Impression  Dichorionic/diamniotic twin pregnancy at 27+1 weeks with  cardiac activity x 2  Normal interval anatomy x 2; anatomic survey complete  except for Twin A's upper lip and RVOT  Normal amniotic fluid volume x 2  Appropriate interval growths with EFWs at the 52nd and 84th  %tiles ---------------------------------------------------------------------- Recommendations  Follow-up ultrasound for growth in 4 weeks ----------------------------------------------------------------------                 Particia Nearing, MD  Electronically Signed Final Report   06/02/2017 08:21 pm ----------------------------------------------------------------------  [place] Mfm Ob Follow Up Addl Gest  Result Date: 06/30/2017 ----------------------------------------------------------------------  OBSTETRICS REPORT                      (Signed Final 06/30/2017 04:34 pm) ---------------------------------------------------------------------- Patient Info  ID #:       [id number]                          D.O.B.:  06/18/1985 (31 yrs)  Name:       Lori Valentine                    Visit Date: 06/30/2017 04:18 pm ---------------------------------------------------------------------- Performed By  Performed By:     Eden Lathe BS      Ref. Address:     [address]                    RDMS RVT                                                             [address] [place],  [place] [address]  Attending:        Charlsie Merles MD         Location:         [facility]  Referred By:      Marny Lowenstein                    [place] ---------------------------------------------------------------------- Orders   #  Description                                 Code   1  [place] MFM OB FOLLOW UP                         [phone]   2  [place] MFM OB FOLLOW UP ADDL GEST               32440.10  ----------------------------------------------------------------------   #  Ordered By               Order #        Accession #    Episode #   1  Particia Nearing            [id number]      [id number]     [id number]   2  MARTHA DECKER            [id number]      [id number]     [id number]  ---------------------------------------------------------------------- Indications   [redacted] weeks gestation of pregnancy                Z3A.31   Encounter for other antenatal screening        Z36.2   follow-up   Twin pregnancy, di/di, third trimester         O30.043   Hypothyroid (Synthroid)                        O99.280 E03.9   Obesity complicating pregnancy, third           O99.213   trimester  ---------------------------------------------------------------------- OB History  Blood Type:            Height:  5'8"   Weight (lb):  255       BMI:  38.77  Gravidity:    2         Term:   1  Living:       1 ---------------------------------------------------------------------- Fetal Evaluation (Fetus A)  Num Of Fetuses:     2  Fetal Heart         149  Rate(bpm):  Cardiac Activity:   Observed  Fetal Lie:          Maternal right side  Presentation:       Cephalic  Placenta:           Posterior, above cervical os  P. Cord Insertion:  Previously Visualized  Amniotic Fluid  AFI FV:      Subjectively within normal limits                              Largest Pocket(cm)                              3.46 ---------------------------------------------------------------------- Biometry (Fetus A)  BPD:  79.2  mm     G. Age:  31w 5d         60  %    CI:        74.79   %    70 - 86                                                          FL/HC:      19.3   %    19.3 - 21.3  HC:      290.6  mm     G. Age:  32w 0d         37  %    HC/AC:      1.08        0.96 - 1.17  AC:      268.9  mm     G. Age:  31w 0d         43  %    FL/BPD:     71.0   %    71 - 87  FL:       56.2  mm     G. Age:  29w 4d          7  %    FL/AC:      20.9   %    20 - 24  HUM:      50.8  mm     G. Age:  29w 5d         22  %  Est. FW:    1622  gm      3 lb 9 oz     46  %     FW Discordancy        23  % ---------------------------------------------------------------------- Gestational Age (Fetus A)  LMP:           32w 1d        Date:  11/17/16                 EDD:   08/24/17  U/S Today:     31w 1d                                        EDD:   08/31/17  Best:          31w 1d     Det. By:  Marcella Dubs         EDD:   08/31/17                                      (01/21/17) ---------------------------------------------------------------------- Anatomy (Fetus A)  Cranium:               Appears normal         Aortic Arch:             Previously seen  Cavum:                 Appears normal         Ductal Arch:            Previously seen  Ventricles:            Appears normal         Diaphragm:              Previously seen  Choroid Plexus:        Appears normal         Stomach:                Appears normal, left                                                                        sided  Cerebellum:            Previously seen        Abdomen:                Appears normal  Posterior Fossa:       Previously seen        Abdominal Wall:         Previously seen  Nuchal Fold:           Previously seen        Cord Vessels:           Previously seen  Face:                  Appears normal         Kidneys:                Appear normal                         (orbits and profile)  Lips:                  Not well visualized    Bladder:                Appears normal  Thoracic:              Appears normal         Spine:                  Previously seen  Heart:                 Previously seen        Upper Extremities:      Previously seen  RVOT:                  Not well visualized    Lower Extremities:      Previously seen  LVOT:                  Appears normal  Other:  Fetus appears to be a female. Technically difficult due to fetal position. ---------------------------------------------------------------------- Fetal Evaluation (Fetus B)  Num Of Fetuses:     2  Fetal Heart         158  Rate(bpm):  Cardiac Activity:   Observed  Fetal Lie:          Maternal left side  Presentation:       Breech  Placenta:           Anterior, above cervical os  P. Cord  Insertion:  Previously Visualized  Amniotic Fluid  AFI FV:      Subjectively within normal limits                              Largest Pocket(cm)                              4.3 ---------------------------------------------------------------------- Biometry (Fetus B)  BPD:      80.5  mm     G. Age:  32w 2d         75  %    CI:        70.88   %    70 - 86                                                           FL/HC:      20.5   %    19.3 - 21.3  HC:      304.7  mm     G. Age:  33w 6d         85  %    HC/AC:      1.03        0.96 - 1.17  AC:      294.4  mm     G. Age:  33w 3d         95  %    FL/BPD:     77.6   %    71 - 87  FL:       62.5  mm     G. Age:  32w 2d         70  %    FL/AC:      21.2   %    20 - 24  HUM:      55.9  mm     G. Age:  32w 4d         79  %  Est. FW:    2112  gm    4 lb 10 oz      82  %     FW Discordancy     0 \ 23 % ---------------------------------------------------------------------- Gestational Age (Fetus B)  LMP:           32w 1d        Date:  11/17/16                 EDD:   08/24/17  U/S Today:     33w 0d                                        EDD:   08/18/17  Best:          31w 1d     Det. ByMarcella Dubs         EDD:   08/31/17                                      (01/21/17) ---------------------------------------------------------------------- Anatomy (  Fetus B)  Cranium:               Appears normal         Aortic Arch:            Previously seen  Cavum:                 Previously seen        Ductal Arch:            Previously seen  Ventricles:            Appears normal         Diaphragm:              Previously seen  Choroid Plexus:        Previously seen        Stomach:                Appears normal, left                                                                        sided  Cerebellum:            Previously seen        Abdomen:                Appears normal  Posterior Fossa:       Previously seen        Abdominal Wall:         Previously seen  Nuchal Fold:           Previously seen        Cord Vessels:           Previously seen  Face:                  Orbits and profile     Kidneys:                Appear normal                         previously seen  Lips:                  Previously seen        Bladder:                Appears normal  Thoracic:              Appears normal         Spine:                  Previously seen  Heart:                 Previously seen        Upper  Extremities:      Previously seen  RVOT:                  Previously seen        Lower Extremities:      Previously seen  LVOT:                  Previously seen  Other:  Fetus  appears to be a female. Heels prev. visualized. Right 5th prev.          visualized. ---------------------------------------------------------------------- Impression  Dichorionic/diamniotic twin pregnancy at 31+1 weeks  Twin A:  Presentation iscephalic  Interval review of fetal anatomy was normal  Normal amniotic fluid volume  Appropriate interval growth with EFW at the 46th percentile  Twin B:  Presentation is breech  Interval review of fetal anatomy was normal  Normal amniotic fluid volume  Appropriate interval growth with EFW at the 82nd percentile  Dicordance is 23% ---------------------------------------------------------------------- Recommendations  Recommend follow-up ultrasound examination in 4 weeks for  growth evaluation  the elevated discordance is not worrisome in the situation of  a normally grown twin and a moderately large one ----------------------------------------------------------------------                 Charlsie Merles, MD Electronically Signed Final Report   06/30/2017 04:34 pm ----------------------------------------------------------------------  [place] Mfm Ob Follow Up Addl Gest  Result Date: 06/02/2017 ----------------------------------------------------------------------  OBSTETRICS REPORT                      (Signed Final 06/02/2017 08:21 pm) ---------------------------------------------------------------------- Patient Info  ID #:       [id number]                          D.O.B.:  12/09/1985 (31 yrs)  Name:       Lori Valentine                    Visit Date: 06/02/2017 04:01 pm ---------------------------------------------------------------------- Performed By  Performed By:     Vivien Rota        Ref. Address:     [address] [place],                                                             [place] [address]  Attending:        Particia Nearing MD       Location:         [facility]  Referred By:      Marny Lowenstein                    [place] ---------------------------------------------------------------------- Orders   #  Description                                 Code   1  [place] MFM OB FOLLOW UP                         62130.86   2  [place] MFM OB FOLLOW UP ADDL GEST  16109.60  ----------------------------------------------------------------------   #  Ordered By               Order #        Accession #    Episode #   1  Particia Nearing            [id number]      [id number]     [id number]   2  MARTHA DECKER            [id number]      [id number]     [id number]  ---------------------------------------------------------------------- Indications   [redacted] weeks gestation of pregnancy                Z3A.27   Encounter for other antenatal screening        Z36.2   follow-up   Twin pregnancy, di/di, second trimester        O30.042   Hypothyroid (Synthroid)                        O99.280 E03.9   Obesity complicating pregnancy, second         O99.212   trimester (Prepreg BMI 36.3)  ---------------------------------------------------------------------- OB History  Blood Type:            Height:  5'8"   Weight (lb):  255       BMI:  38.77  Gravidity:    2         Term:   1  Living:       1 ---------------------------------------------------------------------- Fetal Evaluation (Fetus A)  Num Of Fetuses:     2  Fetal Heart         146  Rate(bpm):  Cardiac Activity:   Observed  Fetal Lie:          Maternal right side  Presentation:       Cephalic  Placenta:           Posterior, above cervical os  P. Cord Insertion:  Previously Visualized  Amniotic Fluid  AFI FV:      Subjectively within normal limits                              Largest Pocket(cm)                              5.73 ----------------------------------------------------------------------  Biometry (Fetus A)  BPD:      67.9  mm     G. Age:  27w 3d         45  %    CI:        72.42   %    70 - 86                                                          FL/HC:      20.0   %    18.6 - 20.4  HC:      253.8  mm     G. Age:  27w 4d         36  %    HC/AC:      1.13  1.05 - 1.21  AC:      225.4  mm     G. Age:  27w 0d         36  %    FL/BPD:     74.7   %    71 - 87  FL:       50.7  mm     G. Age:  27w 1d         37  %    FL/AC:      22.5   %    20 - 24  HUM:      46.1  mm     G. Age:  27w 1d         48  %  Est. FW:    1028  gm      2 lb 4 oz     52  %     FW Discordancy        25  % ---------------------------------------------------------------------- Gestational Age (Fetus A)  LMP:           28w 1d        Date:  11/17/16                 EDD:   08/24/17  U/S Today:     27w 2d                                        EDD:   08/30/17  Best:          27w 1d     Det. ByMarcella Dubs         EDD:   08/31/17                                      (01/21/17) ---------------------------------------------------------------------- Anatomy (Fetus A)  Cranium:               Previously seen        Aortic Arch:            Previously seen  Cavum:                 Previously seen        Ductal Arch:            Previously seen  Ventricles:            Previously seen        Diaphragm:              Previously seen  Choroid Plexus:        Previously seen        Stomach:                Appears normal, left                                                                        sided  Cerebellum:            Previously seen  Abdomen:                Appears normal  Posterior Fossa:       Previously seen        Abdominal Wall:         Previously seen  Nuchal Fold:           Previously seen        Cord Vessels:           Previously seen  Face:                  Orbits and profile     Kidneys:                Previously seen                         previously seen  Lips:                  Not well visualized    Bladder:                 Appears normal  Thoracic:              Appears normal         Spine:                  Previously seen  Heart:                 Previously seen        Upper Extremities:      Previously seen  RVOT:                  Not well visualized    Lower Extremities:      Previously seen  LVOT:                  Previously seen  Other:  Fetus appears to be a female. Technically difficult due to fetal position. ---------------------------------------------------------------------- Fetal Evaluation (Fetus B)  Num Of Fetuses:     2  Fetal Heart         153  Rate(bpm):  Cardiac Activity:   Observed  Fetal Lie:          Maternal left side  Presentation:       Cephalic  Placenta:           Anterior, above cervical os  P. Cord Insertion:  Previously Visualized  Amniotic Fluid  AFI FV:      Subjectively within normal limits                              Largest Pocket(cm)                              5.07 ---------------------------------------------------------------------- Biometry (Fetus B)  BPD:      70.7  mm     G. Age:  28w 3d         79  %    CI:        72.55   %    70 - 86  FL/HC:      20.8   %    18.6 - 20.4  HC:       264   mm     G. Age:  28w 5d         74  %    HC/AC:      1.03        1.05 - 1.21  AC:       256   mm     G. Age:  29w 6d       > 97  %    FL/BPD:     77.5   %    71 - 87  FL:       54.8  mm     G. Age:  28w 6d         84  %    FL/AC:      21.4   %    20 - 24  HUM:      51.5  mm     G. Age:  30w 1d       > 95  %  Est. FW:    1369  gm           3 lb     84  %     FW Discordancy     0 \ 25 % ---------------------------------------------------------------------- Gestational Age (Fetus B)  LMP:           28w 1d        Date:  11/17/16                 EDD:   08/24/17  U/S Today:     29w 0d                                        EDD:   08/18/17  Best:          27w 1d     Det. ByMarcella Dubs         EDD:   08/31/17                                       (01/21/17) ---------------------------------------------------------------------- Anatomy (Fetus B)  Cranium:               Appears normal         Aortic Arch:            Previously seen  Cavum:                 Previously seen        Ductal Arch:            Previously seen  Ventricles:            Previously seen        Diaphragm:              Previously seen  Choroid Plexus:        Previously seen        Stomach:                Appears normal, left  sided  Cerebellum:            Previously seen        Abdomen:                Appears normal  Posterior Fossa:       Previously seen        Abdominal Wall:         Previously seen  Nuchal Fold:           Previously seen        Cord Vessels:           Previously seen  Face:                  Orbits and profile     Kidneys:                Appear normal                         previously seen  Lips:                  Previously seen        Bladder:                Appears normal  Thoracic:              Appears normal         Spine:                  Previously seen  Heart:                 Previously seen        Upper Extremities:      Previously seen  RVOT:                  Previously seen        Lower Extremities:      Previously seen  LVOT:                  Previously seen  Other:  Fetus appears to be a female. Heels prev. visualized. Right 5th prev.          visualized. ---------------------------------------------------------------------- Cervix Uterus Adnexa  Cervix  Length:              4  cm.  Normal appearance by transabdominal scan.  Uterus  No abnormality visualized.  Left Ovary  Not visualized.  Right Ovary  Not visualized.  Adnexa:       No abnormality visualized. No adnexal mass                visualized. ---------------------------------------------------------------------- Impression  Dichorionic/diamniotic twin pregnancy at 27+1 weeks with  cardiac activity x 2  Normal interval anatomy x 2;  anatomic survey complete  except for Twin A's upper lip and RVOT  Normal amniotic fluid volume x 2  Appropriate interval growths with EFWs at the 52nd and 84th  %tiles ---------------------------------------------------------------------- Recommendations  Follow-up ultrasound for growth in 4 weeks ----------------------------------------------------------------------                 Particia Nearing, MD Electronically Signed Final Report   06/02/2017 08:21 pm ----------------------------------------------------------------------   Assessment and Plan:  Pregnancy: G2P1001 at [redacted]w[redacted]d  1. Dichorionic diamniotic twin pregnancy in third trimester Discordant growth, see MFM recommendations above. Talked to Dr. Otho Perl (MFM), he agrees that antenatal testing is not required in this case  given normal EFWs for both twins. Will do follow up scan in 4 weeks as scheduled. Continue to follow closely.   2. Maternal rheumatoid arthritis complicating pregnancy (HCC) On Certolizumab  3. Hypothyroidism affecting pregnancy in third trimester Continue Levoxyl  4. Supervision of high risk pregnancy in third trimester Preterm labor symptoms and general obstetric precautions including but not limited to vaginal bleeding, contractions, leaking of fluid and fetal movement were reviewed in detail with the patient. Please refer to After Visit Summary for other counseling recommendations.  Return in about 2 weeks (around 07/15/2017) for OB Visit (HOB).   Jaynie Collins, MD

## 2017-07-01 NOTE — Patient Instructions (Addendum)
Return to clinic for any scheduled appointments or obstetric concerns, or go to [facility] for evaluation  Places to have your son circumcised:    [facility] [phone] $480 while you are in hospital  [facility] [phone] $244 by 4 wks  [facility] [phone] $175 by 2 wks  [facility] [phone] $250 by 7 days [facility] [phone] $[phone] by 4 wks  These prices sometimes change but are roughly what you can expect to pay. Please call and confirm pricing.   Circumcision is considered an elective/non-medically necessary procedure. There are many reasons parents decide to have their sons circumsized. During the first year of life circumcised males have a reduced risk of urinary tract infections but after this year the rates between circumcised males and uncircumcised males are the same.  It is safe to have your son circumcised outside of the hospital and the places above perform them regularly.   Deciding about Circumcision in Baby Boys  (Up-to-date The Basics)  What is circumcision?  Circumcision is a surgery that removes the skin that covers the tip of the penis, called the "foreskin" Circumcision is usually done when a boy is between 27 and 2 days old. In the [place], circumcision is common. In some other countries, fewer boys are circumcised. Circumcision is a common tradition in some religions.  Should I have my baby boy circumcised?  There is no easy answer. Circumcision has some benefits. But it also has risks. After talking with your doctor, you will have to decide for yourself what is right for your family.  What are the benefits of circumcision?  Circumcised boys seem to have slightly lower rates of: ?Urinary tract infections ?Swelling of the opening at the tip of the penis Circumcised men seem to have slightly  lower rates of: ?Urinary tract infections ?Swelling of the opening at the tip of the penis ?Penis cancer ?HIV and other infections that you catch during sex ?Cervical cancer in the women they have sex with Even so, in the [place], the risks of these problems are small - even in boys and men who have not been circumcised. Plus, boys and men who are not circumcised can reduce these extra risks by: ?Cleaning their penis well ?Using condoms during sex  What are the risks of circumcision?  Risks include: ?Bleeding or infection from the surgery ?Damage to or amputation of the penis ?A chance that the doctor will cut off too much or not enough of the foreskin ?A chance that sex won't feel as good later in life Only about 1 out of every 200 circumcisions leads to problems. There is also a chance that your health insurance won't pay for circumcision.  How is circumcision done in baby boys?  First, the baby gets medicine for pain relief. This might be a cream on the skin or a shot into the base of the penis. Next, the doctor cleans the baby's penis well. Then he or she uses special tools to cut off the foreskin. Finally, the doctor wraps a bandage (called gauze) around the baby's penis. If you have your baby circumcised, his doctor or nurse will give you instructions on how to care for him after the surgery. It is important that you follow those instructions carefully.

## 2017-07-06 ENCOUNTER — Encounter: Payer: Self-pay | Admitting: *Deleted

## 2017-07-07 ENCOUNTER — Ambulatory Visit (INDEPENDENT_AMBULATORY_CARE_PROVIDER_SITE_OTHER): Payer: Managed Care, Other (non HMO) | Admitting: Obstetrics & Gynecology

## 2017-07-07 ENCOUNTER — Encounter: Payer: Self-pay | Admitting: Obstetrics & Gynecology

## 2017-07-07 VITALS — BP 121/72 | HR 105 | Temp 97.8°F | Wt 303.8 lb

## 2017-07-07 DIAGNOSIS — O30043 Twin pregnancy, dichorionic/diamniotic, third trimester: Secondary | ICD-10-CM

## 2017-07-07 DIAGNOSIS — O0993 Supervision of high risk pregnancy, unspecified, third trimester: Secondary | ICD-10-CM

## 2017-07-07 NOTE — Progress Notes (Signed)
   PRENATAL VISIT NOTE  Subjective:  Lori Valentine is a 32 y.o. G2P1001 at [redacted]w[redacted]d being seen today for ongoing prenatal care.  She is currently monitored for the following issues for this high-risk pregnancy and has Hypothyroidism affecting pregnancy; Rheumatoid arthritis (HCC); History of anxiety and depression; Smoker; Supervision of high-risk pregnancy; Twin gestation, dichorionic diamniotic; Obesity in pregnancy, antepartum; and Maternal rheumatoid arthritis complicating pregnancy (HCC) on their problem list.  Patient reports no complaints.  Contractions: Not present. Vag. Bleeding: None.  Movement: Present. Denies leaking of fluid.   The following portions of the patient's history were reviewed and updated as appropriate: allergies, current medications, past family history, past medical history, past social history, past surgical history and problem list. Problem list updated.  Objective:   Vitals:   07/07/17 1639  BP: 121/72  Pulse: (!) 105  Temp: 97.8 F (36.6 C)  Weight: (!) 303 lb 12.8 oz (137.8 kg)    Fetal Status: Fetal Heart Rate (bpm): 131/149 Fundal Height: 40 cm Movement: Present     General:  Alert, oriented and cooperative. Patient is in no acute distress.  Skin: Skin is warm and dry. No rash noted.   Cardiovascular: Normal heart rate noted  Respiratory: Normal respiratory effort, no problems with respiration noted  Abdomen: Soft, gravid, appropriate for gestational age.  Pain/Pressure: Present     Pelvic: Cervical exam deferred        Extremities: Normal range of motion.     Mental Status: Normal mood and affect. Normal behavior. Normal judgment and thought content.   Assessment and Plan:  Pregnancy: G2P1001 at [redacted]w[redacted]d  1. Dichorionic diamniotic twin pregnancy in third trimester Normal growth  2. Supervision of high risk pregnancy in third trimester Cold Sx, Sudafed  Preterm labor symptoms and general obstetric precautions including but not limited to vaginal  bleeding, contractions, leaking of fluid and fetal movement were reviewed in detail with the patient. Please refer to After Visit Summary for other counseling recommendations.  Return in about 1 week (around 07/14/2017).  Future Appointments  Date Time Provider Department Center  07/14/2017  8:15 AM Adam Phenix, MD [facility]  07/20/2017  1:35 PM Adam Phenix, MD [facility] W[facility]  07/28/2017  8:15 AM Ro[place] Bing, MD [facility]  07/28/2017  2:45 PM [facility] [place] 5 WH-MFCUS MFC-US  09/15/2017  1:00 PM Gearldine Bienenstock, PA-C PR-PR None    Scheryl Darter, MD

## 2017-07-07 NOTE — Patient Instructions (Signed)
Multiple Pregnancy Having a multiple pregnancy means that a woman is carrying more than one baby at a time. She may be pregnant with twins, triplets, or more. The majority of multiple pregnancies are twins. Naturally conceiving triplets or more (higher-order multiples) is rare. Multiple pregnancies are riskier than single pregnancies. A woman with a multiple pregnancy is more likely to have certain problems during her pregnancy. Therefore, she will need to have more frequent appointments for prenatal care. How does a multiple pregnancy happen? A multiple pregnancy happens when:  The woman's body releases more than one egg at a time, and then each egg gets fertilized by a different sperm. ? This is the most common type of multiple pregnancy. ? Twins or other multiples produced this way are fraternal. They are no more alike than non-multiple siblings are.  One sperm fertilizes one egg, which then divides into more than one embryo. ? Twins or other multiples produced this way are identical. Identical multiples are always the same gender, and they look very much alike.  Who is most likely to have a multiple pregnancy? A multiple pregnancy is more likely to develop in women who:  Have had fertility treatment, especially if the treatment included fertility drugs.  Are older than 31 years of age.  Have already had four or more children.  Have a family history of multiple pregnancy.  How is a multiple pregnancy diagnosed? A multiple pregnancy may be diagnosed based on:  Symptoms such as: ? Rapid weight gain in the first 3 months of pregnancy (first trimester). ? More severe nausea and breast tenderness than what is typical of a single pregnancy. ? The uterus measuring larger than what is normal for the stage of the pregnancy.  Blood tests that detect a higher-than-normal level of human chorionic gonadotropin (hCG). This is a hormone that your body produces in early pregnancy.  Ultrasound  exam. This is used to confirm that you are carrying multiples.  What risks are associated with multiple pregnancy? A multiple pregnancy puts you at a higher risk for certain problems during or after your pregnancy, including:  Having your babies delivered before you have reached a full-term pregnancy (preterm birth). A full-term pregnancy lasts for at least 37 weeks. Babies born before 53 weeks may have a higher risk of a variety of health problems, such as breathing problems, feeding difficulties, cerebral palsy, and learning disabilities.  Diabetes.  Preeclampsia. This is a serious condition that causes high blood pressure along with other symptoms, such as swelling and headaches, during pregnancy.  Excessive blood loss after childbirth (postpartum hemorrhage).  Postpartum depression.  Low birth weight of the babies.  How will having a multiple pregnancy affect my care? Your health care provider will want to monitor you more closely during your pregnancy to make sure that your babies are growing normally and that you are healthy. Follow these instructions at home: Because your pregnancy is considered to be high risk, you will need to work closely with your health care team. You may also need to make some lifestyle changes. These may include the following: Eating and drinking  Increase your nutrition. ? Follow your health care provider's recommendations for weight gain. You may need to gain a little extra weight when you are pregnant with multiples. ? Eat healthy snacks often throughout the day. This can add calories and reduce nausea.  Drink enough fluid to keep your urine clear or pale yellow.  Take prenatal vitamins. Activity By 20-24 weeks, you may  need to limit your activities.  Avoid activities and work that take a lot of effort (are strenuous).  Ask your health care provider when you should stop having sexual intercourse.  Rest often.  General instructions  Do not use  any products that contain nicotine or tobacco, such as cigarettes and e-cigarettes. If you need help quitting, ask your health care provider.  Do not drink alcohol or use illegal drugs.  Take over-the-counter and prescription medicines only as told by your health care provider.  Arrange for extra help around the house.  Keep all follow-up visits and all prenatal visits as told by your health care provider. This is important. Contact a health care provider if:  You have dizziness.  You have persistent nausea, vomiting, or diarrhea.  You are having trouble gaining weight.  You have feelings of depression or other emotions that are interfering with your normal activities. Get help right away if:  You have a fever.  You have pain with urination.  You have fluid leaking from your vagina.  You have a bad-smelling vaginal discharge.  You notice increased swelling in your face, hands, legs, or ankles.  You have spotting or bleeding from your vagina.  You have pelvic cramps, pelvic pressure, or nagging pain in your abdomen or lower back.  You are having regular contractions.  You develop a severe headache, with or without visual changes.  You have shortness of breath or chest pain.  You notice less fetal movement, or no fetal movement. This information is not intended to replace advice given to you by your health care provider. Make sure you discuss any questions you have with your health care provider. Document Released: 12/30/2007 Document Revised: 11/21/2015 Document Reviewed: 11/21/2015 Elsevier Interactive Patient Education  2018 [employer]  

## 2017-07-14 ENCOUNTER — Ambulatory Visit (INDEPENDENT_AMBULATORY_CARE_PROVIDER_SITE_OTHER): Payer: Managed Care, Other (non HMO) | Admitting: Obstetrics & Gynecology

## 2017-07-14 VITALS — BP 121/73 | HR 97 | Wt 307.3 lb

## 2017-07-14 DIAGNOSIS — O0993 Supervision of high risk pregnancy, unspecified, third trimester: Secondary | ICD-10-CM

## 2017-07-14 DIAGNOSIS — O30043 Twin pregnancy, dichorionic/diamniotic, third trimester: Secondary | ICD-10-CM

## 2017-07-14 NOTE — Progress Notes (Signed)
   PRENATAL VISIT NOTE  Subjective:  Lori Valentine is a 32 y.o. G2P1001 at [redacted]w[redacted]d being seen today for ongoing prenatal care.  She is currently monitored for the following issues for this high-risk pregnancy and has Hypothyroidism affecting pregnancy; Rheumatoid arthritis (HCC); History of anxiety and depression; Smoker; Supervision of high-risk pregnancy; Twin gestation, dichorionic diamniotic; Obesity in pregnancy, antepartum; and Maternal rheumatoid arthritis complicating pregnancy (HCC) on their problem list.  Patient reports no complaints.  Contractions: Not present. Vag. Bleeding: None.  Movement: Present. Denies leaking of fluid.   The following portions of the patient's history were reviewed and updated as appropriate: allergies, current medications, past family history, past medical history, past social history, past surgical history and problem list. Problem list updated.  Objective:   Vitals:   07/14/17 0820 07/14/17 0830  BP: (!) 113/42 121/73  Pulse: 96 97  Weight: (!) 307 lb 4.8 oz (139.4 kg)     Fetal Status: Fetal Heart Rate (bpm): 140/143   Movement: Present     General:  Alert, oriented and cooperative. Patient is in no acute distress.  Skin: Skin is warm and dry. No rash noted.   Cardiovascular: Normal heart rate noted  Respiratory: Normal respiratory effort, no problems with respiration noted  Abdomen: Soft, gravid, appropriate for gestational age.  Pain/Pressure: Present     Pelvic: Cervical exam deferred        Extremities: Normal range of motion.  Edema: Mild pitting, slight indentation  Mental Status: Normal mood and affect. Normal behavior. Normal judgment and thought content.   Assessment and Plan:  Pregnancy: G2P1001 at [redacted]w[redacted]d  1. Supervision of high risk pregnancy in third trimester Doing well with twins  2. Dichorionic diamniotic twin pregnancy in third trimester Adequate growth   Preterm labor symptoms and general obstetric precautions including but  not limited to vaginal bleeding, contractions, leaking of fluid and fetal movement were reviewed in detail with the patient. Please refer to After Visit Summary for other counseling recommendations.  Return in about 1 week (around 07/21/2017).  Future Appointments  Date Time Provider Department Center  07/20/2017  1:35 PM Adam Phenix, MD [facility]  07/28/2017  8:15 AM Sc[place] Bing, MD [facility] W[facility]  07/28/2017  2:45 PM [facility] [place] 5 WH-MFCUS MFC-US  09/15/2017  1:00 PM Gearldine Bienenstock, PA-C PR-PR None    Scheryl Darter, MD

## 2017-07-14 NOTE — Patient Instructions (Signed)
Multiple Pregnancy Having a multiple pregnancy means that a woman is carrying more than one baby at a time. She may be pregnant with twins, triplets, or more. The majority of multiple pregnancies are twins. Naturally conceiving triplets or more (higher-order multiples) is rare. Multiple pregnancies are riskier than single pregnancies. A woman with a multiple pregnancy is more likely to have certain problems during her pregnancy. Therefore, she will need to have more frequent appointments for prenatal care. How does a multiple pregnancy happen? A multiple pregnancy happens when:  The woman's body releases more than one egg at a time, and then each egg gets fertilized by a different sperm. ? This is the most common type of multiple pregnancy. ? Twins or other multiples produced this way are fraternal. They are no more alike than non-multiple siblings are.  One sperm fertilizes one egg, which then divides into more than one embryo. ? Twins or other multiples produced this way are identical. Identical multiples are always the same gender, and they look very much alike.  Who is most likely to have a multiple pregnancy? A multiple pregnancy is more likely to develop in women who:  Have had fertility treatment, especially if the treatment included fertility drugs.  Are older than 31 years of age.  Have already had four or more children.  Have a family history of multiple pregnancy.  How is a multiple pregnancy diagnosed? A multiple pregnancy may be diagnosed based on:  Symptoms such as: ? Rapid weight gain in the first 3 months of pregnancy (first trimester). ? More severe nausea and breast tenderness than what is typical of a single pregnancy. ? The uterus measuring larger than what is normal for the stage of the pregnancy.  Blood tests that detect a higher-than-normal level of human chorionic gonadotropin (hCG). This is a hormone that your body produces in early pregnancy.  Ultrasound  exam. This is used to confirm that you are carrying multiples.  What risks are associated with multiple pregnancy? A multiple pregnancy puts you at a higher risk for certain problems during or after your pregnancy, including:  Having your babies delivered before you have reached a full-term pregnancy (preterm birth). A full-term pregnancy lasts for at least 37 weeks. Babies born before 53 weeks may have a higher risk of a variety of health problems, such as breathing problems, feeding difficulties, cerebral palsy, and learning disabilities.  Diabetes.  Preeclampsia. This is a serious condition that causes high blood pressure along with other symptoms, such as swelling and headaches, during pregnancy.  Excessive blood loss after childbirth (postpartum hemorrhage).  Postpartum depression.  Low birth weight of the babies.  How will having a multiple pregnancy affect my care? Your health care provider will want to monitor you more closely during your pregnancy to make sure that your babies are growing normally and that you are healthy. Follow these instructions at home: Because your pregnancy is considered to be high risk, you will need to work closely with your health care team. You may also need to make some lifestyle changes. These may include the following: Eating and drinking  Increase your nutrition. ? Follow your health care provider's recommendations for weight gain. You may need to gain a little extra weight when you are pregnant with multiples. ? Eat healthy snacks often throughout the day. This can add calories and reduce nausea.  Drink enough fluid to keep your urine clear or pale yellow.  Take prenatal vitamins. Activity By 20-24 weeks, you may  need to limit your activities.  Avoid activities and work that take a lot of effort (are strenuous).  Ask your health care provider when you should stop having sexual intercourse.  Rest often.  General instructions  Do not use  any products that contain nicotine or tobacco, such as cigarettes and e-cigarettes. If you need help quitting, ask your health care provider.  Do not drink alcohol or use illegal drugs.  Take over-the-counter and prescription medicines only as told by your health care provider.  Arrange for extra help around the house.  Keep all follow-up visits and all prenatal visits as told by your health care provider. This is important. Contact a health care provider if:  You have dizziness.  You have persistent nausea, vomiting, or diarrhea.  You are having trouble gaining weight.  You have feelings of depression or other emotions that are interfering with your normal activities. Get help right away if:  You have a fever.  You have pain with urination.  You have fluid leaking from your vagina.  You have a bad-smelling vaginal discharge.  You notice increased swelling in your face, hands, legs, or ankles.  You have spotting or bleeding from your vagina.  You have pelvic cramps, pelvic pressure, or nagging pain in your abdomen or lower back.  You are having regular contractions.  You develop a severe headache, with or without visual changes.  You have shortness of breath or chest pain.  You notice less fetal movement, or no fetal movement. This information is not intended to replace advice given to you by your health care provider. Make sure you discuss any questions you have with your health care provider. Document Released: 12/30/2007 Document Revised: 11/21/2015 Document Reviewed: 11/21/2015 Elsevier Interactive Patient Education  2018 [employer]  

## 2017-07-17 ENCOUNTER — Other Ambulatory Visit: Payer: Self-pay | Admitting: Family Medicine

## 2017-07-17 MED ORDER — LEVOTHYROXINE SODIUM 175 MCG PO TABS: 175.0000 ug | ORAL_TABLET | Freq: Every day | ORAL | 1 refills | Status: DC

## 2017-07-20 ENCOUNTER — Ambulatory Visit (INDEPENDENT_AMBULATORY_CARE_PROVIDER_SITE_OTHER): Payer: Managed Care, Other (non HMO) | Admitting: Obstetrics & Gynecology

## 2017-07-20 VITALS — BP 118/70 | HR 108 | Wt 314.3 lb

## 2017-07-20 DIAGNOSIS — O0993 Supervision of high risk pregnancy, unspecified, third trimester: Secondary | ICD-10-CM

## 2017-07-20 DIAGNOSIS — O30043 Twin pregnancy, dichorionic/diamniotic, third trimester: Secondary | ICD-10-CM

## 2017-07-20 NOTE — Progress Notes (Signed)
Pt states is concerned of leakage, started last night..clear/white

## 2017-07-20 NOTE — Patient Instructions (Signed)
Multiple Pregnancy Having a multiple pregnancy means that a woman is carrying more than one baby at a time. She may be pregnant with twins, triplets, or more. The majority of multiple pregnancies are twins. Naturally conceiving triplets or more (higher-order multiples) is rare. Multiple pregnancies are riskier than single pregnancies. A woman with a multiple pregnancy is more likely to have certain problems during her pregnancy. Therefore, she will need to have more frequent appointments for prenatal care. How does a multiple pregnancy happen? A multiple pregnancy happens when:  The woman's body releases more than one egg at a time, and then each egg gets fertilized by a different sperm. ? This is the most common type of multiple pregnancy. ? Twins or other multiples produced this way are fraternal. They are no more alike than non-multiple siblings are.  One sperm fertilizes one egg, which then divides into more than one embryo. ? Twins or other multiples produced this way are identical. Identical multiples are always the same gender, and they look very much alike.  Who is most likely to have a multiple pregnancy? A multiple pregnancy is more likely to develop in women who:  Have had fertility treatment, especially if the treatment included fertility drugs.  Are older than 31 years of age.  Have already had four or more children.  Have a family history of multiple pregnancy.  How is a multiple pregnancy diagnosed? A multiple pregnancy may be diagnosed based on:  Symptoms such as: ? Rapid weight gain in the first 3 months of pregnancy (first trimester). ? More severe nausea and breast tenderness than what is typical of a single pregnancy. ? The uterus measuring larger than what is normal for the stage of the pregnancy.  Blood tests that detect a higher-than-normal level of human chorionic gonadotropin (hCG). This is a hormone that your body produces in early pregnancy.  Ultrasound  exam. This is used to confirm that you are carrying multiples.  What risks are associated with multiple pregnancy? A multiple pregnancy puts you at a higher risk for certain problems during or after your pregnancy, including:  Having your babies delivered before you have reached a full-term pregnancy (preterm birth). A full-term pregnancy lasts for at least 37 weeks. Babies born before 53 weeks may have a higher risk of a variety of health problems, such as breathing problems, feeding difficulties, cerebral palsy, and learning disabilities.  Diabetes.  Preeclampsia. This is a serious condition that causes high blood pressure along with other symptoms, such as swelling and headaches, during pregnancy.  Excessive blood loss after childbirth (postpartum hemorrhage).  Postpartum depression.  Low birth weight of the babies.  How will having a multiple pregnancy affect my care? Your health care provider will want to monitor you more closely during your pregnancy to make sure that your babies are growing normally and that you are healthy. Follow these instructions at home: Because your pregnancy is considered to be high risk, you will need to work closely with your health care team. You may also need to make some lifestyle changes. These may include the following: Eating and drinking  Increase your nutrition. ? Follow your health care provider's recommendations for weight gain. You may need to gain a little extra weight when you are pregnant with multiples. ? Eat healthy snacks often throughout the day. This can add calories and reduce nausea.  Drink enough fluid to keep your urine clear or pale yellow.  Take prenatal vitamins. Activity By 20-24 weeks, you may  need to limit your activities.  Avoid activities and work that take a lot of effort (are strenuous).  Ask your health care provider when you should stop having sexual intercourse.  Rest often.  General instructions  Do not use  any products that contain nicotine or tobacco, such as cigarettes and e-cigarettes. If you need help quitting, ask your health care provider.  Do not drink alcohol or use illegal drugs.  Take over-the-counter and prescription medicines only as told by your health care provider.  Arrange for extra help around the house.  Keep all follow-up visits and all prenatal visits as told by your health care provider. This is important. Contact a health care provider if:  You have dizziness.  You have persistent nausea, vomiting, or diarrhea.  You are having trouble gaining weight.  You have feelings of depression or other emotions that are interfering with your normal activities. Get help right away if:  You have a fever.  You have pain with urination.  You have fluid leaking from your vagina.  You have a bad-smelling vaginal discharge.  You notice increased swelling in your face, hands, legs, or ankles.  You have spotting or bleeding from your vagina.  You have pelvic cramps, pelvic pressure, or nagging pain in your abdomen or lower back.  You are having regular contractions.  You develop a severe headache, with or without visual changes.  You have shortness of breath or chest pain.  You notice less fetal movement, or no fetal movement. This information is not intended to replace advice given to you by your health care provider. Make sure you discuss any questions you have with your health care provider. Document Released: 12/30/2007 Document Revised: 11/21/2015 Document Reviewed: 11/21/2015 Elsevier Interactive Patient Education  [employer].

## 2017-07-22 ENCOUNTER — Encounter: Payer: Managed Care, Other (non HMO) | Admitting: Family Medicine

## 2017-07-28 ENCOUNTER — Other Ambulatory Visit (HOSPITAL_COMMUNITY)
Admission: RE | Admit: 2017-07-28 | Discharge: 2017-07-28 | Disposition: A | Discharge status: Home / Self Care | Payer: Managed Care, Other (non HMO) | Source: Ambulatory Visit | Attending: Obstetrics and Gynecology | Admitting: Obstetrics and Gynecology

## 2017-07-28 ENCOUNTER — Ambulatory Visit (INDEPENDENT_AMBULATORY_CARE_PROVIDER_SITE_OTHER): Payer: Managed Care, Other (non HMO) | Admitting: Obstetrics and Gynecology

## 2017-07-28 ENCOUNTER — Telehealth: Payer: Self-pay | Admitting: Rheumatology

## 2017-07-28 ENCOUNTER — Other Ambulatory Visit (HOSPITAL_COMMUNITY): Payer: Self-pay | Admitting: Obstetrics and Gynecology

## 2017-07-28 ENCOUNTER — Encounter: Payer: Self-pay | Admitting: *Deleted

## 2017-07-28 ENCOUNTER — Ambulatory Visit (HOSPITAL_COMMUNITY)
Admission: RE | Admit: 2017-07-28 | Discharge: 2017-07-28 | Disposition: A | Discharge status: Home / Self Care | Payer: Managed Care, Other (non HMO) | Source: Ambulatory Visit | Attending: Obstetrics & Gynecology | Admitting: Obstetrics & Gynecology

## 2017-07-28 ENCOUNTER — Encounter (HOSPITAL_COMMUNITY): Payer: Self-pay

## 2017-07-28 ENCOUNTER — Encounter: Payer: Self-pay | Admitting: Obstetrics and Gynecology

## 2017-07-28 VITALS — BP 127/63 | HR 91 | Wt 318.0 lb

## 2017-07-28 DIAGNOSIS — O9989 Other specified diseases and conditions complicating pregnancy, childbirth and the puerperium: Secondary | ICD-10-CM

## 2017-07-28 DIAGNOSIS — O30043 Twin pregnancy, dichorionic/diamniotic, third trimester: Secondary | ICD-10-CM | POA: Diagnosis not present

## 2017-07-28 DIAGNOSIS — M069 Rheumatoid arthritis, unspecified: Secondary | ICD-10-CM

## 2017-07-28 DIAGNOSIS — O0993 Supervision of high risk pregnancy, unspecified, third trimester: Secondary | ICD-10-CM | POA: Insufficient documentation

## 2017-07-28 DIAGNOSIS — E039 Hypothyroidism, unspecified: Secondary | ICD-10-CM

## 2017-07-28 DIAGNOSIS — O99283 Endocrine, nutritional and metabolic diseases complicating pregnancy, third trimester: Secondary | ICD-10-CM

## 2017-07-28 DIAGNOSIS — O368121 Decreased fetal movements, second trimester, fetus 1: Secondary | ICD-10-CM | POA: Diagnosis not present

## 2017-07-28 DIAGNOSIS — O368122 Decreased fetal movements, second trimester, fetus 2: Secondary | ICD-10-CM

## 2017-07-28 DIAGNOSIS — Z3A35 35 weeks gestation of pregnancy: Secondary | ICD-10-CM | POA: Insufficient documentation

## 2017-07-28 DIAGNOSIS — Z362 Encounter for other antenatal screening follow-up: Secondary | ICD-10-CM

## 2017-07-28 DIAGNOSIS — Z6841 Body Mass Index (BMI) 40.0 and over, adult: Secondary | ICD-10-CM

## 2017-07-28 DIAGNOSIS — O99019 Anemia complicating pregnancy, unspecified trimester: Secondary | ICD-10-CM

## 2017-07-28 DIAGNOSIS — O99891 Other specified diseases and conditions complicating pregnancy: Secondary | ICD-10-CM

## 2017-07-28 DIAGNOSIS — O99210 Obesity complicating pregnancy, unspecified trimester: Secondary | ICD-10-CM

## 2017-07-28 LAB — OB RESULTS CONSOLE GC/CHLAMYDIA: GC PROBE AMP, GENITAL: NEGATIVE

## 2017-07-28 NOTE — Telephone Encounter (Signed)
I received a phone call from the patient.  She stated that she is having a lot of discomfort and pain in her left shoulder.  She went to see her OB.  She was advised to call us about the prednisone taper.  She is waiting in the office and she has not seen the physician yet.  I have advised her to ask them to give her prednisone taper for the dose that think will be appropriate during pregnancy.  If that is not an option we can give her a cortisone injection to her left shoulder tomorrow morning.  She will also need permission from her OB regarding getting a cortisone injection to her shoulder joint.  Patient will call us tomorrow morning. Pollyann Savoy, MD

## 2017-07-28 NOTE — Progress Notes (Signed)
Prenatal Visit Note Date: 07/28/2017 Clinic: [facility]  Subjective:  Lori Valentine is a 32 y.o. G2P1001 at [redacted]w[redacted]d being seen today for ongoing prenatal care.  She is currently monitored for the following issues for this high-risk pregnancy and has Hypothyroidism affecting pregnancy; Rheumatoid arthritis (HCC); History of anxiety and depression; Smoker; Supervision of high-risk pregnancy; Twin gestation, dichorionic diamniotic; Obesity in pregnancy, antepartum; Maternal rheumatoid arthritis complicating pregnancy (HCC); and BMI 50.0-59.9, adult (HCC) on their problem list.  Patient reports decreased FM for past few days. pt states that she called the office but was on hold for awhile so hung up. also thinks she's been having the start of a flare for the past few days. she hasn't talked to her rheum provider about this..   Contractions: Irritability. Vag. Bleeding: None.  Movement: (!) Decreased. Denies leaking of fluid.   The following portions of the patient's history were reviewed and updated as appropriate: allergies, current medications, past family history, past medical history, past social history, past surgical history and problem list. Problem list updated.  Objective:   Vitals:   07/28/17 0821  BP: 127/63  Pulse: 91  Weight: (!) 318 lb (144.2 kg)    Fetal Status: Fetal Heart Rate (bpm): 143/154   Movement: (!) Decreased     General:  Alert, oriented and cooperative. Patient is in no acute distress.  Skin: Skin is warm and dry. No rash noted.   Cardiovascular: Normal heart rate noted  Respiratory: Normal respiratory effort, no problems with respiration noted  Abdomen: Soft, gravid, appropriate for gestational age. Pain/Pressure: Present     Pelvic:  Cervical exam deferred        Extremities: Normal range of motion.  Edema: Mild pitting, slight indentation  Mental Status: Normal mood and affect. Normal behavior. Normal judgment and thought content.    Urinalysis:      Assessment and Plan:  Pregnancy: G2P1001 at [redacted]w[redacted]d  1. Decreased fetal movement affecting management of pregnancy in second trimester, fetus 1 of multiple gestation NST today. Pt told if cant get a hold of anyone re: any fetal concerns to come to the hospital for eval  2. Decreased fetal movement affecting management of pregnancy in second trimester, fetus 2 of multiple gestation See above  3. Hypothyroidism affecting pregnancy in third trimester Continue synthroid 175. TSH today - TSH  4. Maternal rheumatoid arthritis complicating pregnancy (HCC) Pt on certolizumab. Pt told to call rheum provider today to let them know s/s and what to do next. Pt states she is due for another infusion  5. Dichorionic diamniotic twin pregnancy in third trimester F/u growth today. Delivery planning d/w her. I told her if first is non cephalic then recommend c/s but if first is ceph r/b/a of trying for VD with non cephalic fetus b and with >20% discordance d/w her. I also told her that anyone with multiples can elect for a primary c-section for that indication alone. Pt is undecided as to what she wants to do and I said that we'll f/u her u/s today and d/w her more next week re: this.   6. Obesity in pregnancy, antepartum  7. BMI 50.0-59.9, adult (HCC)   8. Supervision of high risk pregnancy in third trimester BTL papers already signed. GBS today.  - TSH - CBC - Cervicovaginal ancillary only - Strep Gp B NAA  9. Anemia affecting pregnancy, antepartum Rpt cbc today. On qday iron - CBC  Preterm labor symptoms and general obstetric precautions including  but not limited to vaginal bleeding, contractions, leaking of fluid and fetal movement were reviewed in detail with the patient. Please refer to After Visit Summary for other counseling recommendations.  Return in about 1 week (around 08/04/2017) for hrob.   Ceiba Bing, MD

## 2017-07-29 ENCOUNTER — Encounter: Payer: Self-pay | Admitting: Obstetrics and Gynecology

## 2017-07-29 ENCOUNTER — Other Ambulatory Visit: Payer: Self-pay | Admitting: General Practice

## 2017-07-29 LAB — CBC
Hematocrit: 27.1 % — ABNORMAL LOW (ref 34.0–46.6)
Hemoglobin: 8.6 g/dL — ABNORMAL LOW (ref 11.1–15.9)
MCH: 24 pg — ABNORMAL LOW (ref 26.6–33.0)
MCHC: 31.7 g/dL (ref 31.5–35.7)
MCV: 76 fL — ABNORMAL LOW (ref 79–97)
PLATELETS: 168 10*3/uL (ref 150–379)
RBC: 3.59 x10E6/uL — ABNORMAL LOW (ref 3.77–5.28)
RDW: 16.9 % — AB (ref 12.3–15.4)
WBC: 7.2 10*3/uL (ref 3.4–10.8)

## 2017-07-29 LAB — TSH: TSH: 7.25 u[IU]/mL — AB (ref 0.450–4.500)

## 2017-07-29 MED ORDER — LEVOTHYROXINE SODIUM 50 MCG PO TABS: ORAL_TABLET | ORAL | 1 refills | Status: DC

## 2017-07-29 NOTE — Addendum Note (Signed)
Addended by: Andrew Bing on: 07/29/2017 08:19 AM   Modules accepted: Orders

## 2017-07-30 LAB — STREP GP B NAA: STREP GROUP B AG: NEGATIVE

## 2017-08-01 ENCOUNTER — Encounter: Payer: Self-pay | Admitting: Obstetrics and Gynecology

## 2017-08-01 DIAGNOSIS — O99019 Anemia complicating pregnancy, unspecified trimester: Secondary | ICD-10-CM | POA: Insufficient documentation

## 2017-08-01 LAB — CERVICOVAGINAL ANCILLARY ONLY
Chlamydia: NEGATIVE
Neisseria Gonorrhea: NEGATIVE
Trichomonas: NEGATIVE

## 2017-08-03 ENCOUNTER — Encounter: Payer: Self-pay | Admitting: Advanced Practice Midwife

## 2017-08-03 ENCOUNTER — Ambulatory Visit (INDEPENDENT_AMBULATORY_CARE_PROVIDER_SITE_OTHER): Payer: Managed Care, Other (non HMO) | Admitting: Advanced Practice Midwife

## 2017-08-03 VITALS — BP 130/73 | HR 84 | Wt 324.7 lb

## 2017-08-03 DIAGNOSIS — O0993 Supervision of high risk pregnancy, unspecified, third trimester: Secondary | ICD-10-CM

## 2017-08-03 MED ORDER — METHYLPREDNISOLONE 4 MG PO TBPK: ORAL_TABLET | ORAL | 0 refills | Status: DC

## 2017-08-03 NOTE — Progress Notes (Signed)
Pt states can not hear in right ear for over a week, also has sinus issues. Serious swelling in feet.

## 2017-08-03 NOTE — Progress Notes (Signed)
   PRENATAL VISIT NOTE  Subjective:  Lori Valentine is a 32 y.o. G2P1001 at [redacted]w[redacted]d being seen today for ongoing prenatal care.  She is currently monitored for the following issues for this low-risk pregnancy and has Hypothyroidism affecting pregnancy; Rheumatoid arthritis (HCC); History of anxiety and depression; Smoker; Supervision of high-risk pregnancy; Twin gestation, dichorionic diamniotic; Obesity in pregnancy, antepartum; Maternal rheumatoid arthritis complicating pregnancy (HCC); BMI 50.0-59.9, adult (HCC); and Anemia affecting pregnancy on their problem list.  Patient reports no complaints.  Contractions: Irritability. Vag. Bleeding: None.  Movement: Present. Denies leaking of fluid.   The following portions of the patient's history were reviewed and updated as appropriate: allergies, current medications, past family history, past medical history, past social history, past surgical history and problem list. Problem list updated.  Objective:   Vitals:   08/03/17 1447  BP: 130/73  Pulse: 84  Weight: (!) 324 lb 11.2 oz (147.3 kg)    Fetal Status: Fetal Heart Rate (bpm): 140/134   Movement: Present     General:  Alert, oriented and cooperative. Patient is in no acute distress.  Skin: Skin is warm and dry. No rash noted.   Cardiovascular: Normal heart rate noted  Respiratory: Normal respiratory effort, no problems with respiration noted  Abdomen: Soft, gravid, appropriate for gestational age.  Pain/Pressure: Present     Pelvic: Cervical exam deferred        Extremities: Normal range of motion.  Edema: Moderate pitting, indentation subsides rapidly  Mental Status: Normal mood and affect. Normal behavior. Normal judgment and thought content.   Consult with Dr. Marice Potter, and she reviewed most recent MFM [place]. Ok to continue routine care  Assessment and Plan:  Pregnancy: G2P1001 at [redacted]w[redacted]d  1. Supervision of high risk pregnancy in third trimester  - Anemia panel - Patient is taking 225 mcg  of synthroid  - MFM recommendation for delivery by 38 weeks - Will plan IOL at [place]  - Patient interested in vaginal delivery - Medrol dose pack ordered as per note from Rheumatology dated 07/28/17    Preterm labor symptoms and general obstetric precautions including but not limited to vaginal bleeding, contractions, leaking of fluid and fetal movement were reviewed in detail with the patient. Please refer to After Visit Summary for other counseling recommendations.  Return in about 1 week (around 08/10/2017).  Future Appointments  Date Time Provider Department Center  08/08/2017  3:15 PM [name] [facility]  09/15/2017  1:00 PM Gearldine Bienenstock, PA-C [facility] None    Thressa Sheller, CNM

## 2017-08-04 ENCOUNTER — Inpatient Hospital Stay (HOSPITAL_COMMUNITY)
Admission: AD | Admit: 2017-08-04 | Discharge: 2017-08-07 | DRG: 783 | Disposition: A | Discharge status: Home / Self Care | Payer: Managed Care, Other (non HMO) | Source: Ambulatory Visit | Attending: Obstetrics and Gynecology | Admitting: Obstetrics and Gynecology

## 2017-08-04 ENCOUNTER — Inpatient Hospital Stay (HOSPITAL_COMMUNITY): Payer: Managed Care, Other (non HMO) | Admitting: Anesthesiology

## 2017-08-04 ENCOUNTER — Encounter (HOSPITAL_COMMUNITY)
Admission: AD | Disposition: A | Discharge status: Home / Self Care | Payer: Self-pay | Source: Ambulatory Visit | Attending: Obstetrics and Gynecology

## 2017-08-04 ENCOUNTER — Encounter (HOSPITAL_COMMUNITY): Payer: Self-pay

## 2017-08-04 DIAGNOSIS — Z87891 Personal history of nicotine dependence: Secondary | ICD-10-CM

## 2017-08-04 DIAGNOSIS — O42913 Preterm premature rupture of membranes, unspecified as to length of time between rupture and onset of labor, third trimester: Secondary | ICD-10-CM | POA: Diagnosis present

## 2017-08-04 DIAGNOSIS — O9989 Other specified diseases and conditions complicating pregnancy, childbirth and the puerperium: Secondary | ICD-10-CM

## 2017-08-04 DIAGNOSIS — O30043 Twin pregnancy, dichorionic/diamniotic, third trimester: Secondary | ICD-10-CM

## 2017-08-04 DIAGNOSIS — O99284 Endocrine, nutritional and metabolic diseases complicating childbirth: Secondary | ICD-10-CM | POA: Diagnosis present

## 2017-08-04 DIAGNOSIS — D649 Anemia, unspecified: Secondary | ICD-10-CM | POA: Diagnosis present

## 2017-08-04 DIAGNOSIS — M069 Rheumatoid arthritis, unspecified: Secondary | ICD-10-CM

## 2017-08-04 DIAGNOSIS — Z3A36 36 weeks gestation of pregnancy: Secondary | ICD-10-CM

## 2017-08-04 DIAGNOSIS — O0990 Supervision of high risk pregnancy, unspecified, unspecified trimester: Secondary | ICD-10-CM

## 2017-08-04 DIAGNOSIS — E039 Hypothyroidism, unspecified: Secondary | ICD-10-CM | POA: Diagnosis present

## 2017-08-04 DIAGNOSIS — O9902 Anemia complicating childbirth: Secondary | ICD-10-CM | POA: Diagnosis present

## 2017-08-04 DIAGNOSIS — O0993 Supervision of high risk pregnancy, unspecified, third trimester: Secondary | ICD-10-CM

## 2017-08-04 DIAGNOSIS — O30049 Twin pregnancy, dichorionic/diamniotic, unspecified trimester: Secondary | ICD-10-CM | POA: Diagnosis present

## 2017-08-04 DIAGNOSIS — O99019 Anemia complicating pregnancy, unspecified trimester: Secondary | ICD-10-CM | POA: Diagnosis present

## 2017-08-04 DIAGNOSIS — Z302 Encounter for sterilization: Secondary | ICD-10-CM

## 2017-08-04 DIAGNOSIS — O99210 Obesity complicating pregnancy, unspecified trimester: Secondary | ICD-10-CM | POA: Diagnosis present

## 2017-08-04 DIAGNOSIS — O1205 Gestational edema, complicating the puerperium: Secondary | ICD-10-CM | POA: Diagnosis not present

## 2017-08-04 DIAGNOSIS — Z8659 Personal history of other mental and behavioral disorders: Secondary | ICD-10-CM

## 2017-08-04 DIAGNOSIS — O42013 Preterm premature rupture of membranes, onset of labor within 24 hours of rupture, third trimester: Secondary | ICD-10-CM

## 2017-08-04 DIAGNOSIS — O99214 Obesity complicating childbirth: Secondary | ICD-10-CM | POA: Diagnosis present

## 2017-08-04 DIAGNOSIS — O99891 Other specified diseases and conditions complicating pregnancy: Secondary | ICD-10-CM

## 2017-08-04 HISTORY — PX: TUBAL LIGATION: SHX77

## 2017-08-04 LAB — ABO/RH: ABO/RH(D): A POS

## 2017-08-04 LAB — CBC
HCT: 28.3 % — ABNORMAL LOW (ref 36.0–46.0)
HCT: 26.9 % — ABNORMAL LOW (ref 36.0–46.0)
HEMOGLOBIN: 9 g/dL — AB (ref 12.0–15.0)
Hemoglobin: 8.3 g/dL — ABNORMAL LOW (ref 12.0–15.0)
MCH: 24 pg — AB (ref 26.0–34.0)
MCH: 23.6 pg — ABNORMAL LOW (ref 26.0–34.0)
MCHC: 31.8 g/dL (ref 30.0–36.0)
MCHC: 30.9 g/dL (ref 30.0–36.0)
MCV: 75.5 fL — ABNORMAL LOW (ref 78.0–100.0)
MCV: 76.6 fL — ABNORMAL LOW (ref 78.0–100.0)
Platelets: 238 10*3/uL (ref 150–400)
Platelets: 215 K/uL (ref 150–400)
RBC: 3.75 MIL/uL — AB (ref 3.87–5.11)
RBC: 3.51 MIL/uL — ABNORMAL LOW (ref 3.87–5.11)
RDW: 15.9 % — ABNORMAL HIGH (ref 11.5–15.5)
RDW: 16 % — ABNORMAL HIGH (ref 11.5–15.5)
WBC: 10 10*3/uL (ref 4.0–10.5)
WBC: 16.7 K/uL — ABNORMAL HIGH (ref 4.0–10.5)

## 2017-08-04 LAB — PROTEIN / CREATININE RATIO, URINE
Creatinine, Urine: 84 mg/dL
PROTEIN CREATININE RATIO: 0.23 mg/mg{creat} — AB (ref 0.00–0.15)
TOTAL PROTEIN, URINE: 19 mg/dL

## 2017-08-04 LAB — ANEMIA PANEL
FOLATE, HEMOLYSATE: 432.8 ng/mL
Ferritin: 10 ng/mL — ABNORMAL LOW (ref 15–150)
Folate, RBC: 1609 ng/mL (ref >498)
Hematocrit: 26.9 % — ABNORMAL LOW (ref 34.0–46.6)
IRON: 46 ug/dL (ref 27–159)
Iron Saturation: 7 % — CL (ref 15–55)
Retic Ct Pct: 3.3 % — ABNORMAL HIGH (ref 0.6–2.6)
Total Iron Binding Capacity: 619 ug/dL (ref 250–450)
UIBC: 573 ug/dL — AB (ref 131–425)
Vitamin B-12: 161 pg/mL — ABNORMAL LOW (ref 232–1245)

## 2017-08-04 LAB — COMPREHENSIVE METABOLIC PANEL
ALT: 12 U/L — AB (ref 14–54)
ANION GAP: 10 (ref 5–15)
AST: 18 U/L (ref 15–41)
Albumin: 2.5 g/dL — ABNORMAL LOW (ref 3.5–5.0)
Alkaline Phosphatase: 253 U/L — ABNORMAL HIGH (ref 38–126)
BUN: 7 mg/dL (ref 6–20)
CALCIUM: 8.6 mg/dL — AB (ref 8.9–10.3)
CHLORIDE: 107 mmol/L (ref 101–111)
CO2: 20 mmol/L — ABNORMAL LOW (ref 22–32)
CREATININE: 0.48 mg/dL (ref 0.44–1.00)
Glucose, Bld: 101 mg/dL — ABNORMAL HIGH (ref 65–99)
Potassium: 4.3 mmol/L (ref 3.5–5.1)
SODIUM: 137 mmol/L (ref 135–145)
Total Bilirubin: 0.3 mg/dL (ref 0.3–1.2)
Total Protein: 6.1 g/dL — ABNORMAL LOW (ref 6.5–8.1)

## 2017-08-04 LAB — CREATININE, SERUM
Creatinine, Ser: 0.54 mg/dL (ref 0.44–1.00)
GFR calc Af Amer: >60 mL/min (ref >60)
GFR calc non Af Amer: >60 mL/min (ref >60)

## 2017-08-04 LAB — POCT FERN TEST: POCT FERN TEST: POSITIVE

## 2017-08-04 LAB — RPR: RPR: NONREACTIVE

## 2017-08-04 SURGERY — Surgical Case
Anesthesia: Epidural | Site: Abdomen | Wound class: Clean Contaminated

## 2017-08-04 MED ORDER — PROPOFOL 10 MG/ML IV BOLUS
INTRAVENOUS | Status: AC
  Filled 2017-08-04: qty 20

## 2017-08-04 MED ORDER — LIDOCAINE HCL (PF) 1 % IJ SOLN: 30.0000 mL | INTRAMUSCULAR | Status: DC | PRN

## 2017-08-04 MED ORDER — SUCCINYLCHOLINE 20MG/ML (10ML) SYRINGE FOR MEDFUSION PUMP - OPTIME
INTRAMUSCULAR | Status: DC | PRN
  Administered 2017-08-04: 140 mg via INTRAVENOUS

## 2017-08-04 MED ORDER — OXYCODONE HCL 5 MG PO TABS
10.0000 mg | ORAL_TABLET | ORAL | Status: DC | PRN
  Administered 2017-08-04 – 2017-08-06 (×8): 10 mg via ORAL
  Filled 2017-08-04 (×11): qty 2

## 2017-08-04 MED ORDER — SCOPOLAMINE 1 MG/3DAYS TD PT72
MEDICATED_PATCH | TRANSDERMAL | Status: AC
  Filled 2017-08-04: qty 1

## 2017-08-04 MED ORDER — SCOPOLAMINE 1 MG/3DAYS TD PT72
MEDICATED_PATCH | TRANSDERMAL | Status: DC | PRN
  Administered 2017-08-04: 1 via TRANSDERMAL

## 2017-08-04 MED ORDER — KETOROLAC TROMETHAMINE 30 MG/ML IJ SOLN
INTRAMUSCULAR | Status: DC | PRN
  Administered 2017-08-04: 30 mg via INTRAVENOUS

## 2017-08-04 MED ORDER — WITCH HAZEL-GLYCERIN EX PADS: 1.0000 "application " | MEDICATED_PAD | CUTANEOUS | Status: DC | PRN

## 2017-08-04 MED ORDER — NALOXONE HCL 4 MG/10ML IJ SOLN: 1.0000 ug/kg/h | INTRAVENOUS | Status: DC | PRN

## 2017-08-04 MED ORDER — PRENATAL MULTIVITAMIN CH
1.0000 | ORAL_TABLET | Freq: Every day | ORAL | Status: DC
  Administered 2017-08-05 – 2017-08-06 (×2): 1 via ORAL
  Filled 2017-08-04 (×2): qty 1

## 2017-08-04 MED ORDER — SODIUM CHLORIDE 0.9% FLUSH
3.0000 mL | INTRAVENOUS | Status: DC | PRN
Start: 2017-08-04 — End: 2017-08-07

## 2017-08-04 MED ORDER — SIMETHICONE 80 MG PO CHEW
80.0000 mg | CHEWABLE_TABLET | ORAL | Status: DC
  Administered 2017-08-04 – 2017-08-06 (×3): 80 mg via ORAL
  Filled 2017-08-04 (×3): qty 1

## 2017-08-04 MED ORDER — FENTANYL 2.5 MCG/ML BUPIVACAINE 1/10 % EPIDURAL INFUSION (WH - ANES)
14.0000 mL/h | INTRAMUSCULAR | Status: DC | PRN
  Administered 2017-08-04: 14 mL/h via EPIDURAL
  Filled 2017-08-04: qty 100

## 2017-08-04 MED ORDER — ACETAMINOPHEN 325 MG PO TABS: 650.0000 mg | ORAL_TABLET | ORAL | Status: DC | PRN

## 2017-08-04 MED ORDER — NALBUPHINE HCL 10 MG/ML IJ SOLN: 5.0000 mg | INTRAMUSCULAR | Status: DC | PRN

## 2017-08-04 MED ORDER — CEFAZOLIN SODIUM-DEXTROSE 2-4 GM/100ML-% IV SOLN
INTRAVENOUS | Status: AC
Start: 2017-08-04 — End: 2017-08-04
  Filled 2017-08-04: qty 100

## 2017-08-04 MED ORDER — SUCCINYLCHOLINE CHLORIDE 200 MG/10ML IV SOSY
PREFILLED_SYRINGE | INTRAVENOUS | Status: AC
  Filled 2017-08-04: qty 10

## 2017-08-04 MED ORDER — MEPERIDINE HCL 25 MG/ML IJ SOLN: 6.2500 mg | INTRAMUSCULAR | Status: DC | PRN

## 2017-08-04 MED ORDER — PHENYLEPHRINE 40 MCG/ML (10ML) SYRINGE FOR IV PUSH (FOR BLOOD PRESSURE SUPPORT)
80.0000 ug | PREFILLED_SYRINGE | INTRAVENOUS | Status: DC | PRN
  Filled 2017-08-04: qty 10

## 2017-08-04 MED ORDER — SIMETHICONE 80 MG PO CHEW: 80.0000 mg | CHEWABLE_TABLET | ORAL | Status: DC | PRN

## 2017-08-04 MED ORDER — EPHEDRINE 5 MG/ML INJ: 10.0000 mg | INTRAVENOUS | Status: DC | PRN

## 2017-08-04 MED ORDER — FENTANYL CITRATE (PF) 100 MCG/2ML IJ SOLN
100.0000 ug | INTRAMUSCULAR | Status: DC | PRN
  Administered 2017-08-04 (×2): 100 ug via INTRAVENOUS
  Filled 2017-08-04 (×2): qty 2

## 2017-08-04 MED ORDER — DIPHENHYDRAMINE HCL 25 MG PO CAPS: 25.0000 mg | ORAL_CAPSULE | Freq: Four times a day (QID) | ORAL | Status: DC | PRN

## 2017-08-04 MED ORDER — DEXAMETHASONE SODIUM PHOSPHATE 10 MG/ML IJ SOLN
INTRAMUSCULAR | Status: AC
  Filled 2017-08-04: qty 1

## 2017-08-04 MED ORDER — NALBUPHINE HCL 10 MG/ML IJ SOLN: 5.0000 mg | Freq: Once | INTRAMUSCULAR | Status: DC | PRN

## 2017-08-04 MED ORDER — ACETAMINOPHEN 10 MG/ML IV SOLN
INTRAVENOUS | Status: AC
  Filled 2017-08-04: qty 100

## 2017-08-04 MED ORDER — TETANUS-DIPHTH-ACELL PERTUSSIS 5-2.5-18.5 LF-MCG/0.5 IM SUSP: 0.5000 mL | Freq: Once | INTRAMUSCULAR | Status: DC

## 2017-08-04 MED ORDER — OXYCODONE-ACETAMINOPHEN 5-325 MG PO TABS: 2.0000 | ORAL_TABLET | ORAL | Status: DC | PRN

## 2017-08-04 MED ORDER — FENTANYL CITRATE (PF) 100 MCG/2ML IJ SOLN
INTRAMUSCULAR | Status: DC | PRN
  Administered 2017-08-04: 250 ug via INTRAVENOUS

## 2017-08-04 MED ORDER — NALOXONE HCL 0.4 MG/ML IJ SOLN: 0.4000 mg | INTRAMUSCULAR | Status: DC | PRN

## 2017-08-04 MED ORDER — LACTATED RINGERS IV SOLN
500.0000 mL | Freq: Once | INTRAVENOUS | Status: AC
  Administered 2017-08-04: 500 mL via INTRAVENOUS

## 2017-08-04 MED ORDER — ONDANSETRON HCL 4 MG/2ML IJ SOLN
4.0000 mg | Freq: Four times a day (QID) | INTRAMUSCULAR | Status: DC | PRN
Start: 2017-08-04 — End: 2017-08-04

## 2017-08-04 MED ORDER — LACTATED RINGERS IV SOLN
INTRAVENOUS | Status: DC
  Administered 2017-08-04 (×2): via INTRAVENOUS

## 2017-08-04 MED ORDER — CHLOROPROCAINE HCL (PF) 3 % IJ SOLN
INTRAMUSCULAR | Status: DC | PRN
  Administered 2017-08-04: 10 mL

## 2017-08-04 MED ORDER — KETOROLAC TROMETHAMINE 30 MG/ML IJ SOLN
30.0000 mg | Freq: Four times a day (QID) | INTRAMUSCULAR | Status: AC
  Administered 2017-08-04 (×2): 30 mg via INTRAVENOUS
  Filled 2017-08-04 (×2): qty 1

## 2017-08-04 MED ORDER — ZOLPIDEM TARTRATE 5 MG PO TABS: 5.0000 mg | ORAL_TABLET | Freq: Every evening | ORAL | Status: DC | PRN

## 2017-08-04 MED ORDER — DIPHENHYDRAMINE HCL 25 MG PO CAPS: 25.0000 mg | ORAL_CAPSULE | ORAL | Status: DC | PRN

## 2017-08-04 MED ORDER — LIDOCAINE HCL (PF) 1 % IJ SOLN
INTRAMUSCULAR | Status: DC | PRN
  Administered 2017-08-04: 6 mL via EPIDURAL

## 2017-08-04 MED ORDER — PHENYLEPHRINE HCL 10 MG/ML IJ SOLN
INTRAMUSCULAR | Status: DC | PRN
  Administered 2017-08-04 (×5): 80 ug via INTRAVENOUS

## 2017-08-04 MED ORDER — MIDAZOLAM HCL 2 MG/2ML IJ SOLN
INTRAMUSCULAR | Status: DC | PRN
  Administered 2017-08-04: 2 mg via INTRAVENOUS

## 2017-08-04 MED ORDER — KETOROLAC TROMETHAMINE 30 MG/ML IJ SOLN: 30.0000 mg | Freq: Four times a day (QID) | INTRAMUSCULAR | Status: AC | PRN

## 2017-08-04 MED ORDER — IBUPROFEN 600 MG PO TABS
600.0000 mg | ORAL_TABLET | Freq: Four times a day (QID) | ORAL | Status: DC
  Administered 2017-08-05 – 2017-08-07 (×9): 600 mg via ORAL
  Filled 2017-08-04 (×9): qty 1

## 2017-08-04 MED ORDER — ACETAMINOPHEN 325 MG PO TABS
650.0000 mg | ORAL_TABLET | ORAL | Status: DC | PRN
  Administered 2017-08-04 – 2017-08-07 (×12): 650 mg via ORAL
  Filled 2017-08-04 (×12): qty 2

## 2017-08-04 MED ORDER — OXYCODONE-ACETAMINOPHEN 5-325 MG PO TABS: 1.0000 | ORAL_TABLET | ORAL | Status: DC | PRN

## 2017-08-04 MED ORDER — ONDANSETRON HCL 4 MG/2ML IJ SOLN
INTRAMUSCULAR | Status: DC | PRN
  Administered 2017-08-04: 4 mg via INTRAVENOUS

## 2017-08-04 MED ORDER — TERBUTALINE SULFATE 1 MG/ML IJ SOLN: 0.2500 mg | Freq: Once | INTRAMUSCULAR | Status: DC | PRN

## 2017-08-04 MED ORDER — OXYTOCIN 40 UNITS IN LACTATED RINGERS INFUSION - SIMPLE MED
2.5000 [IU]/h | INTRAVENOUS | Status: DC
  Filled 2017-08-04: qty 1000

## 2017-08-04 MED ORDER — FERROUS GLUCONATE 324 (38 FE) MG PO TABS
324.0000 mg | ORAL_TABLET | Freq: Every day | ORAL | Status: DC
  Administered 2017-08-05 – 2017-08-07 (×3): 324 mg via ORAL
  Filled 2017-08-04 (×3): qty 1

## 2017-08-04 MED ORDER — ENOXAPARIN SODIUM 80 MG/0.8ML ~~LOC~~ SOLN
70.0000 mg | SUBCUTANEOUS | Status: DC
  Administered 2017-08-05 – 2017-08-07 (×3): 70 mg via SUBCUTANEOUS
  Filled 2017-08-04 (×3): qty 0.8

## 2017-08-04 MED ORDER — BETAMETHASONE SOD PHOS & ACET 6 (3-3) MG/ML IJ SUSP
12.0000 mg | INTRAMUSCULAR | Status: DC
  Administered 2017-08-04: 12 mg via INTRAMUSCULAR
  Filled 2017-08-04 (×2): qty 2

## 2017-08-04 MED ORDER — METOCLOPRAMIDE HCL 5 MG/ML IJ SOLN
INTRAMUSCULAR | Status: DC | PRN
  Administered 2017-08-04: 10 mg via INTRAVENOUS

## 2017-08-04 MED ORDER — DIPHENHYDRAMINE HCL 50 MG/ML IJ SOLN: 12.5000 mg | INTRAMUSCULAR | Status: DC | PRN

## 2017-08-04 MED ORDER — FENTANYL CITRATE (PF) 250 MCG/5ML IJ SOLN
INTRAMUSCULAR | Status: AC
  Filled 2017-08-04: qty 5

## 2017-08-04 MED ORDER — SCOPOLAMINE 1 MG/3DAYS TD PT72: 1.0000 | MEDICATED_PATCH | Freq: Once | TRANSDERMAL | Status: DC

## 2017-08-04 MED ORDER — LACTATED RINGERS IV SOLN
INTRAVENOUS | Status: DC
  Administered 2017-08-04: 23:00:00 via INTRAVENOUS

## 2017-08-04 MED ORDER — PROMETHAZINE HCL 25 MG/ML IJ SOLN: 6.2500 mg | INTRAMUSCULAR | Status: DC | PRN

## 2017-08-04 MED ORDER — OXYTOCIN BOLUS FROM INFUSION: 500.0000 mL | Freq: Once | INTRAVENOUS | Status: DC

## 2017-08-04 MED ORDER — SOD CITRATE-CITRIC ACID 500-334 MG/5ML PO SOLN: 30.0000 mL | ORAL | Status: DC | PRN

## 2017-08-04 MED ORDER — LACTATED RINGERS IV SOLN
500.0000 mL | INTRAVENOUS | Status: DC | PRN
  Administered 2017-08-04: 500 mL via INTRAVENOUS

## 2017-08-04 MED ORDER — HYDROMORPHONE HCL 1 MG/ML IJ SOLN
INTRAMUSCULAR | Status: AC
  Filled 2017-08-04: qty 1

## 2017-08-04 MED ORDER — MENTHOL 3 MG MT LOZG: 1.0000 | LOZENGE | OROMUCOSAL | Status: DC | PRN

## 2017-08-04 MED ORDER — HYDROMORPHONE HCL 1 MG/ML IJ SOLN
0.2500 mg | INTRAMUSCULAR | Status: DC | PRN
  Administered 2017-08-04: 0.5 mg via INTRAVENOUS

## 2017-08-04 MED ORDER — SIMETHICONE 80 MG PO CHEW
80.0000 mg | CHEWABLE_TABLET | Freq: Three times a day (TID) | ORAL | Status: DC
  Administered 2017-08-04 – 2017-08-06 (×6): 80 mg via ORAL
  Filled 2017-08-04 (×6): qty 1

## 2017-08-04 MED ORDER — MIDAZOLAM HCL 2 MG/2ML IJ SOLN
INTRAMUSCULAR | Status: AC
  Filled 2017-08-04: qty 2

## 2017-08-04 MED ORDER — COCONUT OIL OIL: 1.0000 "application " | TOPICAL_OIL | Status: DC | PRN

## 2017-08-04 MED ORDER — SENNOSIDES-DOCUSATE SODIUM 8.6-50 MG PO TABS
2.0000 | ORAL_TABLET | ORAL | Status: DC
  Administered 2017-08-04 – 2017-08-06 (×3): 2 via ORAL
  Filled 2017-08-04 (×3): qty 2

## 2017-08-04 MED ORDER — ONDANSETRON HCL 4 MG/2ML IJ SOLN
INTRAMUSCULAR | Status: AC
  Filled 2017-08-04: qty 2

## 2017-08-04 MED ORDER — LACTATED RINGERS IV SOLN: 500.0000 mL | Freq: Once | INTRAVENOUS | Status: AC

## 2017-08-04 MED ORDER — OXYTOCIN 10 UNIT/ML IJ SOLN
INTRAMUSCULAR | Status: AC
  Filled 2017-08-04: qty 4

## 2017-08-04 MED ORDER — OXYCODONE HCL 5 MG PO TABS
5.0000 mg | ORAL_TABLET | ORAL | Status: DC | PRN
  Administered 2017-08-04 – 2017-08-06 (×4): 5 mg via ORAL
  Filled 2017-08-04: qty 1

## 2017-08-04 MED ORDER — ENOXAPARIN SODIUM 40 MG/0.4ML ~~LOC~~ SOLN: 40.0000 mg | SUBCUTANEOUS | Status: DC

## 2017-08-04 MED ORDER — OXYTOCIN 10 UNIT/ML IJ SOLN
INTRAVENOUS | Status: DC | PRN
  Administered 2017-08-04: 40 [IU] via INTRAVENOUS

## 2017-08-04 MED ORDER — OXYTOCIN 40 UNITS IN LACTATED RINGERS INFUSION - SIMPLE MED
1.0000 m[IU]/min | INTRAVENOUS | Status: DC
  Administered 2017-08-04: 2 m[IU]/min via INTRAVENOUS

## 2017-08-04 MED ORDER — PROPOFOL 10 MG/ML IV BOLUS
INTRAVENOUS | Status: DC | PRN
  Administered 2017-08-04: 200 mg via INTRAVENOUS

## 2017-08-04 MED ORDER — DIBUCAINE 1 % RE OINT: 1.0000 "application " | TOPICAL_OINTMENT | RECTAL | Status: DC | PRN

## 2017-08-04 MED ORDER — CEFAZOLIN SODIUM-DEXTROSE 2-3 GM-%(50ML) IV SOLR
INTRAVENOUS | Status: DC | PRN
  Administered 2017-08-04: 2 g via INTRAVENOUS

## 2017-08-04 MED ORDER — HYDROCODONE-ACETAMINOPHEN 7.5-325 MG PO TABS: 1.0000 | ORAL_TABLET | Freq: Once | ORAL | Status: DC | PRN

## 2017-08-04 MED ORDER — PHENYLEPHRINE 40 MCG/ML (10ML) SYRINGE FOR IV PUSH (FOR BLOOD PRESSURE SUPPORT): 80.0000 ug | PREFILLED_SYRINGE | INTRAVENOUS | Status: DC | PRN

## 2017-08-04 MED ORDER — OXYTOCIN 40 UNITS IN LACTATED RINGERS INFUSION - SIMPLE MED: 2.5000 [IU]/h | INTRAVENOUS | Status: AC

## 2017-08-04 MED ORDER — LEVOTHYROXINE SODIUM 25 MCG PO TABS
225.0000 ug | ORAL_TABLET | Freq: Every day | ORAL | Status: DC
  Administered 2017-08-05 – 2017-08-07 (×3): 225 ug via ORAL
  Filled 2017-08-04 (×3): qty 1

## 2017-08-04 MED ORDER — CHLOROPROCAINE HCL (PF) 3 % IJ SOLN
INTRAMUSCULAR | Status: AC
  Filled 2017-08-04: qty 20

## 2017-08-04 MED ORDER — ONDANSETRON HCL 4 MG/2ML IJ SOLN: 4.0000 mg | Freq: Three times a day (TID) | INTRAMUSCULAR | Status: DC | PRN

## 2017-08-04 MED ORDER — ACETAMINOPHEN 10 MG/ML IV SOLN
1000.0000 mg | Freq: Once | INTRAVENOUS | Status: DC | PRN
  Administered 2017-08-04: 1000 mg via INTRAVENOUS

## 2017-08-04 SURGICAL SUPPLY — 39 items
BENZOIN TINCTURE PRP APPL 2/3 (GAUZE/BANDAGES/DRESSINGS) ×3 IMPLANT
CHLORAPREP W/TINT 26ML (MISCELLANEOUS) ×3 IMPLANT
CLAMP CORD UMBIL (MISCELLANEOUS) IMPLANT
CLIP FILSHIE TUBAL LIGA STRL (Clip) ×3 IMPLANT
CLOSURE STERI STRIP 1/2 X4 (GAUZE/BANDAGES/DRESSINGS) ×3 IMPLANT
CLOTH BEACON ORANGE TIMEOUT ST (SAFETY) ×3 IMPLANT
DRAPE C SECTION CLR SCREEN (DRAPES) IMPLANT
DRSG OPSITE POSTOP 4X10 (GAUZE/BANDAGES/DRESSINGS) ×3 IMPLANT
ELECT REM PT RETURN 9FT ADLT (ELECTROSURGICAL) ×3
ELECTRODE REM PT RTRN 9FT ADLT (ELECTROSURGICAL) ×2 IMPLANT
EXTRACTOR VACUUM M CUP 4 TUBE (SUCTIONS) IMPLANT
GLOVE BIO SURGEON STRL SZ7.5 (GLOVE) ×3 IMPLANT
GLOVE BIOGEL PI IND STRL 7.0 (GLOVE) ×2 IMPLANT
GLOVE BIOGEL PI INDICATOR 7.0 (GLOVE) ×1
GOWN STRL REUS W/TWL 2XL LVL3 (GOWN DISPOSABLE) ×3 IMPLANT
GOWN STRL REUS W/TWL LRG LVL3 (GOWN DISPOSABLE) ×6 IMPLANT
KIT ABG SYR 3ML LUER SLIP (SYRINGE) IMPLANT
NEEDLE HYPO 22GX1.5 SAFETY (NEEDLE) ×3 IMPLANT
NEEDLE HYPO 25X5/8 SAFETYGLIDE (NEEDLE) IMPLANT
NS IRRIG 1000ML POUR BTL (IV SOLUTION) ×3 IMPLANT
PACK C SECTION WH (CUSTOM PROCEDURE TRAY) ×3 IMPLANT
PAD OB MATERNITY 4.3X12.25 (PERSONAL CARE ITEMS) ×3 IMPLANT
PENCIL SMOKE EVAC W/HOLSTER (ELECTROSURGICAL) ×3 IMPLANT
RTRCTR C-SECT PINK 25CM LRG (MISCELLANEOUS) ×3 IMPLANT
STRIP CLOSURE SKIN 1/2X4 (GAUZE/BANDAGES/DRESSINGS) ×3 IMPLANT
SUT CHROMIC 1 CTX 36 (SUTURE) ×6 IMPLANT
SUT PLAIN 2 0 XLH (SUTURE) ×6 IMPLANT
SUT VIC AB 1 CT1 36 (SUTURE) ×9 IMPLANT
SUT VIC AB 2-0 CT1 (SUTURE) ×3 IMPLANT
SUT VIC AB 2-0 CT1 27 (SUTURE) ×1
SUT VIC AB 2-0 CT1 TAPERPNT 27 (SUTURE) ×2 IMPLANT
SUT VIC AB 3-0 CT1 27 (SUTURE) ×2
SUT VIC AB 3-0 CT1 TAPERPNT 27 (SUTURE) ×4 IMPLANT
SUT VIC AB 3-0 SH 27 (SUTURE)
SUT VIC AB 3-0 SH 27X BRD (SUTURE) IMPLANT
SUT VIC AB 4-0 KS 27 (SUTURE) ×3 IMPLANT
SYR BULB IRRIGATION 50ML (SYRINGE) IMPLANT
TOWEL OR 17X24 6PK STRL BLUE (TOWEL DISPOSABLE) ×3 IMPLANT
TRAY FOLEY W/BAG SLVR 14FR LF (SET/KITS/TRAYS/PACK) ×3 IMPLANT

## 2017-08-04 NOTE — Progress Notes (Signed)
  SVE unchanged. FHT Cat I x 2 Toco: ctx q1-4 min  Will start IV Pitocin

## 2017-08-04 NOTE — Anesthesia Postprocedure Evaluation (Signed)
Anesthesia Post Note  Patient: Lori Valentine  Procedure(s) Performed: CESAREAN SECTION (N/A Abdomen) BILATERAL TUBAL LIGATION (Bilateral Abdomen)     Patient location during evaluation: Mother Baby Anesthesia Type: Epidural Level of consciousness: awake Pain management: satisfactory to patient Vital Signs Assessment: post-procedure vital signs reviewed and stable Respiratory status: spontaneous breathing Cardiovascular status: stable Anesthetic complications: no    Last Vitals:  Vitals:   08/04/17 1707 08/04/17 1823  BP: (!) 150/76 (!) 145/76  Pulse: 98 (!) 103  Resp: 18 18  Temp: 36.9 C 37.1 C  SpO2: 94% 93%    Last Pain:  Vitals:   08/04/17 1823  TempSrc: Oral  PainSc: 1    Pain Goal: Patients Stated Pain Goal: 3 (08/04/17 1510)               Cephus Shelling

## 2017-08-04 NOTE — Op Note (Addendum)
Lori Valentine PROCEDURE DATE: 08/04/2017  PREOPERATIVE DIAGNOSES: Intrauterine pregnancy at [redacted]w[redacted]d weeks gestation; di/di twin pregnancy; cord prolapse twin B; undesired fertility  POSTOPERATIVE DIAGNOSES: The same  PROCEDURE: Primary Low Transverse Cesarean Section, Bilateral Tubal Sterilization using Filshie clips  SURGEON:  Dr. Nettie Elm  ASSISTANT:  Dr. Caryl Ada  ANESTHESIOLOGIST: Anesthesiologist: Trevor Iha, MD CRNA: Armanda Heritage, CRNA  INDICATIONS: Lori Valentine is a 32 y.o. [phone] at [redacted]w[redacted]d here for cesarean section and bilateral tubal sterilization secondary to the indications listed under preoperative diagnoses; please see preoperative note for further details.  The risks of surgery were discussed with the patient including but were not limited to: bleeding which may require transfusion or reoperation; infection which may require antibiotics; injury to bowel, bladder, ureters or other surrounding organs; injury to the fetus; need for additional procedures including hysterectomy in the event of a life-threatening hemorrhage; placental abnormalities wth subsequent pregnancies, incisional problems, thromboembolic phenomenon and other postoperative/anesthesia complications.  Patient also desires permanent sterilization.  Other reversible forms of contraception were discussed with patient; she declines all other modalities. Risks of procedure discussed with patient including but not limited to: risk of regret, permanence of method, bleeding, infection, injury to surrounding organs and need for additional procedures.  Failure risk of 1-2% with increased risk of ectopic gestation if pregnancy occurs was also discussed with patient.  The patient concurred with the proposed plan, giving informed written consent for the procedures.    FINDINGS:  Viable female infant in cephalic presentation.  Apgars 9 and 9.  Clear amniotic fluid.  Intact placenta, three vessel cord.  Normal  uterus, fallopian tubes and ovaries bilaterally. Fallopian tubes sterilized with Filshie clips bilaterally.  ANESTHESIA: General INTRAVENOUS FLUIDS: 1500 ml ESTIMATED BLOOD LOSS: 995 ml URINE OUTPUT:  N/A SPECIMENS: Placenta sent to pathology COMPLICATIONS: None immediate  PROCEDURE IN DETAIL:  The patient was sent to operating room due to STAT cesarean section for cord prolapse. Sequential compression devices applied to her lower extremities.  Epidural anesthesia was not adequate so patient was put under general anesthesia. She was then prepped and draped in a sterile manner.  A foley catheter was unable to be placed in bladder. After an adequate timeout was performed, a Pfannenstiel skin incision was made with scalpel and carried through to the underlying layer of fascia. The fascia was incised in the midline, and this incision was extended bluntly. The underlying rectus muscles were dissected off bluntly. The rectus muscles were separated in the midline bluntly and the peritoneum was entered bluntly. Attention was turned to the lower uterine segment where a low transverse hysterotomy was made with a scalpel and extended bilaterally bluntly.  The infant was successfully delivered, the cord was clamped and cut after one minute, and the infant was handed over to awaiting neonatology team. Uterine massage was then administered, and both placentas delivered intact with a three-vessel cord. The uterus was then cleared of clots and debris.  The hysterotomy was closed with 1-0 Chromic in a running locked fashion.  Figure-of-eight serosal stitches were placed to help with hemostasis.  Attention was then turned to the fallopian tubes, and Filshie clips were placed about 3 cm from the cornua, with care given to incorporate the underlying mesosalpinx on both sides, allowing for bilateral tubal sterilization. The pelvis was cleared of all clot and debris. Hemostasis was confirmed on all surfaces.  The peritoneum was  closed with a 2-0 Vicryl running stitch and the rectus muscles were reapproximated using interrupted  stitche. The fascia was then closed using 0 Vicryl in a running fashion.  The subcutaneous layer was irrigated, then reapproximated with 3-0 plain gut continuous sutures.  The skin was closed with a 4-0 Vicryl subcuticular stitch. The patient tolerated the procedure well. Sponge, lap, instrument and needle counts were correct x 3.  She was taken to the recovery room in stable condition.   An experienced assistant was required given the standard of surgical care given the complexity of the case.  This assistant was needed for exposure, dissection, suctioning, retraction, instrument exchange, assisting with delivery with administration of fundal pressure, and for overall help during the procedure.  Caryl Ada, DO OB Fellow [facility]   I was present and scrubbed in for the entire case.  Nettie Elm, MD [facility] Attending

## 2017-08-04 NOTE — Anesthesia Pain Management Evaluation Note (Signed)
  CRNA Pain Management Visit Note  Patient: Lori Valentine, 32 y.o., female  "Hello I am a member of the anesthesia team at [facility]. We have an anesthesia team available at all times to provide care throughout the hospital, including epidural management and anesthesia for C-section. I don't know your plan for the delivery whether it a natural birth, water birth, IV sedation, nitrous supplementation, doula or epidural, but we want to meet your pain goals."   1.Was your pain managed to your expectations on prior hospitalizations?   Yes   2.What is your expectation for pain management during this hospitalization?     Epidural  3.How can we help you reach that goal? epidural  Record the patient's initial score and the patient's pain goal.   Pain: 2  Pain Goal: 5 [facility] wants you to be able to say your pain was always managed very well.  Derrius Furtick 08/04/2017

## 2017-08-04 NOTE — Anesthesia Procedure Notes (Signed)
Procedure Name: Intubation Date/Time: 08/04/2017 12:30 PM Performed by: Genevie Ann, CRNA Pre-anesthesia Checklist: Patient identified, Emergency Drugs available, Suction available, Patient being monitored and Timeout performed Patient Re-evaluated:Patient Re-evaluated prior to induction Oxygen Delivery Method: Circle system utilized Preoxygenation: Pre-oxygenation with 100% oxygen Induction Type: IV induction Laryngoscope Size: Mac and 3 Grade View: Grade I Number of attempts: 1

## 2017-08-04 NOTE — Anesthesia Preprocedure Evaluation (Signed)
Anesthesia Evaluation  Patient identified by MRN, date of birth, ID band Patient awake    Reviewed: Allergy & Precautions, NPO status , Patient's Chart, lab work & pertinent test results  Airway Mallampati: III  TM Distance: >3 FB Neck ROM: Full    Dental no notable dental hx.    Pulmonary former smoker,    Pulmonary exam normal breath sounds clear to auscultation       Cardiovascular negative cardio ROS Normal cardiovascular exam Rhythm:Regular Rate:Normal     Neuro/Psych negative neurological ROS     GI/Hepatic negative GI ROS, Neg liver ROS,   Endo/Other  negative endocrine ROSHypothyroidism Morbid obesity  Renal/GU negative Renal ROS     Musculoskeletal  (+) Arthritis , Rheumatoid disorders,    Abdominal (+) + obese,   Peds  Hematology  (+) anemia ,   Anesthesia Other Findings   Reproductive/Obstetrics (+) Pregnancy                             Lab Results  Component Value Date   WBC 10.0 08/04/2017   HGB 9.0 (L) 08/04/2017   HCT 28.3 (L) 08/04/2017   MCV 75.5 (L) 08/04/2017   PLT 238 08/04/2017    Anesthesia Physical Anesthesia Plan  ASA: III  Anesthesia Plan: Epidural   Post-op Pain Management:    Induction:   PONV Risk Score and Plan:   Airway Management Planned: Natural Airway  Additional Equipment:   Intra-op Plan:   Post-operative Plan:   Informed Consent: I have reviewed the patients History and Physical, chart, labs and discussed the procedure including the risks, benefits and alternatives for the proposed anesthesia with the patient or authorized representative who has indicated his/her understanding and acceptance.     Plan Discussed with: CRNA and Anesthesiologist  Anesthesia Plan Comments:         Anesthesia Quick Evaluation

## 2017-08-04 NOTE — Addendum Note (Signed)
Addendum  created 08/04/17 1842 by Algis Greenhouse, CRNA   Sign clinical note

## 2017-08-04 NOTE — Anesthesia Postprocedure Evaluation (Signed)
Anesthesia Post Note  Patient: Lori Valentine  Procedure(s) Performed: CESAREAN SECTION (N/A Abdomen) BILATERAL TUBAL LIGATION (Bilateral Abdomen)     Patient location during evaluation: PACU Anesthesia Type: General Level of consciousness: awake and alert Pain management: pain level controlled Vital Signs Assessment: post-procedure vital signs reviewed and stable Respiratory status: spontaneous breathing, nonlabored ventilation, respiratory function stable and patient connected to nasal cannula oxygen Cardiovascular status: blood pressure returned to baseline and stable Postop Assessment: no apparent nausea or vomiting Anesthetic complications: no    Last Vitals:  Vitals:   08/04/17 1415 08/04/17 1430  BP: (!) 100/50 123/72  Pulse: 97 81  Resp: (!) 24 (!) 23  Temp:    SpO2: 96% 94%    Last Pain:  Vitals:   08/04/17 1435  TempSrc:   PainSc: 5    Pain Goal:                 Trevor Iha

## 2017-08-04 NOTE — Anesthesia Procedure Notes (Signed)
Epidural Patient location during procedure: OB Start time: 08/04/2017 9:56 AM End time: 08/04/2017 10:10 AM  Staffing Anesthesiologist: Trevor Iha, MD Performed: anesthesiologist   Preanesthetic Checklist Completed: patient identified, site marked, surgical consent, pre-op evaluation, timeout performed, IV checked, risks and benefits discussed and monitors and equipment checked  Epidural Patient position: sitting Prep: site prepped and draped and DuraPrep Patient monitoring: continuous pulse ox and blood pressure Approach: midline Location: L3-L4 Injection technique: LOR air  Needle:  Needle type: Tuohy  Needle gauge: 17 G Needle length: 9 cm and 9 Needle insertion depth: 5 cm cm Catheter type: closed end flexible Catheter size: 19 Gauge Catheter at skin depth: 15 cm Test dose: negative  Assessment Events: blood not aspirated, injection not painful, no injection resistance, negative IV test and no paresthesia

## 2017-08-04 NOTE — H&P (Addendum)
LABOR AND DELIVERY ADMISSION HISTORY AND PHYSICAL NOTE  Lori Valentine is a 32 y.o. female G2P1001 with IUP with dichorionic/diamniotic twin gestation at 28w1dby first trimester ultrasound presenting for SROM.   The patient had a gush of fluid around 0100 this morning. She is only feeling very mild contractions at this time.   She reports positive fetal movement. No vaginal bleeding.  Prenatal History/Complications: Hypothyroidism on levothyroxine Rheumatoid arthritis Obesity (BMI 50) Anxiety Anemia (hgb 8.6) Tobacco use  [facility]: [facility]  Sonogram:  Last completed 42/42/68Fetus A Cephalic presentation Posterior placenta above cervical os AFI 2.6; subjectively within normal limits EFW Fetus A 2333g (39%)- Female  Fetus B Cephalic presentation Anterior placenta; above cervical os AFI 3.1; subjectively within normal limits EFW Fetus B 2915 (81%)- Female  Past Medical History: Past Medical History:  Diagnosis Date  . Hypothyroid   . Rheumatoid arthritis ([place]   . 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 NO PAST SURGERIES      Obstetrical History: OB History    Gravida  2   Para  1   Term  1   Preterm  0   AB  0   Living  1     SAB  0   TAB  0   Ectopic  0   Multiple  0   Live Births  1           Social History: Social History   Socioeconomic History  . Marital status: Single    Spouse name: Not on file  . Number of children: Not on file  . Years of education: Not  on file  . Highest education level: Not on file  Occupational History  . Not on file  Social Needs  . Financial resource strain: Not on file  . Food insecurity:    Worry: Not on file    Inability: Not on file  . Transportation needs:    Medical: Not on file    Non-medical: Not on file  Tobacco Use  . Smoking status: Former Smoker    Packs/day: 0.50    Years: 10.00    Pack years: 5.00  . Smokeless tobacco: Never Used  . Tobacco comment: 10 cigarettes daily  Substance and Sexual Activity  . Alcohol use: No    Alcohol/week: 0.0 oz    Frequency: Never  . Drug use: No  . Sexual activity: Never    Birth control/protection: None  Lifestyle  . Physical activity:    Days per week: Not on file    Minutes per session: Not on file  . Stress: Not on file  Relationships  . Social connections:    Talks on phone: Not on file    Gets together: Not on file    Attends religious service: Not on file    Active member of  club or organization: Not on file    Attends meetings of clubs or organizations: Not on file    Relationship status: Not on file  Other Topics Concern  . Not on file  Social History Narrative   23 year old son   Lives with  Sister and nephew and son.   Works as Art therapist at [employer]    Family History: Family History  Problem Relation Age of Onset  . Schizophrenia Mother   . Bipolar disorder Mother   . Diabetes Other   . Heart disease Other     Allergies: Allergies  Allergen Reactions  . Effexor [Venlafaxine]     "Groggy, unable to concentrate"    Medications Prior to Admission  Medication Sig Dispense Refill Last Dose  . ferrous gluconate (FERGON) 324 MG tablet Take 1 tablet (324 mg total) by mouth daily with breakfast. 60 tablet 3 08/03/2017 at Unknown time  . levothyroxine (SYNTHROID, LEVOTHROID) 50 MCG tablet You should have the 147mg pills already. Take one 1766m pill and one 5077mpill po qday before breakfast for total dose of 225m34mer  day 30 tablet 1 08/03/2017 at Unknown time  . Prenatal MV & Min w/FA-DHA (PRENATAL ADULT GUMMY/DHA/FA) 0.4-25 MG CHEW Chew 1 tablet by mouth daily. 30 tablet 12 08/03/2017 at Unknown time  . acetaminophen (TYLENOL) 500 MG tablet Take 500 mg by mouth as needed.   Taking  . Certolizumab Pegol 2 X 200 MG/ML KIT Inject 400 mg into the skin every 28 (twenty-eight) days. 3 kit 0 Taking  . methylPREDNISolone (MEDROL DOSEPAK) 4 MG TBPK tablet Take as directed 21 tablet 0      Review of Systems   All systems reviewed and negative except as stated in HPI  Pulse 98, temperature 97.7 F (36.5 C), last menstrual period 11/17/2016. General appearance: alert, cooperative and no distress Lungs: clear to auscultation bilaterally Heart: regular rate and rhythm Abdomen: soft, non-tender; bowel sounds normal Extremities: No calf swelling or tenderness Presentation: cephalic Fetal monitoring:  A 140, +accels, -decels, mod variability  B 130, +accels, -decels, mod variability  Uterine activity: uterine contractions q2-4min15m  Prenatal labs: ABO, Rh: A/Positive/-- (11/06 1057) Antibody: Negative (11/06 1057) Rubella: 5.45 (11/06 1057) RPR: Non Reactive (03/13 1042)  HBsAg: Negative (11/06 1057)  HIV: Non Reactive (03/13 1042)  GBS: Negative (04/25 0844)  1 hr Glucola: normal Genetic screening:  normal Anatomy US: n[place]oreamal female and female  Prenatal Transfer Tool  Maternal Diabetes: No Genetic Screening: Normal Maternal Ultrasounds/Referrals: Normal Fetal Ultrasounds or other Referrals:  None Maternal Substance Abuse:  Yes:  Type: Smoker Significant Maternal Medications:  Meds include: Other: levothyroxine; cetrolizumab Significant Maternal Lab Results: Lab values include: Group B Strep negative  Results for orders placed or performed during the hospital encounter of 08/04/17 (from the past 24 hour(s))  [facility]: 08/04/17  2:26 AM  Result Value Ref Range   POCT Fern Test  Positive = ruptured amniotic membanes     Patient Active Problem List   Diagnosis Date Noted  . Anemia affecting pregnancy 08/01/2017  . BMI 50.0-59.9, adult (HCC) [place]25/2019  . Obesity in pregnancy, antepartum 03/11/2017  . Maternal rheumatoid arthritis complicating pregnancy (HCC) [place]07/2018  . Supervision of high-risk pregnancy 02/08/2017  . 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 History of anxiety and depression 10/22/2016  . Smoker 10/22/2016  . Rheumatoid arthritis (HCC) [place]07/2017  . Hypothyroidism affecting pregnancy 07/29/2014    Assessment:  Lori Valentine is a 32 y.o. G2P1001 at 109w1dhere with SROM around 0100 this morning.   #Labor: PROM. Low bishop score. Expectant management for now. If minimal progress achieved, will discuss induction of labor at that point, likely with pitocin.  #Pain: Patient desires an epidural when appropriate #FWB: Cat I; reassuring for Fetus A and Fetus B #ID:  GBS negative; no abx indicated #MOF: Breast #MOC: BTL (Consent signed 06/21/17) #Circ:  N/a; girl  CLoann Quill MD PGY-3 08/04/2017, 2:53 AM  OB FELLOW HISTORY AND PHYSICAL ATTESTATION I have seen and examined this patient; I agree with above documentation in the resident's note.   32y.o. G2P1001 with di-di twin IUP. Here for PROM.   Di-di twins: patient would like vaginal delivery. Vertex/vertex PPROM: She is contracting some. Expectant management for now. Augment if not in active labor Prematurity: BMZ given, repeat in 24 hours if undelivered GBS: Cat I x 2 GBS negative  JGailen Shelter MD OB Fellow 08/04/2017, 4:18 AM

## 2017-08-04 NOTE — Transfer of Care (Addendum)
Immediate Anesthesia Transfer of Care Note  Patient: Lori Valentine  Procedure(s) Performed: CESAREAN SECTION (N/A Abdomen) BILATERAL TUBAL LIGATION (Bilateral Abdomen)  Patient Location: PACU  Anesthesia Type:General  Level of Consciousness: awake, alert  and oriented  Airway & Oxygen Therapy: Patient Spontanous Breathing and Patient connected to nasal cannula oxygen  Post-op Assessment: Report given to RN and Post -op Vital signs reviewed and stable  Post vital signs: Reviewed and stable  Last Vitals:  Vitals Value Taken Time  BP 95/48 08/04/2017  1:30 PM  Temp    Pulse 109 08/04/2017  1:35 PM  Resp 19 08/04/2017  1:35 PM  SpO2 96 % 08/04/2017  1:35 PM  Vitals shown include unvalidated device data.  Last Pain:  Vitals:   08/04/17 1326  TempSrc:   PainSc: 0-No pain         Complications: No apparent anesthesia complications

## 2017-08-05 LAB — CBC
HEMATOCRIT: 22.5 % — AB (ref 36.0–46.0)
HEMOGLOBIN: 6.9 g/dL — AB (ref 12.0–15.0)
MCH: 23.8 pg — ABNORMAL LOW (ref 26.0–34.0)
MCHC: 30.7 g/dL (ref 30.0–36.0)
MCV: 77.6 fL — ABNORMAL LOW (ref 78.0–100.0)
Platelets: 220 10*3/uL (ref 150–400)
RBC: 2.9 MIL/uL — ABNORMAL LOW (ref 3.87–5.11)
RDW: 16.3 % — ABNORMAL HIGH (ref 11.5–15.5)
WBC: 13.6 10*3/uL — AB (ref 4.0–10.5)

## 2017-08-05 LAB — PREPARE RBC (CROSSMATCH)

## 2017-08-05 MED ORDER — SODIUM CHLORIDE 0.9 % IV SOLN: Freq: Once | INTRAVENOUS | Status: DC

## 2017-08-05 NOTE — Addendum Note (Signed)
Addendum  created 08/05/17 1428 by Trevor Iha, MD   Intraprocedure Staff edited

## 2017-08-05 NOTE — Addendum Note (Signed)
Addendum  created 08/05/17 0805 by Graciela Husbands, CRNA   Sign clinical note

## 2017-08-05 NOTE — Anesthesia Postprocedure Evaluation (Signed)
Anesthesia Post Note  Patient: Lori Valentine  Procedure(s) Performed: CESAREAN SECTION (N/A Abdomen) BILATERAL TUBAL LIGATION (Bilateral Abdomen)     Patient location during evaluation: Mother Baby Anesthesia Type: Epidural Level of consciousness: awake and alert Pain management: satisfactory to patient Vital Signs Assessment: post-procedure vital signs reviewed and stable Respiratory status: spontaneous breathing and respiratory function stable Cardiovascular status: stable Postop Assessment: adequate PO intake Anesthetic complications: no    Last Vitals:  Vitals:   08/05/17 0400 08/05/17 0530  BP:  128/74  Pulse:  76  Resp:  19  Temp:  37.2 C  SpO2: 96% 98%    Last Pain:  Vitals:   08/05/17 0530  TempSrc: Oral  PainSc: 0-No pain   Pain Goal: Patients Stated Pain Goal: 3 (08/04/17 1510)               Vicky Mccanless

## 2017-08-05 NOTE — Progress Notes (Addendum)
POSTPARTUM PROGRESS NOTE  Post Partum Day 1/ Post Operative Day 1  Subjective:  Lori Valentine is a 32 y.o. [phone] 54w1ds/p SVD twin A and pLTCS for cord prolapse for twin B, as well as BTL.    No acute events overnight.  Morning labs however revealed a critically low hemoglobin. Pt denies problems with ambulating, voiding or po intake.  She denies nausea or vomiting.  Pain is well controlled.  She has not had flatus. She has not had bowel movement.  Lochia Minimal.   Objective: Blood pressure 128/74, pulse 76, temperature 99 F (37.2 C), temperature source Oral, resp. rate 19, height 5' 8" (1.727 m), weight (!) 147.3 kg (324 lb 11.2 oz), last menstrual period 11/17/2016, SpO2 98 %, unknown if currently breastfeeding.  Physical Exam:  General: alert, cooperative and no distress Lochia:normal flow Chest: CTAB Heart: RRR no m/r/g Abdomen: +BS, soft, nontender,  Uterine Fundus: firm, palpable just inferior to umbilicus. Incision appears to be healing well. Small amount of blood along the right lateral region, but does not appear to be actively bleeding.  DVT Evaluation: No calf swelling or tenderness Extremities: no lower extremity edema  Recent Labs    08/04/17 1728 08/05/17 0527  HGB 8.3* 6.9*  HCT 26.9* 22.5*    Assessment/Plan:  ASSESSMENT: Lori Hornis a 32y.o. G2P1103 368w1d/p SVD twin A and pLTCS for cord prolapse for twin B, as well as BTL.    Anemia: likely secondary to iron deficiency as well as post-operative blood losses. Hemodynamically stable and asymptomatic. Will provide 2u of pRBCs. Will need post-transfusion CBC.   Hypothyroidism: Levothyroxine restarted today at home dose. Likely can decrease dose to pre-conception level at discharge, with follow-up of TSH and Free T4 4-6 weeks postpartum.   Pain appears to be controlled with oral medications at this time Currently working with lactation on breastfeeding BTL for contraception   Plan for discharge  tomorrow or within 48hrs pending clinical course.    LOS: 1 day   MeCrissie SicklesMD PGY-3 08/05/2017, 9:10 AM   I have spoken with and examined this patient and agree with resident/PA-S/Med-S/SNM's note and plan of care. VSS, HRR&R, Resp unlabored, Legs neg.  FrNigel BertholdCNM 08/09/2017 11:38 AM

## 2017-08-05 NOTE — Lactation Note (Signed)
This note was copied from a baby's chart. Lactation Consultation Note Baby girl is taking formula well. Baby boy is spitting up mucous.  Babies 14 hrs old. Mom holding baby boy STS.  LPI information reviewed, STS, importance of I&O, hand expression, pumping, supply and demand. Newborn LPI feeding habits, behavior, and needs discussed. Monitoring for feeding tolerance, and supplementing. Mom encouraged to feed baby 8-12 times/24 hours and with feeding cues.  Mom encouraged to waken baby for feeds if hasn't cued in 2-3 hrs. RN set up DEBP, mom has all ready pumped, stating she collected a few drops on the flange. Mom knows to pump q3h for 15-20 min.  Mom has large breast, large nipples, and a lot of edema. Nipples not compressible. Lt. Breast w/hard areola edema. To tender for mom to stand reverse pressure or hand expression. Rt. Breast has edema as well but not as hard as Lt. Breast. Theres no way at this time can the babies latch to mom's breast until edema lessens a lot. Shells given for mom to wear in am. Encouraged mom to lay back some in bed and elevate breast to help lessen the edema. Ice given to place on edema.  Since babies aren't able to go to the breast at this time d/t edema, reviewed w/mom she will need to give them more. Information sheet for the amount needed if not breast feeding. Reviewed w/RN as well. Similac 22 cal. Given.  Encouraged mom to report to RN babies not feeding, difficulty waking, or increased jittery. Call for questions or assistance.  [facility] brochure given w/resources, support groups and LC services.    Patient Name: Lori Valentine [id number] Date: 08/05/2017 Reason for consult: Initial assessment;Late-preterm 34-36.6wks;Infant < 6lbs;Multiple gestation   Maternal Data Has patient been taught Hand Expression?: Yes Does the patient have breastfeeding experience prior to this delivery?: Yes  Feeding Feeding Type: Bottle Fed - Formula Nipple Type: Slow -  flow  LATCH Score       Type of Nipple: Everted at rest and after stimulation  Comfort (Breast/Nipple): Filling, red/small blisters or bruises, mild/mod discomfort(skin intact, very tender/lots edema)        Interventions Interventions: Breast feeding basics reviewed;Skin to skin;Expressed milk;Breast massage;Hand express;Shells;Pre-pump if needed;Reverse pressure;DEBP;Breast compression  Lactation Tools Discussed/Used Tools: Shells;Pump Shell Type: Inverted Breast pump type: Double-Electric Breast Pump   Consult Status Consult Status: Follow-up Date: 08/05/17 Follow-up type: In-patient    Ottilie Wigglesworth, Diamond Nickel 08/05/2017, 2:21 AM

## 2017-08-05 NOTE — Progress Notes (Signed)
.  MOB was referred for history of depression/anxiety. * Referral screened out by Clinical Social Worker because none of the following criteria appear to apply: ~ History of anxiety/depression during this pregnancy, or of post-partum depression. ~ Diagnosis of anxiety and/or depression within last 3 years OR * MOB's symptoms currently being treated with medication and/or therapy. Please contact the Clinical Social Worker if needs arise, by MOB request, or if MOB scores greater than 9/yes to question 10 on Edinburgh Postpartum Depression Screen.  

## 2017-08-06 LAB — TYPE AND SCREEN
ABO/RH(D): A POS
ANTIBODY SCREEN: NEGATIVE
UNIT DIVISION: 0
Unit division: 0

## 2017-08-06 LAB — CBC
HCT: 27.3 % — ABNORMAL LOW (ref 36.0–46.0)
HEMOGLOBIN: 8.6 g/dL — AB (ref 12.0–15.0)
MCH: 24.9 pg — ABNORMAL LOW (ref 26.0–34.0)
MCHC: 31.5 g/dL (ref 30.0–36.0)
MCV: 79.1 fL (ref 78.0–100.0)
Platelets: 209 10*3/uL (ref 150–400)
RBC: 3.45 MIL/uL — ABNORMAL LOW (ref 3.87–5.11)
RDW: 16.5 % — ABNORMAL HIGH (ref 11.5–15.5)
WBC: 11.8 10*3/uL — ABNORMAL HIGH (ref 4.0–10.5)

## 2017-08-06 LAB — BPAM RBC
BLOOD PRODUCT EXPIRATION DATE: 201905192359
Blood Product Expiration Date: 201905192359
ISSUE DATE / TIME: 201905031345
ISSUE DATE / TIME: 201905031028
Unit Type and Rh: 6200
Unit Type and Rh: 6200

## 2017-08-06 MED ORDER — FUROSEMIDE 40 MG PO TABS
40.0000 mg | ORAL_TABLET | Freq: Once | ORAL | Status: AC
  Administered 2017-08-06: 40 mg via ORAL
  Filled 2017-08-06: qty 1

## 2017-08-06 NOTE — Progress Notes (Signed)
Post Partum Day #2/Post Op Day #2 from C/S for second twin and BTL Subjective: S/p transfusion of 2u PRBCs yesterday no complaints, up ad lib and tolerating PO; denies dizziness with ambulation; mostly breastfeeding- going well  Objective: Blood pressure 135/75, pulse 74, temperature 97.8 F (36.6 C), temperature source Oral, resp. rate 18, height 5\' 8"  (1.727 m), weight (!) 147.3 kg (324 lb 11.2 oz), last menstrual period 11/17/2016, SpO2 98 %, unknown if currently breastfeeding.  Physical Exam:  General: cooperative, fatigued and mild distress Lochia: appropriate Uterine Fundus: firm Incision: honeycomb intact, saturated approx 50% but marked and unchanged DVT Evaluation: No evidence of DVT seen on physical exam. Pitting edema up to thigh level  Recent Labs    08/05/17 0527 08/06/17 0532  HGB 6.9* 8.6*  HCT 22.5* 27.3*    Assessment/Plan: Plan for discharge tomorrow; pt may potentially want to go home later today if babies are discharged Will give Lasix for edema- BPs stable   LOS: 2 days   10/06/17 CNM 08/06/2017, 9:12 AM

## 2017-08-06 NOTE — Discharge Instructions (Signed)

## 2017-08-07 MED ORDER — OXYCODONE HCL 5 MG PO TABS: 5.0000 mg | ORAL_TABLET | ORAL | 0 refills | Status: DC | PRN

## 2017-08-07 MED ORDER — IBUPROFEN 600 MG PO TABS: 600.0000 mg | ORAL_TABLET | Freq: Four times a day (QID) | ORAL | 0 refills | Status: DC

## 2017-08-07 NOTE — Discharge Summary (Signed)
OB Discharge Summary     Patient Name: Lori Valentine DOB: May 06, 1985 MRN: [id number]  Date of admission: 08/04/2017 Delivering MD:    Lori, Valentine [[id number]]  Lori, Sanjuana, Valentine [[id number]]  Lori Valentine   Date of discharge: 08/07/2017  Admitting diagnosis: 65 WEEKS ROM Intrauterine pregnancy: 104w1d    Secondary diagnosis:  Active Problems:   Hypothyroidism affecting pregnancy   History of anxiety and depression   Supervision of high-risk pregnancy   Twin gestation, dichorionic diamniotic   Obesity in pregnancy, antepartum   Maternal rheumatoid arthritis complicating pregnancy (Lori Valentine   Anemia affecting pregnancy   Normal labor Cord Prolapse Additional problems: Prolapsed cord of Twin B necessitating cesarean delivery     Discharge diagnosis: Preterm Pregnancy Delivered and Twin pregnancy                                                                                                Post partum procedures:BTL with Cesarean  Augmentation: Pitocin  Complications: None  Hospital course:  Onset of Labor With Unplanned C/S  32y.o. yo [id number] 31w1das [place]. Patient had a labor course significant for cord prolapse after vaginal delivery of Twin A, then had Cesarean Delivery of Twin B.  . Membrane Rupture Time/Date:    Lori, [place]0[[id number]]1:00 AM   Lori, [place]0[[id number]]12:17 PM ,   Lori, [place]0[[id number]]08/04/2017   Lori, [place]0[[id number]]08/04/2017   The patient went for cesarean section due to Cord Prolapse, and delivered a Viable infant,   Lori, [place]0[[id number]]08/04/2017   Lori, [place]0[[id number]]08/04/2017  Details of operation can be found in separate operative note. Patient had an uncomplicated postpartum course.  She is ambulating,tolerating a regular diet, passing flatus, and urinating well.  Patient is discharged home in stable condition  08/07/17.  Physical exam  Vitals:   08/05/17 1615 08/06/17 0617 08/06/17 1806 08/07/17 0455  BP: 121/74 135/75 134/77 (!) 149/87  Pulse: 88 74 80 81  Resp: 16 18 20    Temp: 97.8 F (36.6 C) 97.8 F (36.6 C) (!) 97.5 F (36.4 C) 98.1 F (36.7 C)  TempSrc: Oral Oral Oral Oral  SpO2: 98%     Weight:      Height:       General: alert, cooperative and no distress Lochia: appropriate Uterine Fundus: firm Incision: Healing well with no significant drainage, Dressing is clean, dry, and intact DVT Evaluation: No evidence of DVT seen on physical exam.   Significant edema of lower extremities Labs: Lab Results  Component Value Date   WBC 11.8 (H) 08/06/2017   HGB 8.6 (L) 08/06/2017   HCT 27.3 (L) 08/06/2017   MCV 79.1 08/06/2017   PLT 209 08/06/2017   CMP Latest Ref Rng & Units 08/04/2017  Glucose 65 - 99 mg/dL -  BUN 6 - 20 mg/dL -  Creatinine 0.44 - 1.00 mg/dL 0.54  Sodium 135 - 145 mmol/L -  Potassium 3.5 -  5.1 mmol/L -  Chloride 101 - 111 mmol/L -  CO2 22 - 32 mmol/L -  Calcium 8.9 - 10.3 mg/dL -  Total Protein 6.5 - 8.1 g/dL -  Total Bilirubin 0.3 - 1.2 mg/dL -  Alkaline Phos 38 - 126 U/L -  AST 15 - 41 U/L -  ALT 14 - 54 U/L -    Discharge instruction: per After Visit Summary and "Baby and Me Booklet".  After visit meds:  Allergies as of 08/07/2017      Reactions   Effexor [venlafaxine]    "Groggy, unable to concentrate"      Medication List    TAKE these medications   acetaminophen 500 MG tablet Commonly known as:  TYLENOL Take 500 mg by mouth as needed for mild pain or headache.   calcium carbonate 500 MG chewable tablet Commonly known as:  TUMS - dosed in mg elemental calcium Chew 1 tablet by mouth 2 (two) times daily as needed for indigestion or heartburn.   Certolizumab Pegol 2 X 200 MG/ML Kit Inject 400 mg into the skin every 28 (twenty-eight) days.   ferrous gluconate 324 MG tablet Commonly known as:  FERGON Take 1 tablet (324 mg total) by mouth  daily with breakfast.   ibuprofen 600 MG tablet Commonly known as:  ADVIL,MOTRIN Take 1 tablet (600 mg total) by mouth every 6 (six) hours.   levothyroxine 50 MCG tablet Commonly known as:  SYNTHROID, LEVOTHROID You should have the 131mg pills already. Take one 1733m pill and one 5031mpill po qday before breakfast for total dose of 225m19mer day What changed:    how much to take  how to take this  when to take this  additional instructions   methylPREDNISolone 4 MG Tbpk tablet Commonly known as:  MEDROL DOSEPAK Take as directed   oxyCODONE 5 MG immediate release tablet Commonly known as:  Oxy IR/ROXICODONE Take 1 tablet (5 mg total) by mouth every 4 (four) hours as needed (pain scale 4-7).   Prenatal Adult Gummy/DHA/FA 0.4-25 MG Chew Chew 1 tablet by mouth daily.   sodium chloride 0.65 % Soln nasal spray Commonly known as:  OCEAN Place 1 spray into both nostrils as needed for congestion.       Diet: routine diet  Activity: Advance as tolerated. Pelvic rest for 6 weeks.   Outpatient follow up:1 week Follow up Appt: Future Appointments  Date Time Provider [place]  1:00 PM DaleOfilia Valentine PR-PR None   Follow up Visit:No follow-ups on file.  Postpartum contraception: Tubal Ligation  Newborn Data:   TraiJadine, Brum[place]0[[id number]]ve born female  Birth Weight: 4 lb 12.9 oz (2180 g) APGAR: 8, 9  Newborn Delivery   Birth date/time:  08/04/2017 11:26:00 Delivery type:  Vaginal, Spontaneous      TraiAlois, Min[place]0[[id number]]ve born female  Birth Weight: 5 lb 12.4 oz (2620 g) APGAR: 9, 9  Newborn Delivery   Birth date/time:  08/04/2017 12:31:00 Delivery type:  C-Section, Low Transverse Trial of labor:  Yes C-section categorization:  Primary     Baby Feeding: Breast Disposition:home with mother   08/07/2017 [name]

## 2017-08-07 NOTE — Lactation Note (Addendum)
This note was copied from a baby's chart. Lactation Consultation Note  Patient Name: Lori Valentine Remedios Today's Date: 08/07/2017   [facility] Follow Up Visit:  G2P3 mother whose twins are now 40 hours of age.  They are 36+1 weeks and < 6 lbs.  Twins are sleeping in bassinet.  Spoke with mother about breastfeeding/bottlefeeding babies.  Mother states she wants to introduce breastmilk as soon as she gets home.  DEBP not set up in her room and I offered to set one up for her.  Mother declined stating she was too sore to begin this now.  She would not allow me to assess her nipples but stated they were rubbed raw from hand expression when she was trying to obtain colostrum drops in her plastic container.  Offered to provide comfort gels and mother accepted.  Directions given for use.   Mother is hoping to be discharged today.    I encouraged her to come in for a follow up OP visit after she arrives home for a day or two if breastfeeding concerns arise.    Mother states she is also using breast shells for comfort.  Breast shells are noted to be sitting on counter.  No further questions/concerns at this time.                  [name] 08/07/2017, 1:53 AM

## 2017-08-08 ENCOUNTER — Encounter (HOSPITAL_COMMUNITY): Payer: Self-pay

## 2017-08-08 ENCOUNTER — Encounter: Payer: Managed Care, Other (non HMO) | Admitting: Family Medicine

## 2017-08-09 ENCOUNTER — Telehealth: Payer: Self-pay | Admitting: General Practice

## 2017-08-09 ENCOUNTER — Encounter: Payer: Self-pay | Admitting: General Practice

## 2017-08-09 NOTE — Telephone Encounter (Signed)
Unable to leave message on VM.  Phone would ring and cut off.  Appointment reminders will be mailed to patient.

## 2017-08-17 ENCOUNTER — Encounter (HOSPITAL_COMMUNITY): Payer: Self-pay | Admitting: Obstetrics and Gynecology

## 2017-08-23 ENCOUNTER — Ambulatory Visit (INDEPENDENT_AMBULATORY_CARE_PROVIDER_SITE_OTHER): Payer: Managed Care, Other (non HMO) | Admitting: Obstetrics and Gynecology

## 2017-08-23 ENCOUNTER — Other Ambulatory Visit: Payer: Self-pay

## 2017-08-23 ENCOUNTER — Telehealth: Payer: Self-pay | Admitting: General Practice

## 2017-08-23 VITALS — BP 125/81 | HR 93 | Temp 98.3°F | Wt 274.0 lb

## 2017-08-23 DIAGNOSIS — Z5189 Encounter for other specified aftercare: Secondary | ICD-10-CM

## 2017-08-23 MED ORDER — SULFAMETHOXAZOLE-TRIMETHOPRIM 800-160 MG PO TABS: 1.0000 | ORAL_TABLET | Freq: Two times a day (BID) | ORAL | 0 refills | Status: AC

## 2017-08-23 NOTE — Progress Notes (Deleted)
Patient: Lori Valentine Female    DOB: 1986-03-16   32 y.o.   MRN: [id number] Visit Date: 08/23/2017  Today's Provider: Ph[facility] Nurse   Chief Complaint  Patient presents with  . Wound Check    c-section 08/04/2017  . Hypertension   Subjective:    HPI Patient presents today for blood pressure and wound check.  She delivered twins 08/04/2017 via c-section    Allergies  Allergen Reactions  . Effexor [Venlafaxine]     "Groggy, unable to concentrate"     Current Outpatient Medications:  .  acetaminophen (TYLENOL) 500 MG tablet, Take 500 mg by mouth as needed for mild pain or headache. , Disp: , Rfl:  .  Certolizumab Pegol 2 X 200 MG/ML KIT, Inject 400 mg into the skin every 28 (twenty-eight) days., Disp: 3 kit, Rfl: 0 .  ibuprofen (ADVIL,MOTRIN) 600 MG tablet, Take 1 tablet (600 mg total) by mouth every 6 (six) hours., Disp: 30 tablet, Rfl: 0 .  levothyroxine (SYNTHROID, LEVOTHROID) 50 MCG tablet, You should have the 12mg pills already. Take one 1772m pill and one 5072mpill po qday before breakfast for total dose of 225m60mer day (Patient taking differently: Take 225 mcg by mouth daily before breakfast. You should have the 175mc70mlls already. Take one 175mcg88ml and one 50mcg 33m po qday before breakfast for total dose of 225mcg p68may), Disp: 30 tablet, Rfl: 1 .  Prenatal MV & Min w/FA-DHA (PRENATAL ADULT GUMMY/DHA/FA) 0.4-25 MG CHEW, Chew 1 tablet by mouth daily., Disp: 30 tablet, Rfl: 12 .  calcium carbonate (TUMS - DOSED IN MG ELEMENTAL CALCIUM) 500 MG chewable tablet, Chew 1 tablet by mouth 2 (two) times daily as needed for indigestion or heartburn., Disp: , Rfl:  .  sodium chloride (OCEAN) 0.65 % SOLN nasal spray, Place 1 spray into both nostrils as needed for congestion., Disp: , Rfl:   Review of Systems  Social History   Tobacco Use  . Smoking status: Former Smoker    Packs/day: 0.50    Years: 10.00    Pack years: 5.00  . Smokeless tobacco: Never Used    . Tobacco comment: 10 cigarettes daily  Substance Use Topics  . Alcohol use: No    Alcohol/week: 0.0 oz    Frequency: Never   Objective:   Wt 274 lb (124.3 kg)   BMI 41.66 kg/m  Vitals:   08/23/17 1354  Weight: 274 lb (124.3 kg)     Physical Exam      Assessment & Plan:           [facility]

## 2017-08-23 NOTE — Progress Notes (Signed)
Called to evaluate wound by RN. Patient has had drainage from c-section incision that started yesterday. Patient states she has been "scrubbing" incision to keep in clean and placing Vaseline on it. Discharge has associated odor. Denies fevers. Area non-tender.   BP 125/81   Pulse 93   Temp 98.3 F (36.8 C) (Oral)   Wt 274 lb (124.3 kg)   BMI 41.66 kg/m  Gen: well-appearing, NAD Abdomen: soft, non-tender. Incision: overall healing well. Has 2 small areas (47mm) of delayed wound healing with red granulation tissue. One area with mucopurulent odorous drainage. no fluctuance.   Having some delayed wound closure. Rx for Bactrim-DS given for wound infection. Discussed wound care. Patient to return in 1 week for follow-up on wound closure.   Caryl Ada, DO OB Fellow [facility], [facility]

## 2017-08-23 NOTE — Progress Notes (Signed)
Patient presents today for blood pressure and wound check.  She delivered twins 08/04/2017 via c-section.  Blood pressure good.  Provider to assess the wound.

## 2017-08-23 NOTE — Telephone Encounter (Signed)
Patient called into front office very anxious regarding her incision. Patient states a couple days ago a "bubble" popped up on her incision. Patient states her incision was kind of red like a rash but now it is gone. Patient reports irritation and slight pain from the bubble on her incision. Patient denies drainage or fever. Patient states she works in [employer] and it kind of reminds her of an abscess. Patient states she doesn't want to wait until her appt tomorrow to be seen. Offered nurse visit appt today at 1:30p. Patient verbalized understanding & will come in then. Patient had no questions

## 2017-08-24 ENCOUNTER — Ambulatory Visit: Payer: Managed Care, Other (non HMO)

## 2017-08-25 ENCOUNTER — Other Ambulatory Visit: Payer: Self-pay | Admitting: Family Medicine

## 2017-08-30 ENCOUNTER — Ambulatory Visit: Payer: Managed Care, Other (non HMO)

## 2017-09-01 NOTE — Progress Notes (Deleted)
Office Visit Note  Patient: Lori Valentine             Date of Birth: [birth year]           MRN: [id number]             PCP: Berton Bon, MD Referring: Berton Bon, MD Visit Date: 09/15/2017 Occupation: @GUAROCC @    Subjective:  No chief complaint on file.   History of Present Illness: Lori Valentine is a 32 y.o. female ***   Activities of Daily Living:  Patient reports morning stiffness for *** {minute/hour:19697}.   Patient {ACTIONS;DENIES/REPORTS:21021675::"Denies"} nocturnal pain.  Difficulty dressing/grooming: {ACTIONS;DENIES/REPORTS:21021675::"Denies"} Difficulty climbing stairs: {ACTIONS;DENIES/REPORTS:21021675::"Denies"} Difficulty getting out of chair: {ACTIONS;DENIES/REPORTS:21021675::"Denies"} Difficulty using hands for taps, buttons, cutlery, and/or writing: {ACTIONS;DENIES/REPORTS:21021675::"Denies"}   No Rheumatology ROS completed.   PMFS History:  Patient Active Problem List   Diagnosis Date Noted  . Normal labor 08/04/2017  . Anemia affecting pregnancy 08/01/2017  . BMI 50.0-59.9, adult (HCC) 07/28/2017  . Obesity in pregnancy, antepartum 03/11/2017  . Maternal rheumatoid arthritis complicating pregnancy (HCC) 03/11/2017  . Supervision of high-risk pregnancy 02/08/2017  . 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 History of anxiety and depression 10/22/2016  . Smoker 10/22/2016  . Rheumatoid arthritis (HCC) 05/13/2015  . Hypothyroidism affecting pregnancy 07/29/2014    Past Medical History:  Diagnosis Date  . Hypothyroid   . Rheumatoid arthritis (HCC)   . 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 Schizophrenia Mother   . Bipolar disorder Mother   . Diabetes Other   . Heart disease Other    Past Surgical History:  Procedure Laterality Date  . CESAREAN SECTION N/A 08/04/2017   Procedure: CESAREAN SECTION;  Surgeon: 10/04/2017, MD;  Location[facility] BIRTHING SUITES;  Service: Obstetrics;  Laterality: N/A;  . NO PAST SURGERIES    . TUBAL LIGATION Bilateral 08/04/2017   Procedure: BILATERAL TUBAL LIGATION;  Surgeon: 10/04/2017, MD;  Location: [facility] BIRTHING SUITES;  Service: Obstetrics;  Laterality: Bilateral;   Social History   Social History Narrative   41 year old son   Lives with  Sister and nephew and son.   Works as 10 at [employer]     Objective: Vital Signs: There were no vitals taken for this visit.   Physical Exam   Musculoskeletal Exam: ***  CDAI Exam: No CDAI exam completed.    Investigation: No additional findings. CBC Latest Ref Rng & Units 08/06/2017 08/05/2017 08/04/2017  WBC 4.0 - 10.5 K/uL 11.8(H) 13.6(H) 16.7(H)  Hemoglobin 12.0 - 15.0 g/dL 10/04/2017) 6.9(LL) 8.3(L)  Hematocrit 36.0 - 46.0 % 27.3(L) 22.5(L) 26.9(L)  Platelets 150 - 400 K/uL 209 220 215   CMP Latest Ref Rng & Units 08/04/2017 08/04/2017 04/18/2017  Glucose 65 - 99 mg/dL - 04/20/2017) 79  BUN 6 - 20 mg/dL - 7  7  Creatinine 0.44 - 1.00 mg/dL 8.85 0.27 7.41(O)  Sodium 135 - 145 mmol/L - 137 136  Potassium 3.5 - 5.1 mmol/L - 4.3 3.7  Chloride 101 - 111 mmol/L - 107 104  CO2 22 - 32 mmol/L - 20(L) 24  Calcium 8.9 - 10.3 mg/dL - 8.6(L) 8.5(L)  Total Protein 6.5 - 8.1 g/dL - 6.1(L) 6.3  Total Bilirubin 0.3 - 1.2 mg/dL - 0.3 0.3  Alkaline Phos 38 - 126 U/L - 253(H) -  AST 15 - 41 U/L - 18 11  ALT 14 - 54 U/L - 12(L) 13     Imaging: No results found.  Speciality Comments: No specialty comments available.    Procedures:  No procedures  performed Allergies: Effexor [venlafaxine]   Assessment / Plan:     Visit Diagnoses: No diagnosis found.    Orders: No orders of the defined types were placed in this encounter.  No orders of the defined types were placed in this encounter.   Face-to-face time spent with patient was *** minutes. 50% of time was spent in counseling and coordination of care.  Follow-Up Instructions: No follow-ups on file.   Gearldine Bienenstock, PA-C  Note - This record has been created using Dragon software.  Chart creation errors have been sought, but may not always  have been located. Such creation errors do not reflect on  the standard of medical care.

## 2017-09-02 ENCOUNTER — Encounter: Payer: Self-pay | Admitting: Internal Medicine

## 2017-09-02 ENCOUNTER — Encounter: Payer: Self-pay | Admitting: Rheumatology

## 2017-09-05 NOTE — Progress Notes (Signed)
Office Visit Note  Patient: Lori Valentine             Date of Birth: 1985-09-04           MRN: [id number]             PCP: Berton Bon, MD Referring: Berton Bon, MD Visit Date: 09/06/2017 Occupation: @GUAROCC @    Subjective:  Pain in multiple joints   History of Present Illness: Lori Valentine is a 32 y.o. female with history seropositive rheumatoid arthritis.  Patient reports that she discontinued Cimzia when she was [redacted] weeks pregnant.  She delivered twins at 39 weeks and they are now 48 weeks old.  She states she is no longer breast feeding, and she would like to restart on xeljanz. She was previously on 5, which she discontinued prior to pregnancy. She reports she does not like self injections and would like to go back to a tablet.  She states she started flaring 2 weeks ago.  She is having pain and swelling in her bilateral feet, hands, and right wrist.  She is also having pain in the right hip. She states that 1 week ago she took 1 dose of prednisone 5 mg.  She states she has been taking Aleve and tylenol daily for pain relief.    Activities of Daily Living:  Patient reports morning stiffness for 3-4  hours.   Patient Denies nocturnal pain.  Difficulty dressing/grooming: Denies Difficulty climbing stairs: Reports Difficulty getting out of chair: Denies Difficulty using hands for taps, buttons, cutlery, and/or writing: Reports   Review of Systems  Constitutional: Positive for fatigue.  HENT: Positive for mouth dryness. Negative for mouth sores and nose dryness.   Eyes: Positive for dryness. Negative for pain and visual disturbance.  Respiratory: Negative for cough, hemoptysis, shortness of breath and difficulty breathing.   Cardiovascular: Negative for chest pain, palpitations, hypertension and swelling in legs/feet.  Gastrointestinal: Positive for constipation. Negative for blood in stool and diarrhea.  Endocrine: Negative for increased urination.    Genitourinary: Negative for painful urination.  Musculoskeletal: Positive for arthralgias, joint pain, joint swelling and morning stiffness. Negative for myalgias, muscle weakness, muscle tenderness and myalgias.  Skin: Negative for color change, pallor, rash, hair loss, nodules/bumps, skin tightness, ulcers and sensitivity to sunlight.  Allergic/Immunologic: Negative for susceptible to infections.  Neurological: Negative for dizziness, numbness, headaches and weakness.  Hematological: Negative for swollen glands.  Psychiatric/Behavioral: Negative for depressed mood and sleep disturbance. The patient is not nervous/anxious.     PMFS History:  Patient Active Problem List   Diagnosis Date Noted  . Normal labor 08/04/2017  . Anemia affecting pregnancy 08/01/2017  . BMI 50.0-59.9, adult (HCC) 07/28/2017  . Obesity in pregnancy, antepartum 03/11/2017  . Maternal rheumatoid arthritis complicating pregnancy (HCC) 03/11/2017  . Supervision of high-risk pregnancy 02/08/2017  . 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 History of anxiety and depression 10/22/2016  . Smoker 10/22/2016  . Rheumatoid arthritis (HCC) 05/13/2015  . Hypothyroidism affecting pregnancy 07/29/2014    Past Medical History:  Diagnosis Date  . Hypothyroid   . Rheumatoid arthritis (HCC)   . 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 Schizophrenia Mother   . Bipolar disorder Mother   . Diabetes Other   . Heart disease Other   . Diabetes Sister   .  Healthy Son    Past Surgical History:  Procedure Laterality Date  . CESAREAN SECTION N/A 08/04/2017   Procedure: CESAREAN SECTION;  Surgeon: Hermina Staggers, MD;  Location: [facility] BIRTHING SUITES;  Service: Obstetrics;  Laterality: N/A;  . NO PAST SURGERIES    . TUBAL LIGATION Bilateral 08/04/2017   Procedure: BILATERAL TUBAL LIGATION;  Surgeon: Hermina Staggers, MD;  Location: [facility] BIRTHING SUITES;  Service: Obstetrics;  Laterality: Bilateral;   Social History   Social History Narrative   11 year old son   Lives with  Sister and nephew and son.   Works as Sales executive at [employer]     Objective: Vital Signs: BP 117/80 (BP Location: Left Arm, Patient Position: Sitting, Cuff Size: Large)   Pulse 77   Resp 16   Ht 5\' 8"  (1.727 m)   Wt 274 lb (124.3 kg)   BMI 41.66 kg/m    Physical Exam  Constitutional: She is oriented to person, place, and time. She appears well-developed and well-nourished.  HENT:  Head: Normocephalic and atraumatic.  Eyes: Conjunctivae and EOM are normal.  Neck: Normal range of motion.  Cardiovascular: Normal rate, regular rhythm, normal heart sounds and intact distal pulses.  Pulmonary/Chest: Effort normal and breath sounds normal.  Abdominal: Soft. Bowel sounds are normal.  Lymphadenopathy:    She has no cervical adenopathy.  Neurological: She is alert and oriented to person, place, and time.  Skin: Skin is warm and dry. Capillary refill takes less than 2 seconds.  Psychiatric: She has a normal mood and affect. Her behavior is normal.  Nursing note and vitals reviewed.    Musculoskeletal Exam: C-spine limited range of motion.  Thoracic lumbar spine good range of motion.  No midline spinal tenderness.  No SI joint tenderness.  Shoulder joints, elbow joints good range of motion.  She has limited range of motion of bilateral wrists.  She has synovitis of her right wrist.  MCPs PIPs and DIPs good range of motion.  She has 95% fist formation bilaterally.  She  has synovitis of several MCP and PIP joints as described below.  Hip joints, knee joints, ankle joints, PIPs, DIPs good range of motion with no synovitis.  She has synovitis of all MTP joints.  No warmth or effusion of bilateral knee joints.  No tenderness of trochanteric bursa.  CDAI Exam: CDAI Homunculus Exam:   Tenderness:  RUE: wrist RLE: tibiotalar Right foot: 1st MTP, 2nd MTP, 3rd MTP, 4th MTP and 5th MTP Left foot: 1st MTP, 2nd MTP, 3rd MTP, 4th MTP and 5th MTP  Swelling:  RUE: wrist Right hand: 2nd MCP, 3rd MCP, 4th MCP, 2nd PIP, 3rd PIP and 4th PIP Left hand: 2nd MCP, 3rd MCP, 4th MCP, 2nd PIP and 3rd PIP Right foot: 1st MTP, 2nd MTP, 3rd MTP, 4th MTP and 5th MTP Left foot: 1st MTP, 2nd MTP, 3rd MTP, 4th MTP and 5th MTP  Joint Counts:  CDAI Tender Joint count: 1 CDAI Swollen Joint count:  12  Global Assessments:  Patient Global Assessment: 5 Provider Global Assessment: 5  CDAI Calculated Score: 23    Investigation: No additional findings. CBC Latest Ref Rng & Units 08/06/2017 08/05/2017 08/04/2017  WBC 4.0 - 10.5 K/uL 11.8(H) 13.6(H) 16.7(H)  Hemoglobin 12.0 - 15.0 g/dL 5.0(T) 6.9(LL) 8.3(L)  Hematocrit 36.0 - 46.0 % 27.3(L) 22.5(L) 26.9(L)  Platelets 150 - 400 K/uL 209 220 215   CMP Latest Ref Rng & Units 08/04/2017 08/04/2017 04/18/2017  Glucose 65 - 99 mg/dL - 888(K) 79  BUN 6 - 20 mg/dL - 7 7  Creatinine 8.00 - 1.00 mg/dL 3.49 1.79 1.50(V)  Sodium 135 - 145 mmol/L - 137 136  Potassium 3.5 - 5.1 mmol/L - 4.3 3.7  Chloride 101 - 111 mmol/L - 107 104  CO2 22 - 32 mmol/L - 20(L) 24  Calcium 8.9 - 10.3 mg/dL - 8.6(L) 8.5(L)  Total Protein 6.5 - 8.1 g/dL - 6.1(L) 6.3  Total Bilirubin 0.3 - 1.2 mg/dL - 0.3 0.3  Alkaline Phos 38 - 126 U/L - 253(H) -  AST 15 - 41 U/L - 18 11  ALT 14 - 54 U/L - 12(L) 13     Imaging: No results found.  Speciality Comments: No specialty comments available.    Procedures:  No procedures performed Allergies: Effexor [venlafaxine]      Assessment / Plan:     Visit Diagnoses: Rheumatoid arthritis involving multiple sites with positive rheumatoid factor (HCC): She is active synovitis on exam today.  She has tenderness and synovitis of her right wrist several MCP and PIP joints as well as all MTP joints.  She started having rheumatoid arthritis flare 2 weeks ago.  She discontinued Cimzia at 35 weeks of her pregnancy.  She delivered at 36 weeks and her twins are now 71 weeks old.  She would like to restart on [place] which she discontinued when she became pregnant.  She reports that she had a tubal ligation and is no longer breast-feeding.  She prefers [name] due to taking tablets since she does not like self injections.  We will reapply for [name].  Indications, contraindications, potential side effects were reviewed.  All questions were addressed.  CBC, CMP and TB gold will be ordered today.  She will come back for lipid panel when she is fasting.  A prednisone taper starting at 20 mg tapering by 5 mg every 4 days was sent to the pharmacy.  She was also given a sample of Xeljanz 11 mg XR p.o. daily.  Medication counseling: TB test: Pending Hepatitis: 05/12/11 Lipid panel: 8/301/8 SPEP: 08/02/13 Immunoglobulins: 08/02/13  Does patient have history of diverticulitis?  No  Counseled patient that [name] is a JAK inhibitor indicated for Rheumatoid Arthritis.  Counseled patient on purpose, proper use, and adverse effects of Xeljanz.  Counseled patient that medication is prescribed to take 11 mg XR daily.  Reviewed the most common adverse effects including infection, diarrhea, headaches.  Also reviewed rare adverse effects such as bowel injury and the need to contact us if she develops stomach pain during treatment.  Reviewed with patient that there is the possibility of an increased risk of malignancy but it is not well understood if this increased risk is due to the medication or the disease state.  Counseled patient to avoid live  vaccines while on [place].  Provided patient with medication education material and answered all questions.  Patient consented to [place].  Will upload [name] into patient's chart.    High  risk medication use -She would like to restart on Xeljanz XR .d/c Cimzia(Remicade IV 3mg / Kg and Enbrel inadequate response). -CBC, CMP, and TB gold were ordered today.  Plan: CBC with Differential/Platelet, COMPLETE METABOLIC PANEL WITH GFR, QuantiFERON-TB Gold Plus  Hypothyroidism affecting pregnancy in third trimester  Dichorionic diamniotic twin pregnancy in third trimester: She delivered at 59 weeks and her twins are 21 weeks old.  She has a tubal ligation and is no longer breastfeeding.   History of anxiety and depression  Anemia affecting pregnancy, antepartum    Orders: Orders Placed This Encounter  Procedures  . CBC with Differential/Platelet  . COMPLETE METABOLIC PANEL WITH GFR  . QuantiFERON-TB Gold Plus   Meds ordered this encounter  Medications  . predniSONE (DELTASONE) 5 MG tablet    Sig: Take 4 tablets by mouth for 4 days with breakfast, 3 tablets for 4 days, 2 tablets for 4 days, 1 tablet for 4 days.    Dispense:  40 tablet    Refill:  0    Face-to-face time spent with patient was 30 minutes. >50% of time was spent in counseling and coordination of care.  Follow-Up Instructions: Return in about 3 months (around 12/07/2017) for Rheumatoid arthritis.   Gearldine Bienenstock, PA-C  Note - This record has been created using Dragon software.  Chart creation errors have been sought, but may not always  have been located. Such creation errors do not reflect on  the standard of medical care.

## 2017-09-06 ENCOUNTER — Encounter: Payer: Self-pay | Admitting: Physician Assistant

## 2017-09-06 ENCOUNTER — Ambulatory Visit: Payer: 59 | Admitting: Physician Assistant

## 2017-09-06 VITALS — BP 117/80 | HR 77 | Resp 16 | Ht 68.0 in | Wt 274.0 lb

## 2017-09-06 DIAGNOSIS — O9989 Other specified diseases and conditions complicating pregnancy, childbirth and the puerperium: Secondary | ICD-10-CM

## 2017-09-06 DIAGNOSIS — O99019 Anemia complicating pregnancy, unspecified trimester: Secondary | ICD-10-CM

## 2017-09-06 DIAGNOSIS — Z79899 Other long term (current) drug therapy: Secondary | ICD-10-CM

## 2017-09-06 DIAGNOSIS — O30043 Twin pregnancy, dichorionic/diamniotic, third trimester: Secondary | ICD-10-CM

## 2017-09-06 DIAGNOSIS — O99283 Endocrine, nutritional and metabolic diseases complicating pregnancy, third trimester: Secondary | ICD-10-CM | POA: Diagnosis not present

## 2017-09-06 DIAGNOSIS — O99891 Other specified diseases and conditions complicating pregnancy: Secondary | ICD-10-CM

## 2017-09-06 DIAGNOSIS — Z8659 Personal history of other mental and behavioral disorders: Secondary | ICD-10-CM | POA: Diagnosis not present

## 2017-09-06 DIAGNOSIS — M069 Rheumatoid arthritis, unspecified: Secondary | ICD-10-CM | POA: Diagnosis not present

## 2017-09-06 DIAGNOSIS — M0579 Rheumatoid arthritis with rheumatoid factor of multiple sites without organ or systems involvement: Secondary | ICD-10-CM | POA: Diagnosis not present

## 2017-09-06 DIAGNOSIS — E039 Hypothyroidism, unspecified: Secondary | ICD-10-CM

## 2017-09-06 MED ORDER — PREDNISONE 5 MG PO TABS: ORAL_TABLET | ORAL | 0 refills | Status: DC

## 2017-09-06 NOTE — Patient Instructions (Signed)
Tofacitinib tablets What is this medicine? TOFACITINIB (TOE fa SYE ti nib) is a medicine that works on the immune system. This medicine is used to treat rheumatoid arthritis and psoriatic arthritis. This medicine may be used for other purposes; ask your health care provider or pharmacist if you have questions. COMMON BRAND NAME(S): Xeljanz What should I tell my health care provider before I take this medicine? They need to know if you have any of these conditions: -cancer -diabetes -high cholesterol -immune system problems -infection (especially a virus infection such as chickenpox, cold sores, or herpes) -kidney disease -liver disease -low blood counts, like low white cell, platelet, or red cell counts -stomach or intestine problems -an unusual or allergic reaction to tofacitinib, other medicines, foods, dyes, or preservatives -pregnant or trying to get pregnant -breast-feeding How should I use this medicine? Take this medicine by mouth with a glass of water. Follow the directions on the prescription label. You can take it with or without food. If it upsets your stomach, take it with food. A special MedGuide will be given to you by the pharmacist with each prescription and refill. Be sure to read this information carefully each time. Talk to your pediatrician regarding the use of this medicine in children. Special care may be needed. Overdosage: If you think you have taken too much of this medicine contact a poison control center or emergency room at once. NOTE: This medicine is only for you. Do not share this medicine with others. What if I miss a dose? If you miss a dose, take it as soon as you can. If it is almost time for your next dose, take only that dose. Do not take double or extra doses. What may interact with this medicine? Do not take this medicine with any of the following medications: -azathioprine, cyclosporine, or other immunosuppressive drugs -biologic medicines for  arthritis such as abatacept, adalimumab, anakinra, certolizumab, etanercept, golimumab, infliximab, rituximab, secukinumab, tocilizumab, ustekinumab -live vaccines This medicine may also interact with the following medications: -antiviral medicines for hepatitis, HIV or AIDS -certain medicines for fungal infections like fluconazole, itraconazole, ketoconazole, voriconazole -certain medicines for seizures like carbamazepine, phenobarbital, phenytoin -rifampin -supplements, such as St. John's wort This list may not describe all possible interactions. Give your health care provider a list of all the medicines, herbs, non-prescription drugs, or dietary supplements you use. Also tell them if you smoke, drink alcohol, or use illegal drugs. Some items may interact with your medicine. What should I watch for while using this medicine? Tell your doctor or healthcare professional if your symptoms do not start to get better or if they get worse. Avoid taking products that contain aspirin, acetaminophen, ibuprofen, naproxen, or ketoprofen unless instructed by your doctor. These medicines may hide a fever. Call your doctor or health care professional for advice if you get a fever, chills or sore throat, or other symptoms of a cold or flu. Do not treat yourself. This drug decreases your body's ability to fight infections. Try to avoid being around people who are sick. What side effects may I notice from receiving this medicine? Side effects that you should report to your doctor or health care professional as soon as possible: -allergic reactions like skin rash, itching or hives, swelling of the face, lips, or tongue -breathing problems -dizziness -signs of infection - fever or chills, cough, sore throat, pain or trouble passing urine -signs and symptoms of liver injury like dark yellow or brown urine; general ill feeling or flu-like   symptoms; light-colored stools; loss of appetite; nausea; right upper belly  pain; unusually weak or tired; yellowing of the eyes or skin -stomach pain or a sudden change in bowel habits -unusually weak or tired Side effects that usually do not require medical attention (report to your doctor or health care professional if they continue or are bothersome): -diarrhea -headache -muscle aches -runny nose -sinus trouble This list may not describe all possible side effects. Call your doctor for medical advice about side effects. You may report side effects to FDA at 1-800-FDA-1088. Where should I keep my medicine? Keep out of the reach of children. Store between 20 and 25 degrees C (68 and 77 degrees F). Throw away any unused medicine after the expiration date. NOTE: This sheet is a summary. It may not cover all possible information. If you have questions about this medicine, talk to your doctor, pharmacist, or health care provider.  2018 Elsevier/Gold Standard (2016-04-09 11:43:53)  

## 2017-09-07 NOTE — Progress Notes (Signed)
Anemia is improving.  Alk phos is elevated.  We will recheck alk phos in 2 months.  We will continue to monitor.

## 2017-09-08 ENCOUNTER — Ambulatory Visit (INDEPENDENT_AMBULATORY_CARE_PROVIDER_SITE_OTHER): Payer: Managed Care, Other (non HMO) | Admitting: Internal Medicine

## 2017-09-08 ENCOUNTER — Encounter: Payer: Self-pay | Admitting: Internal Medicine

## 2017-09-08 ENCOUNTER — Other Ambulatory Visit: Payer: Self-pay

## 2017-09-08 VITALS — BP 130/80 | HR 81 | Temp 98.6°F | Wt 270.6 lb

## 2017-09-08 DIAGNOSIS — D649 Anemia, unspecified: Secondary | ICD-10-CM | POA: Diagnosis not present

## 2017-09-08 DIAGNOSIS — E039 Hypothyroidism, unspecified: Secondary | ICD-10-CM | POA: Diagnosis not present

## 2017-09-08 LAB — COMPLETE METABOLIC PANEL WITH GFR
AG Ratio: 1.4 (calc) (ref 1.0–2.5)
ALKALINE PHOSPHATASE (APISO): 152 U/L — AB (ref 33–115)
ALT: 14 U/L (ref 6–29)
AST: 14 U/L (ref 10–30)
Albumin: 3.8 g/dL (ref 3.6–5.1)
BILIRUBIN TOTAL: 0.3 mg/dL (ref 0.2–1.2)
BUN: 22 mg/dL (ref 7–25)
CO2: 27 mmol/L (ref 20–32)
Calcium: 8.6 mg/dL (ref 8.6–10.2)
Chloride: 106 mmol/L (ref 98–110)
Creat: 0.56 mg/dL (ref 0.50–1.10)
GFR, Est African American: 144 mL/min/{1.73_m2} (ref >=60)
GFR, Est Non African American: 124 mL/min/{1.73_m2} (ref >=60)
GLUCOSE: 90 mg/dL (ref 65–99)
Globulin: 2.8 g/dL (calc) (ref 1.9–3.7)
POTASSIUM: 4 mmol/L (ref 3.5–5.3)
Sodium: 138 mmol/L (ref 135–146)
TOTAL PROTEIN: 6.6 g/dL (ref 6.1–8.1)

## 2017-09-08 LAB — QUANTIFERON-TB GOLD PLUS
NIL: 0.02 IU/mL
QuantiFERON-TB Gold Plus: NEGATIVE
TB1-NIL: 0.01 [IU]/mL
TB2-NIL: 0.01 [IU]/mL

## 2017-09-08 LAB — CBC WITH DIFFERENTIAL/PLATELET
BASOS PCT: 0.2 %
Basophils Absolute: 11 cells/uL (ref 0–200)
Eosinophils Absolute: 122 cells/uL (ref 15–500)
Eosinophils Relative: 2.3 %
HEMATOCRIT: 34.7 % — AB (ref 35.0–45.0)
Hemoglobin: 11.2 g/dL — ABNORMAL LOW (ref 11.7–15.5)
LYMPHS ABS: 1516 {cells}/uL (ref 850–3900)
MCH: 24.9 pg — ABNORMAL LOW (ref 27.0–33.0)
MCHC: 32.3 g/dL (ref 32.0–36.0)
MCV: 77.3 fL — AB (ref 80.0–100.0)
MPV: 9 fL (ref 7.5–12.5)
Monocytes Relative: 5.7 %
Neutro Abs: 3350 cells/uL (ref 1500–7800)
Neutrophils Relative %: 63.2 %
Platelets: 274 10*3/uL (ref 140–400)
RBC: 4.49 10*6/uL (ref 3.80–5.10)
RDW: 18.3 % — ABNORMAL HIGH (ref 11.0–15.0)
Total Lymphocyte: 28.6 %
WBC: 5.3 10*3/uL (ref 3.8–10.8)
WBCMIX: 302 {cells}/uL (ref 200–950)

## 2017-09-08 MED ORDER — FERROUS SULFATE 325 (65 FE) MG PO TABS: 325.0000 mg | ORAL_TABLET | Freq: Every day | ORAL | 2 refills | Status: DC

## 2017-09-08 NOTE — Progress Notes (Signed)
TB gold negative

## 2017-09-08 NOTE — Assessment & Plan Note (Signed)
Previously uncontrolled during pregnancy  Obtain TSH  Await results to make decision about synthroid dosing  Likely follow up in 4-6 weeks

## 2017-09-08 NOTE — Patient Instructions (Signed)
I want you to restart the iron as you are a bit low. Follow up either in 4-6 weeks for thyroid vs 2 months.

## 2017-09-08 NOTE — Progress Notes (Signed)
   [facility] Lori Chars, MD Phone: [phone]  Reason For Visit: Follow up   # Hypothyroidism  Patient currently taking 225 mcg of synthroid  Weight changes:None   Skin Changes:none Palpitations: none  Heat/Cold intolerance: none  # Weight loss  - Has been losing weight  - Has been exercising  - Been on the phetermine in the past would like to consider    # Anemia  - No palpations, no SOB, no dizziness   Past Medical History Reviewed problem list.  Medications- reviewed and updated No additions to family history Social history- patient is a non-smoker  Objective: BP 130/80   Pulse 81   Temp 98.6 F (37 C) (Oral)   Wt 270 lb 9.6 oz (122.7 kg)   SpO2 95%   BMI 41.14 kg/m  Gen: NAD, alert, cooperative with exam Neck: no thyroid enlargement  Cardio: regular rate and rhythm, S1S2 heard, no murmurs appreciated Pulm: clear to auscultation bilaterally, no wheezes, rhonchi or rales Skin: dry, intact, no rashes or lesions    Assessment/Plan: See problem based a/p  Hypothyroidism Previously uncontrolled during pregnancy  Obtain TSH  Await results to make decision about synthroid dosing  Likely follow up in 4-6 weeks   Anemia Hx of anemia, not symptomatic  - ferrous sulfate 325 (65 FE) MG tablet; Take 1 tablet (325 mg total) by mouth daily.  Dispense: 30 tablet; Refill: 2   Severe obesity (BMI >= 40) (HCC) Previously has been on phentermine, discussed with patient that given she is only 5 weeks out from delivery would not recommend starting her on a medication like this Discussed in 2 months if patient is making good progress with behavior changes then could discuss this again with new resident provider

## 2017-09-08 NOTE — Assessment & Plan Note (Signed)
Hx of anemia, not symptomatic  - ferrous sulfate 325 (65 FE) MG tablet; Take 1 tablet (325 mg total) by mouth daily.  Dispense: 30 tablet; Refill: 2

## 2017-09-08 NOTE — Assessment & Plan Note (Signed)
Previously has been on phentermine, discussed with patient that given she is only 5 weeks out from delivery would not recommend starting her on a medication like this Discussed in 2 months if patient is making good progress with behavior changes then could discuss this again with new resident provider

## 2017-09-09 LAB — TSH: TSH: 1.57 u[IU]/mL (ref 0.450–4.500)

## 2017-09-14 ENCOUNTER — Encounter: Payer: Self-pay | Admitting: Internal Medicine

## 2017-09-14 ENCOUNTER — Telehealth: Payer: Self-pay | Admitting: Internal Medicine

## 2017-09-14 NOTE — Telephone Encounter (Signed)
Called patient to let her know TSH was completely normal. Left message on phone saying so.

## 2017-09-15 ENCOUNTER — Other Ambulatory Visit: Payer: Self-pay | Admitting: Rheumatology

## 2017-09-15 ENCOUNTER — Ambulatory Visit: Payer: 59 | Admitting: Physician Assistant

## 2017-09-23 ENCOUNTER — Ambulatory Visit: Payer: Managed Care, Other (non HMO) | Admitting: Obstetrics and Gynecology

## 2017-09-28 ENCOUNTER — Other Ambulatory Visit: Payer: Self-pay | Admitting: Family Medicine

## 2017-09-28 ENCOUNTER — Encounter: Payer: Self-pay | Admitting: Rheumatology

## 2017-09-30 ENCOUNTER — Other Ambulatory Visit: Payer: Self-pay

## 2017-09-30 MED ORDER — TOFACITINIB CITRATE ER 11 MG PO TB24: 11.0000 mg | ORAL_TABLET | Freq: Every day | ORAL | 2 refills | Status: DC

## 2017-09-30 NOTE — Progress Notes (Signed)
PA has been approved through August 2019, per Sue Lush, LPN.

## 2017-11-24 NOTE — Progress Notes (Deleted)
Office Visit Note  Patient: Lori Valentine             Date of Birth: 1986/01/25           MRN: [id number]             PCP: Oralia Manis, DO Referring: Oralia Manis, DO Visit Date: 12/08/2017 Occupation: @GUAROCC @  Subjective:  No chief complaint on file.   History of Present Illness: Lori Valentine is a 32 y.o. female ***   Activities of Daily Living:  Patient reports morning stiffness for *** {minute/hour:19697}.   Patient {ACTIONS;DENIES/REPORTS:21021675::"Denies"} nocturnal pain.  Difficulty dressing/grooming: {ACTIONS;DENIES/REPORTS:21021675::"Denies"} Difficulty climbing stairs: {ACTIONS;DENIES/REPORTS:21021675::"Denies"} Difficulty getting out of chair: {ACTIONS;DENIES/REPORTS:21021675::"Denies"} Difficulty using hands for taps, buttons, cutlery, and/or writing: {ACTIONS;DENIES/REPORTS:21021675::"Denies"}  No Rheumatology ROS completed.   PMFS History:  Patient Active Problem List   Diagnosis Date Noted  . Anemia 09/08/2017  . Normal labor 08/04/2017  . Severe obesity (BMI >= 40) (HCC) 07/28/2017  . Obesity in pregnancy, antepartum 03/11/2017  . Maternal rheumatoid arthritis complicating pregnancy (HCC) 03/11/2017  . Supervision of high-risk pregnancy 02/08/2017  . 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 History of anxiety and depression 10/22/2016  . Smoker 10/22/2016  . Rheumatoid arthritis (HCC) 05/13/2015  . Hypothyroidism 07/29/2014    Past Medical History:  Diagnosis Date  . Hypothyroid   . Rheumatoid arthritis (HCC)   . 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 Schizophrenia Mother   . Bipolar disorder Mother   . Diabetes Other   . Heart disease Other   . Diabetes Sister   . Healthy Son    Past Surgical History:  Procedure Laterality Date  . CESAREAN SECTION N/A 08/04/2017   Procedure: CESAREAN SECTION;  Surgeon: 10/04/2017, MD;  Location: [facility] BIRTHING SUITES;  Service: Obstetrics;  Laterality: N/A;  . NO PAST SURGERIES    . TUBAL LIGATION Bilateral 08/04/2017   Procedure: BILATERAL TUBAL LIGATION;  Surgeon: 10/04/2017, MD;  Location: [facility] BIRTHING SUITES;  Service: Obstetrics;  Laterality: Bilateral;   Social History   Social History Narrative   42 year old son   Lives with  Sister and nephew and son.   Works as 10 at [employer]    Objective: Vital Signs: There were no vitals taken for this visit.   Physical Exam   Musculoskeletal Exam: ***  CDAI Exam: CDAI Score: Not documented Patient Global Assessment: Not documented; Provider Global Assessment: Not documented Swollen: Not documented; Tender: Not documented Joint Exam   Not documented   There is currently no information documented on the homunculus. Go to the Rheumatology activity and complete the homunculus joint exam.  Investigation: No additional findings.  Imaging: No results found.  Recent Labs: Lab Results  Component Value Date   WBC 5.3 09/06/2017   HGB 11.2 (L) 09/06/2017   PLT 274  09/06/2017   NA 138 09/06/2017   K 4.0 09/06/2017   CL 106 09/06/2017   CO2 27 09/06/2017   GLUCOSE 90 09/06/2017   BUN 22 09/06/2017   CREATININE 0.56 09/06/2017   BILITOT 0.3 09/06/2017   ALKPHOS 253 (H) 08/04/2017   AST 14 09/06/2017   ALT 14 09/06/2017   PROT 6.6 09/06/2017   ALBUMIN 2.5 (L) 08/04/2017   CALCIUM 8.6 09/06/2017   GFRAA 144 09/06/2017   QFTBGOLD Negative 07/07/2016   QFTBGOLDPLUS NEGATIVE 09/06/2017    Speciality Comments: No specialty  comments available.  Procedures:  No procedures performed Allergies: Effexor [venlafaxine]   Assessment / Plan:     Visit Diagnoses: Rheumatoid arthritis involving multiple sites with positive rheumatoid factor (HCC)  High risk medication use - Xeljanzon Cimzia during pregnancy until 35 wk  History of hypothyroidism  Anxiety and depression   Orders: No orders of the defined types were placed in this encounter.  No orders of the defined types were placed in this encounter.   Face-to-face time spent with patient was *** minutes. Greater than 50% of time was spent in counseling and coordination of care.  Follow-Up Instructions: No follow-ups on file.   Gearldine Bienenstock, PA-C  Note - This record has been created using Dragon software.  Chart creation errors have been sought, but may not always  have been located. Such creation errors do not reflect on  the standard of medical care.

## 2017-12-08 ENCOUNTER — Ambulatory Visit: Payer: 59 | Admitting: Physician Assistant

## 2017-12-14 NOTE — Progress Notes (Signed)
Office Visit Note  Patient: Lori Valentine             Date of Birth: April 01, 1986           MRN: [id number]             PCP: Oralia Manis, DO Referring: Oralia Manis, DO Visit Date: 12/28/2017 Occupation: @GUAROCC @  Subjective:  Increased joint pain.  History of Present Illness: Lori Valentine is a 32 y.o. female with history of seropositive rheumatoid arthritis.  She is taking Xeljanz 11 mg po daily.  According to patient for the last 3 months she has been on 38.  She has noticed increased symptoms in the postpartum period.  She has been experiencing increased pain in her bilateral wrist joints, bilateral hands, bilateral knee joints and her feet.  She believes that [place] has not been as effective to control her symptoms.  Activities of Daily Living:  Patient reports morning stiffness for 5 hours.   Patient Reports nocturnal pain.  Difficulty dressing/grooming: Denies Difficulty climbing stairs: Reports Difficulty getting out of chair: Reports Difficulty using hands for taps, buttons, cutlery, and/or writing: Reports  Review of Systems  Constitutional: Positive for fatigue. Negative for night sweats, weight gain and weight loss.  HENT: Negative for mouth sores, trouble swallowing, trouble swallowing, mouth dryness and nose dryness.   Eyes: Negative for pain, redness, visual disturbance and dryness.  Respiratory: Negative for cough, hemoptysis, shortness of breath and difficulty breathing.   Cardiovascular: Negative for chest pain, palpitations, hypertension, irregular heartbeat and swelling in legs/feet.  Gastrointestinal: Negative for blood in stool, constipation and diarrhea.  Endocrine: Negative for increased urination.  Genitourinary: Negative for painful urination and vaginal dryness.  Musculoskeletal: Positive for arthralgias, joint pain, joint swelling and morning stiffness. Negative for myalgias, muscle weakness, muscle tenderness and myalgias.  Skin: Negative for  color change, pallor, rash, hair loss, nodules/bumps, skin tightness, ulcers and sensitivity to sunlight.  Allergic/Immunologic: Negative for susceptible to infections.  Neurological: Negative for dizziness, numbness, headaches, memory loss, night sweats and weakness.  Hematological: Negative for swollen glands.  Psychiatric/Behavioral: Negative for depressed mood and sleep disturbance. The patient is nervous/anxious.     PMFS History:  Patient Active Problem List   Diagnosis Date Noted  . Anemia 09/08/2017  . Normal labor 08/04/2017  . Severe obesity (BMI >= 40) (HCC) 07/28/2017  . Obesity in pregnancy, antepartum 03/11/2017  . Maternal rheumatoid arthritis complicating pregnancy (HCC) 03/11/2017  . Supervision of high-risk pregnancy 02/08/2017  . 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 History of anxiety and depression 10/22/2016  . Smoker 10/22/2016  . Rheumatoid arthritis (HCC) 05/13/2015  . Hypothyroidism 07/29/2014    Past Medical History:  Diagnosis Date  . Hypothyroid   . Rheumatoid arthritis (HCC)   . 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 Schizophrenia Mother   . Bipolar  disorder Mother   . Diabetes Other   . Heart disease Other   . Diabetes Sister   . Healthy Son    Past Surgical History:  Procedure Laterality Date  . CESAREAN SECTION N/A 08/04/2017   Procedure: CESAREAN SECTION;  Surgeon: Hermina Staggers, MD;  Location: [facility] BIRTHING SUITES;  Service: Obstetrics;  Laterality: N/A;  . NO PAST  SURGERIES    . TUBAL LIGATION Bilateral 08/04/2017   Procedure: BILATERAL TUBAL LIGATION;  Surgeon: Hermina Staggers, MD;  Location: [facility] BIRTHING SUITES;  Service: Obstetrics;  Laterality: Bilateral;   Social History   Social History Narrative   33 year old son   Lives with  Sister and nephew and son.   Works as Sales executive at [employer]    Objective: Vital Signs: BP 139/87 (BP Location: Right Wrist, Patient Position: Sitting, Cuff Size: Normal)   Pulse 91   Resp 16   Ht 5\' 7"  (1.702 m)   Wt 272 lb (123.4 kg)   BMI 42.60 kg/m    Physical Exam  Constitutional: She is oriented to person, place, and time. She appears well-developed and well-nourished.  HENT:  Head: Normocephalic and atraumatic.  Eyes: Conjunctivae and EOM are normal.  Neck: Normal range of motion.  Cardiovascular: Normal rate, regular rhythm, normal heart sounds and intact distal pulses.  Pulmonary/Chest: Effort normal and breath sounds normal.  Abdominal: Soft. Bowel sounds are normal.  Lymphadenopathy:    She has no cervical adenopathy.  Neurological: She is alert and oriented to person, place, and time.  Skin: Skin is warm and dry. Capillary refill takes less than 2 seconds.  Psychiatric: She has a normal mood and affect. Her behavior is normal.  Nursing note and vitals reviewed.    Musculoskeletal Exam: C-spine thoracic lumbar spine good range of motion.  Shoulder joints elbow joints were in good range of motion.  She has synovitis in multiple joints as described below.  She has several tender joints as described below.  CDAI Exam: CDAI Score: 12.2  Patient Global Assessment: 5 (mm); Provider Global Assessment: 7 (mm) Swollen: 6 ; Tender: 18  Joint Exam      Right  Left  Glenohumeral   Tender     Wrist  Swollen Tender  Swollen Tender  MCP 3     Swollen Tender  PIP 5  Swollen Tender     Cervical Spine   Tender     Knee     Swollen Tender  Ankle  Swollen Tender     MTP 1   Tender   Tender    MTP 2   Tender   Tender  MTP 3   Tender   Tender  MTP 4   Tender   Tender  MTP 5   Tender   Tender     Investigation: No additional findings.  Imaging: No results found.  Recent Labs: Lab Results  Component Value Date   WBC 5.3 09/06/2017   HGB 11.2 (L) 09/06/2017   PLT 274 09/06/2017   NA 138 09/06/2017   K 4.0 09/06/2017   CL 106 09/06/2017   CO2 27 09/06/2017   GLUCOSE 90 09/06/2017   BUN 22 09/06/2017   CREATININE 0.56 09/06/2017   BILITOT 0.3 09/06/2017   ALKPHOS 253 (H) 08/04/2017   AST 14 09/06/2017   ALT 14 09/06/2017   PROT 6.6 09/06/2017   ALBUMIN 2.5 (L) 08/04/2017   CALCIUM 8.6 09/06/2017   GFRAA 144 09/06/2017   QFTBGOLD  Negative 07/07/2016   QFTBGOLDPLUS NEGATIVE 09/06/2017    Speciality Comments: No specialty comments available.  Procedures:  No procedures performed Allergies: Effexor [venlafaxine]   Assessment / Plan:     Visit Diagnoses: Rheumatoid arthritis involving multiple sites with positive rheumatoid factor (HCC)-patient reports having a flare for the last few months.  She is 4 months postpartum and noticing increased joint pain and joint swelling.  She switch from Cimzia to [place] ,as  [name] was more effective for her in the past.  She has significant synovitis joint joint pain on examination today.  Different treatment options were discussed.  She has taken methotrexate in the past.  She would prefer to be on oral methotrexate.  Indications side effects contraindications were discussed.  Handout was given.  Consent was taken.  She will be starting on methotrexate 8 tablets p.o. weekly along with folic acid 2 mg p.o. daily once the labs are available.  We will check labs in 2 weeks and then every 2 months to monitor for drug toxicity.  She reports taking a lot of anti-inflammatories.  I discouraged the use of NSAIDs.  Natural anti-inflammatories were discussed.  Drug Counseling  Contraception: Tubal ligation  Alcohol use:  Discussed  Patient was counseled on the purpose, proper use, and adverse effects of methotrexate including nausea, infection, and signs and symptoms of pneumonitis.  Reviewed instructions with patient to take methotrexate weekly along with folic acid daily.  Discussed the importance of frequent monitoring of kidney and liver function and blood counts, and provided patient with standing lab instructions.  Counseled patient to avoid NSAIDs and alcohol while on methotrexate.  Provided patient with educational materials on methotrexate and answered all questions.  Advised patient to get annual influenza vaccine and to get a pneumococcal vaccine if patient has not already had one.  Patient voiced understanding.  Patient consented to methotrexate use.  Will upload into chart.    High risk medication use - Xeljanz 11 mg XR p.o. daily (d/c Cimzia, Remicade, and Enbrel)  - Plan: COMPLETE METABOLIC PANEL WITH GFR, CBC with Differential/Platelet today  History of hypothyroidism  Anxiety and depression -she has increased anxiety.  Orders: Orders Placed This Encounter  Procedures  . COMPLETE METABOLIC PANEL WITH GFR  . CBC with Differential/Platelet   No orders of the defined types were placed in this encounter.   Face-to-face time spent with patient was 30 minutes. Greater than 50% of time was spent in counseling and coordination of care.  Follow-Up Instructions: Return in about 3 months (around 03/29/2018) for Rheumatoid arthritis.   Pollyann Savoy, MD  Note - This record has been created using Animal nutritionist.  Chart creation errors have been sought, but may not always  have been located. Such creation errors do not reflect on  the standard of medical care.

## 2017-12-28 ENCOUNTER — Encounter: Payer: Self-pay | Admitting: Physician Assistant

## 2017-12-28 ENCOUNTER — Telehealth: Payer: Self-pay | Admitting: Pharmacy Technician

## 2017-12-28 ENCOUNTER — Ambulatory Visit: Payer: 59 | Admitting: Rheumatology

## 2017-12-28 VITALS — BP 139/87 | HR 91 | Resp 16 | Ht 67.0 in | Wt 272.0 lb

## 2017-12-28 DIAGNOSIS — Z79899 Other long term (current) drug therapy: Secondary | ICD-10-CM | POA: Diagnosis not present

## 2017-12-28 DIAGNOSIS — F32A Depression, unspecified: Secondary | ICD-10-CM

## 2017-12-28 DIAGNOSIS — F329 Major depressive disorder, single episode, unspecified: Secondary | ICD-10-CM

## 2017-12-28 DIAGNOSIS — M0579 Rheumatoid arthritis with rheumatoid factor of multiple sites without organ or systems involvement: Secondary | ICD-10-CM

## 2017-12-28 DIAGNOSIS — F419 Anxiety disorder, unspecified: Secondary | ICD-10-CM

## 2017-12-28 DIAGNOSIS — Z8639 Personal history of other endocrine, nutritional and metabolic disease: Secondary | ICD-10-CM

## 2017-12-28 NOTE — Patient Instructions (Addendum)
Standing Labs We placed an order today for your standing lab work.    Please come back and get your standing labs in 2 weeks, then 4 weeks, then 2 months  We have open lab Monday through Friday from 8:30-11:30 AM and 1:30-4:00 PM  at the office of Dr. Pollyann Savoy.   You may experience shorter wait times on Monday and Friday afternoons. The office is located at [address], [address], [place], [place] [address] No appointment is necessary.   Labs are drawn by [facility].  You may receive a bill from [facility] for your lab work. If you have any questions regarding directions or hours of operation,  please call [phone].    Vaccines You are taking a medication(s) that can suppress your immune system.  The following immunizations are recommended: . Flu annually . Pneumonia . Shingrix . Hepatitis B  Please check with your PCP to make sure you are up to date.  Natural anti-inflammatories  You can purchase these at [employer], [employer] or online.  . Turmeric (capsules)  . Ginger (ginger root or capsules)  . Omega 3 (Fish, flax seeds, chia seeds, walnuts, almonds)  . Tart cherry (dried or extract)   Patient should be under the care of a physician while taking these supplements. This may not be reproduced without the permission of Dr. Pollyann Savoy.

## 2017-12-28 NOTE — Telephone Encounter (Signed)
Received a Prior Authorization request from patient for Xeljanz XR 11mg . Authorization has been submitted to patient's insurance via Cover My Meds. Will update once we receive a response.  Patient had insurance change.  4:34 PM , CPhT

## 2017-12-29 ENCOUNTER — Telehealth: Payer: Self-pay | Admitting: Pharmacist

## 2017-12-29 DIAGNOSIS — M0579 Rheumatoid arthritis with rheumatoid factor of multiple sites without organ or systems involvement: Secondary | ICD-10-CM

## 2017-12-29 LAB — CBC WITH DIFFERENTIAL/PLATELET
BASOS ABS: 31 {cells}/uL (ref 0–200)
Basophils Relative: 0.4 %
Eosinophils Absolute: 123 cells/uL (ref 15–500)
Eosinophils Relative: 1.6 %
HEMATOCRIT: 37.1 % (ref 35.0–45.0)
Hemoglobin: 12.5 g/dL (ref 11.7–15.5)
LYMPHS ABS: 3095 {cells}/uL (ref 850–3900)
MCH: 27.2 pg (ref 27.0–33.0)
MCHC: 33.7 g/dL (ref 32.0–36.0)
MCV: 80.8 fL (ref 80.0–100.0)
MPV: 9.6 fL (ref 7.5–12.5)
Monocytes Relative: 4.4 %
Neutro Abs: 4112 cells/uL (ref 1500–7800)
Neutrophils Relative %: 53.4 %
Platelets: 384 10*3/uL (ref 140–400)
RBC: 4.59 10*6/uL (ref 3.80–5.10)
RDW: 14.5 % (ref 11.0–15.0)
Total Lymphocyte: 40.2 %
WBC: 7.7 10*3/uL (ref 3.8–10.8)
WBCMIX: 339 {cells}/uL (ref 200–950)

## 2017-12-29 LAB — COMPLETE METABOLIC PANEL WITH GFR
AG RATIO: 1.5 (calc) (ref 1.0–2.5)
ALT: 20 U/L (ref 6–29)
AST: 16 U/L (ref 10–30)
Albumin: 4.3 g/dL (ref 3.6–5.1)
Alkaline phosphatase (APISO): 101 U/L (ref 33–115)
BUN: 22 mg/dL (ref 7–25)
CALCIUM: 9.4 mg/dL (ref 8.6–10.2)
CHLORIDE: 105 mmol/L (ref 98–110)
CO2: 27 mmol/L (ref 20–32)
Creat: 0.74 mg/dL (ref 0.50–1.10)
GFR, Est African American: 125 mL/min/{1.73_m2} (ref >=60)
GFR, Est Non African American: 108 mL/min/{1.73_m2} (ref >=60)
GLOBULIN: 2.9 g/dL (ref 1.9–3.7)
Glucose, Bld: 102 mg/dL — ABNORMAL HIGH (ref 65–99)
Potassium: 4.4 mmol/L (ref 3.5–5.3)
Sodium: 139 mmol/L (ref 135–146)
TOTAL PROTEIN: 7.2 g/dL (ref 6.1–8.1)
Total Bilirubin: 0.3 mg/dL (ref 0.2–1.2)

## 2017-12-29 MED ORDER — PREDNISONE 5 MG PO TABS: ORAL_TABLET | ORAL | 0 refills | Status: DC

## 2017-12-29 NOTE — Telephone Encounter (Signed)
Patient called inquiring about prednisone taper.  Was told one would be called in to [facility] and they said that they did not receive.  Spoke with [name] and patient is to be on prednisone taper.  Sent script to pharmacy and left patient voicemail at work.

## 2018-01-03 ENCOUNTER — Other Ambulatory Visit: Payer: Self-pay | Admitting: Pharmacist

## 2018-01-03 NOTE — Progress Notes (Unsigned)
Received a fax regarding Prior Authorization for Lori Valentine. 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  Appeal sent on patient's behalf.  Will update when we receive a response.  Will send document to scan center.  Phone# [phone]

## 2018-01-04 NOTE — Telephone Encounter (Signed)
Received message from [employer], that [employer] 11mg  XR, was approved. An approval letter with coverage dates will be sent to Md office and patient. Called patient to advise.  10:27 AM , CPhT

## 2018-01-09 NOTE — Telephone Encounter (Signed)
Received a fax from [employer] regarding a prior authorization for Xeljanz. Authorization has been APPROVED from 01/04/18 to 01/05/19.   Will send document to scan center.  Authorization #  [id number] Phone # [phone]  3:39 PM Lori Valentine Johnney Ou, CPhT

## 2018-01-17 ENCOUNTER — Encounter: Payer: Self-pay | Admitting: Rheumatology

## 2018-01-17 NOTE — Telephone Encounter (Signed)
Attempted to contact the patient and left message for patient to call the office.  

## 2018-01-18 ENCOUNTER — Telehealth: Payer: Self-pay | Admitting: Rheumatology

## 2018-01-18 MED ORDER — FOLIC ACID 1 MG PO TABS: 2.0000 mg | ORAL_TABLET | Freq: Every day | ORAL | 3 refills | Status: DC

## 2018-01-18 MED ORDER — PREDNISONE 5 MG PO TABS: ORAL_TABLET | ORAL | 0 refills | Status: DC

## 2018-01-18 MED ORDER — METHOTREXATE 2.5 MG PO TABS: 20.0000 mg | ORAL_TABLET | ORAL | 2 refills | Status: DC

## 2018-01-18 NOTE — Telephone Encounter (Signed)
Patient states she was in the office on 12/28/17 for a follow up visit. Patient states she has been on Prednisone and has decreased to 5 mg. Patient states she is having increased swelling in her left hand at the joint at her third finger. Patient states she has been taking 800 mg of Ibuprofen every evening since dropping down to Prednisone 5 mg. Patient decreased to Prednisone 5 mg on 01/16/18. Patient states she has not noticed any other joints swelling but does have pain in left knee and left ankle. Plan was to start on MTX 8 tablets at her last visit and she has not started that yet. Patient also on Xeljanz 11 mg. Patient is requesting an increase on Prednisone. Please advise.

## 2018-01-18 NOTE — Telephone Encounter (Signed)
Patient advised prescriptions Patient advised to come for lab work in 2 weeks, then 2 months, then every 3 months. Patient verbalized understanding. Patient advised it will take time for MTX to start working.

## 2018-01-18 NOTE — Telephone Encounter (Signed)
Patient was returning your call

## 2018-01-18 NOTE — Telephone Encounter (Signed)
Please send prescriptions for methotrexate 8 tablets by mouth once weekly and folic acid 2 mg daily.  Advise patient to come for lab work in 2 weeks, then 2 months, then every 3 months. Please send in a prednisone taper starting at 20 mg and taper by 5 mg every 4 days.  Please remind her that it will take time for MTX to start working.

## 2018-02-03 ENCOUNTER — Other Ambulatory Visit: Payer: Self-pay

## 2018-02-03 ENCOUNTER — Encounter: Payer: Self-pay | Admitting: Family Medicine

## 2018-02-03 ENCOUNTER — Ambulatory Visit (INDEPENDENT_AMBULATORY_CARE_PROVIDER_SITE_OTHER): Payer: 59 | Admitting: Family Medicine

## 2018-02-03 ENCOUNTER — Ambulatory Visit: Payer: Managed Care, Other (non HMO) | Admitting: Family Medicine

## 2018-02-03 VITALS — BP 132/80 | HR 73 | Temp 97.9°F | Ht 67.0 in | Wt 277.6 lb

## 2018-02-03 DIAGNOSIS — R41840 Attention and concentration deficit: Secondary | ICD-10-CM

## 2018-02-03 DIAGNOSIS — F3181 Bipolar II disorder: Secondary | ICD-10-CM

## 2018-02-03 NOTE — Assessment & Plan Note (Addendum)
Patient appears to have difficulty concentrating at home.  Along with other constellation of symptoms patient appears to be demonstrating traits of bipolar 2 disorder.  Unlikely related to patient's underlying thyroid dysfunction as she has had normal thyroid labs while having the symptoms, and patient states that her symptoms began before she was even diagnosed with hypothyroidism.  Patient likely has being hypomania as she is very functional is able to perform her job duties appropriately. Patient having symptoms of distractibility, insomnia, flight of ideas, agitation, talkativeness.  These all point to symptoms of hypomani and bipolar 2 disorder.  Patient with positive mood disorder questionnaire showing 13 yes responses in section 1, yes response in section 2, moderate problems in section 3, positive family history of manic depressive illness or bipolar disorder.  Gad 7 showing score of 19 with extreme mentally difficult.  PHQ 9 of 12+ very difficult.  Have recommended that patient be seen by psychiatry for evaluation of likely bipolar 2 disorder.  Informed that she should call to schedule this appointment.  Patient states that she would call us sooner she left clinic because she wants to be have this taken care of as soon as she can.  No SI/HI at this time.  Patient stable to be discharged home with psychiatry follow-up.  States that she will keep me posted via my chart or calling clinic to let me know when appointment is made.  Patient currently on prednisone therapy by her rheumatologist.  This is likely exacerbating the symptoms.  Patient is almost completed her taper.  Recommend that she try and avoid steroids if possible as this will exacerbate symptoms.  Strict return precautions given.

## 2018-02-03 NOTE — Progress Notes (Addendum)
Subjective:    Patient ID: Lori Valentine, female    DOB: 07/14/1985, 32 y.o.   MRN: [id number]   CC: trouble focusing  HPI: Trouble focusing Presenting today with difficulty focusing.  States that this is been a chronic problem throughout her life but she feels that physicians in the past have just "ignored her".  Have told her in the past that she is depressed but patient states that she took antidepressants, Effexor, and reacted poorly to it.  States that when she took Effexor was very groggy.  States that recently she started a new job and had twins which is increasing anxiety and causing her symptoms to worsen.  Symptoms include difficulty focusing, talking a lot (which she has been told by other physicians in family/friends in the past that she does), decreased sleep (states that she can function on 4 or less hours of sleep at night), difficulty focusing on just one task and notices she goes between different task frequently, increased anxiety, feeling overwhelmed, shaky.  States that as a child she was put on ADHD medications but discontinued this when she was 32 years old.  States that she does not believe she ever truly had ADHD and states that she was "only put on it because I was in the foster care system".  Patient states she does not feel depressed that she loves life especially now with her to new babies.  But she does state she feels overwhelmed on occasion.  States that she does feel anxious but usually has self coping mechanisms.  Reports that her major complaint is that she just cannot focus on one task.  Denies any auditory or visual hallucinations.  Does report that recently she has had increased temper especially with her son.  States she feels like she has a short fuse.  Patient is worried because she recently started a new job that she likes but is scared that her temper will compromise this job.  Patient has an unknown family history as she was in the foster care system.  States her  biological mother was under psychiatric care but she is unsure why.  Mother also had a history of drug or alcohol abuse.  Patient has no personal history of alcohol or drug use.  Does use tobacco half pack per day but this has increased with stress.   Objective:  BP 132/80   Pulse 73   Temp 97.9 F (36.6 C) (Oral)   Ht 5\' 7"  (1.702 m)   Wt 277 lb 9.6 oz (125.9 kg)   SpO2 99%   BMI 43.48 kg/m  Vitals and nursing note reviewed  General: well nourished, in no acute distress HEENT: normocephalic,  moist mucous membranes Skin: no rashes noted Neuro: alert and oriented, no focal deficits Psych: good hygiene, dressed in work scrubs, clean and neat appearance. Fast speech with decreased latency. Flight of ideas.   GAD 7 : Generalized Anxiety Score 02/03/2018 07/28/2017 07/14/2017 07/01/2017  Nervous, Anxious, on Edge 3 0 1 0  Control/stop worrying 3 0 0 1  Worry too much - different things 3 0 0 0  Trouble relaxing 3 0 1 1  Restless 3 0 0 0  Easily annoyed or irritable 3 0 0 0  Afraid - awful might happen 1 0 0 0  Total GAD 7 Score 19 0 2 2  Anxiety Difficulty Extremely difficult - - -   Depression screen [facility] 2/9 02/03/2018 02/03/2018 09/08/2017 07/28/2017 07/14/2017  Decreased Interest 0 0 0  0 0  Down, Depressed, Hopeless 0 0 0 0 0  PHQ - 2 Score 0 0 0 0 0  Altered sleeping 3 - - 0 0  Tired, decreased energy 3 - - 2 1  Change in appetite 3 - - 0 0  Feeling bad or failure about yourself  0 - - 0 0  Trouble concentrating 3 - - 0 1  Moving slowly or fidgety/restless 0 - - 0 0  Suicidal thoughts 0 - - 0 0  PHQ-9 Score 12 - - 2 2  Difficult doing work/chores Very difficult - - - -    Assessment & Plan:    Difficulty concentrating Patient appears to have difficulty concentrating at home.  Along with other constellation of symptoms patient appears to be demonstrating traits of bipolar 2 disorder.  Unlikely related to patient's underlying thyroid dysfunction as she has had normal thyroid  labs while having the symptoms, and patient states that her symptoms began before she was even diagnosed with hypothyroidism.  Patient likely has being hypomania as she is very functional is able to perform her job duties appropriately. Patient having symptoms of distractibility, insomnia, flight of ideas, agitation, talkativeness.  These all point to symptoms of hypomani and bipolar 2 disorder.  Patient with positive mood disorder questionnaire showing 13 yes responses in section 1, yes response in section 2, moderate problems in section 3, positive family history of manic depressive illness or bipolar disorder.  Gad 7 showing score of 19 with extreme mentally difficult.  PHQ 9 of 12+ very difficult.  Have recommended that patient be seen by psychiatry for evaluation of likely bipolar 2 disorder.  Informed that she should call to schedule this appointment.  Patient states that she would call us sooner she left clinic because she wants to be have this taken care of as soon as she can.  No SI/HI at this time.  Patient stable to be discharged home with psychiatry follow-up.  States that she will keep me posted via my chart or calling clinic to let me know when appointment is made.  Patient currently on prednisone therapy by her rheumatologist.  This is likely exacerbating the symptoms.  Patient is almost completed her taper.  Recommend that she try and avoid steroids if possible as this will exacerbate symptoms.  Strict return precautions given.     Discussed patient with Dr. Manson Passey  Return in about 1 week (around 02/10/2018) for Thyroid follow up.   Oralia Manis, DO, PGY-2

## 2018-02-03 NOTE — Patient Instructions (Addendum)
It was a pleasure seeing you today.   Today we discussed your issues with focus  For your focus: I recommend you be evaluated by a psychiatrist. Below is a list of physicians who can see you, since you have private insurance you can usually have more of a pick of where you want to go.   [facility]: [phone] [facility]:  [phone]   [facility] [address] [place], [place] [address] Monday - Friday: 8:30am-12:00pm / 1:00pm-2:30pm  [facility] [phone].  [facility]  [phone]  Please follow up in 1 week of thyroid or sooner if symptoms persist or worsen. Please call the clinic immediately if you have any concerns.   Our clinic's number is [phone]. Please call with questions or concerns.   Please go to the emergency room if you have thoughts of harming yourself of others.   Thank you,  Oralia Manis, DO

## 2018-02-13 ENCOUNTER — Other Ambulatory Visit: Payer: Self-pay | Admitting: Rheumatology

## 2018-02-14 ENCOUNTER — Encounter: Payer: Self-pay | Admitting: Family Medicine

## 2018-02-14 ENCOUNTER — Telehealth: Payer: Self-pay | Admitting: Family Medicine

## 2018-02-14 ENCOUNTER — Encounter: Payer: Self-pay | Admitting: Rheumatology

## 2018-02-14 MED ORDER — TOFACITINIB CITRATE ER 11 MG PO TB24: 11.0000 mg | ORAL_TABLET | Freq: Every day | ORAL | 2 refills | Status: DC

## 2018-02-14 NOTE — Addendum Note (Signed)
Addended by: Henriette Combs on: 02/14/2018 03:21 PM   Modules accepted: Orders

## 2018-02-14 NOTE — Telephone Encounter (Addendum)
Called patient to discuss recent MyChart message concerning psychiatry referral. Explained to patient that she would need evaluation and treatment by psychiatrist, it would not be an appointment to refill medications as she was told by staff at [facility]. I instructed patient that she could try [facility] walk in clinic to be seen before January.   Patient very appreciate of call. States she will go to [facility] for walk in clinic  Oralia Manis, [place], PGY-2 [facility] 02/14/2018 6:05 PM

## 2018-02-14 NOTE — Telephone Encounter (Signed)
Last Visit: 12/28/17 Next Visit: due in December 2019 Labs: 12/28/17 Glucose is 102. All labs are WNL  Okay to refill per [name]

## 2018-02-15 ENCOUNTER — Telehealth: Payer: Self-pay | Admitting: Rheumatology

## 2018-02-15 NOTE — Telephone Encounter (Signed)
Mardelle Matte from [facility] left a voicemail stating they received a prescription for Lori Valentine, however, they are no longer the preferred pharmacy under patient's [employer] plan.  The preferred pharmacy is [facility]. We will transfer this prescription over to them as a one time courtesy, but if you can note on patient's chart that going forward any specialty medication will need to be filled by [facility].  Their phone #  [phone]

## 2018-02-24 ENCOUNTER — Other Ambulatory Visit: Payer: Self-pay

## 2018-02-24 DIAGNOSIS — Z79899 Other long term (current) drug therapy: Secondary | ICD-10-CM

## 2018-02-24 LAB — COMPLETE METABOLIC PANEL WITH GFR
AG RATIO: 1.7 (calc) (ref 1.0–2.5)
ALBUMIN MSPROF: 4.3 g/dL (ref 3.6–5.1)
ALKALINE PHOSPHATASE (APISO): 91 U/L (ref 33–115)
ALT: 15 U/L (ref 6–29)
AST: 13 U/L (ref 10–30)
BILIRUBIN TOTAL: 0.2 mg/dL (ref 0.2–1.2)
BUN: 13 mg/dL (ref 7–25)
CHLORIDE: 103 mmol/L (ref 98–110)
CO2: 27 mmol/L (ref 20–32)
CREATININE: 0.54 mg/dL (ref 0.50–1.10)
Calcium: 9 mg/dL (ref 8.6–10.2)
GFR, Est African American: 146 mL/min/{1.73_m2} (ref >=60)
GFR, Est Non African American: 126 mL/min/{1.73_m2} (ref >=60)
GLOBULIN: 2.6 g/dL (ref 1.9–3.7)
Glucose, Bld: 79 mg/dL (ref 65–99)
POTASSIUM: 4.2 mmol/L (ref 3.5–5.3)
SODIUM: 135 mmol/L (ref 135–146)
Total Protein: 6.9 g/dL (ref 6.1–8.1)

## 2018-02-24 LAB — CBC WITH DIFFERENTIAL/PLATELET
BASOS ABS: 33 {cells}/uL (ref 0–200)
Basophils Relative: 0.5 %
EOS ABS: 92 {cells}/uL (ref 15–500)
Eosinophils Relative: 1.4 %
HEMATOCRIT: 37.7 % (ref 35.0–45.0)
HEMOGLOBIN: 12.8 g/dL (ref 11.7–15.5)
Lymphs Abs: 3359 cells/uL (ref 850–3900)
MCH: 28.4 pg (ref 27.0–33.0)
MCHC: 34 g/dL (ref 32.0–36.0)
MCV: 83.6 fL (ref 80.0–100.0)
MPV: 9.4 fL (ref 7.5–12.5)
Monocytes Relative: 4.8 %
NEUTROS ABS: 2798 {cells}/uL (ref 1500–7800)
NEUTROS PCT: 42.4 %
Platelets: 316 10*3/uL (ref 140–400)
RBC: 4.51 10*6/uL (ref 3.80–5.10)
RDW: 14.4 % (ref 11.0–15.0)
Total Lymphocyte: 50.9 %
WBC mixed population: 317 cells/uL (ref 200–950)
WBC: 6.6 10*3/uL (ref 3.8–10.8)

## 2018-02-27 ENCOUNTER — Telehealth: Payer: Self-pay | Admitting: Rheumatology

## 2018-02-27 NOTE — Telephone Encounter (Signed)
Vernona Rieger from [facility] left a message requesting a call back with patient insurance info, to obtain pharmacy benefit information.

## 2018-02-27 NOTE — Telephone Encounter (Signed)
Per previous notes, patient must use Lori Valentine. Called [facility], [name] was shipped to patient on 02/22/18.  2:26 PM Dorthula Nettles, CPhT

## 2018-02-27 NOTE — Progress Notes (Signed)
Labs are WNL.

## 2018-03-24 ENCOUNTER — Encounter: Payer: Self-pay | Admitting: Rheumatology

## 2018-03-27 ENCOUNTER — Telehealth: Payer: Self-pay | Admitting: *Deleted

## 2018-03-27 ENCOUNTER — Telehealth: Payer: Self-pay | Admitting: Rheumatology

## 2018-03-27 NOTE — Telephone Encounter (Signed)
Patient called stating she "is all set with her prescription of [name]."  Patient states it was the Methotrexate that she needed prescription refill sent to [facility] [address] in [place].

## 2018-03-27 NOTE — Telephone Encounter (Signed)
Left message to advise patient prescription was sent to the pharmacy on 01/18/18 for a 30 day supply with 2 additional refills. Please contact the pharmacy as you should have a refill for the month of December.

## 2018-03-27 NOTE — Telephone Encounter (Signed)
[facility] sent fax stating they have made multiple attempts to reach patient to fill Xeljanz. Unable to reach patient. Left message for patient to advise and left call back number [phone]

## 2018-04-12 ENCOUNTER — Ambulatory Visit (INDEPENDENT_AMBULATORY_CARE_PROVIDER_SITE_OTHER): Payer: 59 | Admitting: Psychiatry

## 2018-04-12 ENCOUNTER — Encounter (HOSPITAL_COMMUNITY): Payer: Self-pay | Admitting: Psychiatry

## 2018-04-12 VITALS — BP 133/83 | HR 74 | Ht 67.0 in | Wt 284.0 lb

## 2018-04-12 DIAGNOSIS — F39 Unspecified mood [affective] disorder: Secondary | ICD-10-CM

## 2018-04-12 MED ORDER — LAMOTRIGINE 25 MG PO TABS: ORAL_TABLET | ORAL | 1 refills | Status: DC
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
## 2018-04-24 ENCOUNTER — Other Ambulatory Visit: Payer: Self-pay | Admitting: Rheumatology

## 2018-04-24 NOTE — Telephone Encounter (Signed)
Please schedule patient for a follow up visit. She was due December 2019. Thanks!

## 2018-04-24 NOTE — Telephone Encounter (Signed)
Last Visit: 12/28/17  Next Visit was due in December 2019. Message sent to the front to schedule patient  Labs: 02/24/18 WNL  Okay to refill per [name]

## 2018-04-25 NOTE — Telephone Encounter (Signed)
I LMOM for patient to call, and schedule follow up appt. Patient was due for return office visit in December 2019.

## 2018-05-04 ENCOUNTER — Telehealth (HOSPITAL_COMMUNITY): Payer: Self-pay

## 2018-05-04 NOTE — Telephone Encounter (Signed)
She can try Bentyl 3 times a day.  Watch carefully for rash.  Did she get appointment for psychological testing?

## 2018-05-04 NOTE — Telephone Encounter (Signed)
Patient feels that the Lamictal is working and she would like to know if you can increase the dose. Please review and advise, thank you.

## 2018-05-10 MED ORDER — DICYCLOMINE HCL 10 MG PO CAPS: 10.0000 mg | ORAL_CAPSULE | Freq: Three times a day (TID) | ORAL | 0 refills | Status: DC

## 2018-05-10 NOTE — Telephone Encounter (Signed)
Patient was contacted and she agreed with the plan, bentyl was called into the pharmacy and she will keep her follow up

## 2018-05-15 NOTE — Progress Notes (Deleted)
Office Visit Note  Patient: Lori Valentine             Date of Birth: 02/04/1986           MRN: [id number]             PCP: Oralia ManisAbraham, Sherin, DO Referring: Oralia ManisAbraham, Sherin, DO Visit Date: 05/29/2018 Occupation: @GUAROCC @  Subjective:  No chief complaint on file.  Xeljanz XR 11 mg daily, methotrexate 8 tablets weekly, folic acid 2 mg daily.  Last TB gold negative on 09/06/2017 and will monitor yearly.  Last lipid panel showed elevated LDL on 12/02/2016.  Due for lipid panel today and will monitor yearly.  Most recent CBC/CMP within normal limits on 02/24/2018.  Due for CBC/CMP today and will monitor every 3 months.  Standing orders are in place.  History of Present Illness: Lori MaduroSandra Aerts is a 33 y.o. female ***   Activities of Daily Living:  Patient reports morning stiffness for *** {minute/hour:19697}.   Patient {ACTIONS;DENIES/REPORTS:21021675::"Denies"} nocturnal pain.  Difficulty dressing/grooming: {ACTIONS;DENIES/REPORTS:21021675::"Denies"} Difficulty climbing stairs: {ACTIONS;DENIES/REPORTS:21021675::"Denies"} Difficulty getting out of chair: {ACTIONS;DENIES/REPORTS:21021675::"Denies"} Difficulty using hands for taps, buttons, cutlery, and/or writing: {ACTIONS;DENIES/REPORTS:21021675::"Denies"}  No Rheumatology ROS completed.   PMFS History:  Patient Active Problem List   Diagnosis Date Noted  . Difficulty concentrating 02/03/2018  . Anemia 09/08/2017  . Normal labor 08/04/2017  . Severe obesity (BMI >= 40) (HCC) 07/28/2017  . Obesity in pregnancy, antepartum 03/11/2017  . Maternal rheumatoid arthritis complicating pregnancy (HCC) 03/11/2017  . Supervision of high-risk pregnancy 02/08/2017  . 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 History of anxiety and depression 10/22/2016  . Smoker 10/22/2016  . Rheumatoid arthritis (HCC) 05/13/2015  . Hypothyroidism 07/29/2014    Past Medical History:  Diagnosis Date  . Hypothyroid   . Rheumatoid arthritis (HCC)   .  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 Schizophrenia Mother   . Bipolar disorder Mother   . Diabetes Other   . Heart disease Other   . Diabetes Sister   . Healthy Son    Past Surgical History:  Procedure Laterality Date  . CESAREAN SECTION N/A 08/04/2017   Procedure: CESAREAN SECTION;  Surgeon: [name], Michael L, MD;  [facility];  Service: Obstetrics;  Laterality: N/A;  . NO PAST SURGERIES    . TUBAL LIGATION Bilateral 08/04/2017   Procedure: BILATERAL TUBAL LIGATION;  Surgeon: [name], Michael L, MD;  [facility];  Service: Obstetrics;  Laterality: Bilateral;   Social History   Social History Narrative   32 year old son   Lives with  Sister and nephew and son.   Works as Sales executivedental assistant at [employer]   Immunization History  Administered Date(s) Administered  . Influenza,inj,Quad PF,6+ Mos 05/13/2015, 12/29/2016  . Tdap 05/13/2015, 06/15/2017     Objective: Vital Signs: There were no vitals taken for  this visit.   Physical Exam   Musculoskeletal Exam: ***  CDAI Exam: CDAI Score: Not documented Patient Global Assessment: Not documented; Provider Global Assessment: Not documented Swollen: Not documented; Tender: Not documented Joint Exam   Not documented   There is currently no information documented on the homunculus. Go to the Rheumatology activity and complete the homunculus joint exam.  Investigation: No additional findings.  Imaging: No results found.  Recent Labs: Lab  Results  Component Value Date   WBC 6.6 02/24/2018   HGB 12.8 02/24/2018   PLT 316 02/24/2018   NA 135 02/24/2018   K 4.2 02/24/2018   CL 103 02/24/2018   CO2 27 02/24/2018   GLUCOSE 79 02/24/2018   BUN 13 02/24/2018   CREATININE 0.54 02/24/2018   BILITOT 0.2 02/24/2018   ALKPHOS 253 (H) 08/04/2017   AST 13 02/24/2018   ALT 15 02/24/2018   PROT 6.9 02/24/2018   ALBUMIN 2.5 (L) 08/04/2017   CALCIUM 9.0 02/24/2018   GFRAA 146 02/24/2018   QFTBGOLD Negative 07/07/2016   QFTBGOLDPLUS NEGATIVE 09/06/2017    Speciality Comments: No specialty comments available.  Procedures:  No procedures performed Allergies: Effexor [venlafaxine]   Assessment / Plan:     Visit Diagnoses: Rheumatoid arthritis involving multiple sites with positive rheumatoid factor (HCC)  High risk medication use - Xeljanz 11 mg XR p.o. daily (d/c Cimzia, Remicade, and Enbrel)   History of hypothyroidism  Anxiety and depression  Former smoker   Orders: No orders of the defined types were placed in this encounter.  No orders of the defined types were placed in this encounter.   Face-to-face time spent with patient was *** minutes. Greater than 50% of time was spent in counseling and coordination of care.  Follow-Up Instructions: No follow-ups on file.   Gearldine Bienenstock, PA-C  Note - This record has been created using Dragon software.  Chart creation errors have been sought, but may not always  have been located. Such creation errors do not reflect on  the standard of medical care.

## 2018-05-19 ENCOUNTER — Telehealth: Payer: Self-pay | Admitting: *Deleted

## 2018-05-19 NOTE — Telephone Encounter (Signed)
Received fax from [facility] they have been trying to reach patient to fill Lori Valentine and have not been successful. Left message for patient to reach them at [phone]. To set up shipment.

## 2018-05-21 ENCOUNTER — Other Ambulatory Visit: Payer: Self-pay | Admitting: Rheumatology

## 2018-05-22 ENCOUNTER — Encounter: Payer: Self-pay | Admitting: Rheumatology

## 2018-05-25 ENCOUNTER — Ambulatory Visit (HOSPITAL_COMMUNITY): Payer: 59 | Admitting: Psychiatry

## 2018-05-29 ENCOUNTER — Ambulatory Visit: Payer: 59 | Admitting: Physician Assistant

## 2018-05-29 ENCOUNTER — Encounter: Payer: Self-pay | Admitting: Rheumatology

## 2018-06-01 ENCOUNTER — Other Ambulatory Visit (HOSPITAL_COMMUNITY): Payer: Self-pay | Admitting: Psychiatry

## 2018-06-08 ENCOUNTER — Telehealth: Payer: Self-pay | Admitting: Rheumatology

## 2018-06-08 NOTE — Telephone Encounter (Signed)
Please schedule patient a follow up visit. Patient was due December 2020. Thanks!

## 2018-06-08 NOTE — Telephone Encounter (Signed)
Last Visit: 12/28/17  Next Visit was due in December 2019. Message sent to the front to schedule patient  Labs: 02/24/18 WNL  Okay to refill per [name]

## 2018-06-08 NOTE — Telephone Encounter (Signed)
LMOM for patient to call and schedule follow-up appointment.   °

## 2018-06-12 NOTE — Progress Notes (Signed)
Office Visit Note  Patient: Lori Valentine             Date of Birth: Mar 02, 1986           MRN: [id number]             PCP: Oralia Manis, DO Referring: Oralia Manis, DO Visit Date: 06/13/2018 Occupation: @GUAROCC @  Subjective:  Left foot pain.    History of Present Illness: Lori Valentine is a 33 y.o. female 3 of seropositive rheumatoid arthritis.  She has been on [place] and methotrexate on regular basis.  He denies missing any medication.  She states yesterday morning she started having pain and swelling in her right midfoot.  She states the pain was radiating to her right hip.  She had some prednisone at home which she took and it resolved her symptoms.  The pain restarted this morning and has been getting worse per patient.  None of the other joints are painful or swollen.  She states she has not had a flare in a long time.  Activities of Daily Living:  Patient reports morning stiffness for 60-90 minutes.   Patient Denies nocturnal pain.  Difficulty dressing/grooming: Denies Difficulty climbing stairs: Denies Difficulty getting out of chair: Denies Difficulty using hands for taps, buttons, cutlery, and/or writing: Denies  Review of Systems  Constitutional: Negative for fatigue, night sweats, weight gain and weight loss.  HENT: Negative for mouth sores, trouble swallowing, trouble swallowing, mouth dryness and nose dryness.   Eyes: Negative for pain, redness, visual disturbance and dryness.  Respiratory: Negative for cough, shortness of breath and difficulty breathing.   Cardiovascular: Negative for chest pain, palpitations, hypertension, irregular heartbeat and swelling in legs/feet.  Gastrointestinal: Negative for blood in stool, constipation and diarrhea.  Endocrine: Negative for increased urination.  Genitourinary: Negative for vaginal dryness.  Musculoskeletal: Positive for arthralgias, joint pain and morning stiffness. Negative for joint swelling, myalgias, muscle  weakness, muscle tenderness and myalgias.  Skin: Negative for color change, rash, hair loss, skin tightness, ulcers and sensitivity to sunlight.  Allergic/Immunologic: Negative for susceptible to infections.  Neurological: Negative for dizziness, memory loss, night sweats and weakness.  Hematological: Negative for swollen glands.  Psychiatric/Behavioral: Negative for depressed mood and sleep disturbance. The patient is nervous/anxious.     PMFS History:  Patient Active Problem List   Diagnosis Date Noted  . Difficulty concentrating 02/03/2018  . Anemia 09/08/2017  . Normal labor 08/04/2017  . Severe obesity (BMI >= 40) (HCC) 07/28/2017  . Obesity in pregnancy, antepartum 03/11/2017  . Maternal rheumatoid arthritis complicating pregnancy (HCC) 03/11/2017  . Supervision of high-risk pregnancy 02/08/2017  . 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 History of anxiety and depression 10/22/2016  . Smoker 10/22/2016  . Rheumatoid arthritis (HCC) 05/13/2015  . Hypothyroidism 07/29/2014    Past Medical History:  Diagnosis Date  . Hypothyroid   . Rheumatoid arthritis (HCC)   . 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 Schizophrenia Mother   . Bipolar disorder Mother   . Diabetes Other   . Heart disease Other   . Diabetes Sister   . Healthy Son    Past Surgical History:  Procedure Laterality Date  . CESAREAN SECTION N/A 08/04/2017   Procedure: CESAREAN SECTION;  Surgeon: [name], Michael L, MD;  [facility];  Service: Obstetrics;  Laterality: N/A;  . NO PAST SURGERIES    . TUBAL LIGATION Bilateral 08/04/2017   Procedure: BILATERAL TUBAL LIGATION;  Surgeon: [name], Michael L, MD;  [facility];  Service: Obstetrics;  Laterality: Bilateral;   Social History   Social History Narrative   32 year old son   Lives with  Sister and nephew and son.   Works as Sales executivedental assistant at [employer]   Immunization History  Administered Date(s) Administered  . Influenza,inj,Quad PF,6+ Mos 05/13/2015, 12/29/2016  . Influenza-Unspecified 01/12/2018  . Tdap 05/13/2015, 06/15/2017     Objective: Vital Signs: BP 126/83 (BP Location: Left Arm, Patient Position: Sitting, Cuff Size: Large)   Pulse 91   Resp 14   Ht 5\' 7"  (1.702 m)   Wt 289 lb (131.1 kg)   BMI 45.26 kg/m    Physical Exam Vitals signs and nursing note reviewed.  Constitutional:      Appearance: She is well-developed.  HENT:     Head: Normocephalic and atraumatic.  Eyes:     Conjunctiva/sclera: Conjunctivae normal.  Neck:     Musculoskeletal: Normal range of motion.  Cardiovascular:     Rate and Rhythm: Normal rate and regular rhythm.     Heart sounds: Normal heart sounds.  Pulmonary:     Effort: Pulmonary effort is normal.     Breath sounds: Normal breath sounds.  Abdominal:     General: Bowel sounds are normal.     Palpations: Abdomen is soft.  Lymphadenopathy:     Cervical: No cervical adenopathy.  Skin:    General: Skin is warm and dry.     Capillary Refill: Capillary refill takes less than 2 seconds.  Neurological:     Mental Status: She is alert and oriented to person, place, and time.  Psychiatric:        Behavior: Behavior normal.      Musculoskeletal Exam: C-spine thoracic lumbar spine good range of motion.  Shoulder joints elbow joints wrist joint MCPs PIPs DIPs been good range of motion with no synovitis.  She has discomfort range of motion of her right hip and right knee joint.  She is  swelling on the dorsum of her right foot.  CDAI Exam: CDAI Score: 2.2  Patient Global Assessment: 6 (mm); Provider Global Assessment: 6 (mm) Swollen: 1 ; Tender: 2  Joint Exam      Right  Left  Knee   Tender     Tarsometatarsal  Swollen Tender        Investigation: No additional findings.  Imaging: No results found.  Recent Labs: Lab Results  Component Value Date   WBC 6.6 02/24/2018   HGB 12.8 02/24/2018   PLT 316 02/24/2018   NA 135 02/24/2018   K 4.2 02/24/2018   CL 103 02/24/2018   CO2 27 02/24/2018   GLUCOSE 79 02/24/2018   BUN 13 02/24/2018   CREATININE 0.54 02/24/2018   BILITOT 0.2 02/24/2018   ALKPHOS 253 (H) 08/04/2017   AST 13 02/24/2018  ALT 15 02/24/2018   PROT 6.9 02/24/2018   ALBUMIN 2.5 (L) 08/04/2017   CALCIUM 9.0 02/24/2018   GFRAA 146 02/24/2018   QFTBGOLD Negative 07/07/2016   QFTBGOLDPLUS NEGATIVE 09/06/2017   Lab Results  Component Value Date   CHOL 211 (H) 12/02/2016   HDL 39 (L) 12/02/2016   LDLCALC 158 (H) 12/02/2016   TRIG 71 12/02/2016   CHOLHDL 5.4 (H) 12/02/2016   Speciality Comments: No specialty comments available.  Procedures:  No procedures performed Allergies: Effexor [venlafaxine]   Assessment / Plan:     Visit Diagnoses: Right foot pain-patient has been experiencing pain and swelling in her right foot since yesterday.  She has synovitis and swelling in the dorsum of her foot.  She has discomfort in her right knee and right hip as well.  None of the other joints are swollen.  Rheumatoid arthritis involving multiple sites with positive rheumatoid factor (HCC) -she is having a flare of rheumatoid arthritis.  She has not had a flare in a long time since she has been on [place] and methotrexate combination.  At this point she wants to continue on the current regimen.  A prednisone taper was given starting at 20 mg and tapering by 5 mg every 4 days.  If she has persistent flares then we may have to switch therapy.  Plan:  predniSONE (DELTASONE) 5 MG tablet.  Plan x-ray of bilateral hands and bilateral feet next visit.  High risk medication use -Xeljanz XR 11 mg daily, methotrexate 8 tablets weekly, folic acid 1 mg 2 tablets daily.  Last TB gold negative on 09/06/2017 and will monitor yearly.  Last lipid panel showed elevated LDL on 12/02/2016.  Patient needs yearly lipid panel.  Have advised her to get lipid panel with her PCP.  As she is nonfasting.  Most recent CBC/CMP within normal limits on 02/24/2018.  Due for CBC/CMP today and will monitor every 3 months.  Standing orders are in place.  Patient received flu vaccine in October.  Recommend Prevnar 13 and Pneumovax 23 as indicated.  (d/c Cimzia, Remicade, and Enbrel) - Plan: CBC with Differential/Platelet, COMPLETE METABOLIC PANEL WITH GFR  History of hypothyroidism  Anxiety and depression-she is under treatment.  Former smoker   Orders: Orders Placed This Encounter  Procedures  . CBC with Differential/Platelet  . COMPLETE METABOLIC PANEL WITH GFR   Meds ordered this encounter  Medications  . predniSONE (DELTASONE) 5 MG tablet    Sig: Take 4 tablets (20 mg total) by mouth daily for 4 days, THEN 3 tablets (15 mg total) daily for 4 days, THEN 2 tablets (10 mg total) daily for 4 days, THEN 1 tablet (5 mg total) daily for 4 days, THEN 0.5 tablets (2.5 mg total) daily for 4 days.    Dispense:  42 tablet    Refill:  0    Face-to-face time spent with patient was 30 minutes. Greater than 50% of time was spent in counseling and coordination of care.  Follow-Up Instructions: Return in about 3 months (around 09/13/2018) for Rheumatoid arthritis.   Pollyann SavoyShaili Shiann Kam, MD  Note - This record has been created using Animal nutritionistDragon software.  Chart creation errors have been sought, but may not always  have been located. Such creation errors do not reflect on  the standard of medical care.

## 2018-06-13 ENCOUNTER — Encounter: Payer: Self-pay | Admitting: Physician Assistant

## 2018-06-13 ENCOUNTER — Ambulatory Visit (INDEPENDENT_AMBULATORY_CARE_PROVIDER_SITE_OTHER): Payer: 59 | Admitting: Psychiatry

## 2018-06-13 ENCOUNTER — Ambulatory Visit: Payer: 59 | Admitting: Rheumatology

## 2018-06-13 ENCOUNTER — Encounter (HOSPITAL_COMMUNITY): Payer: Self-pay | Admitting: Psychiatry

## 2018-06-13 VITALS — BP 126/83 | HR 91 | Resp 14 | Ht 67.0 in | Wt 289.0 lb

## 2018-06-13 DIAGNOSIS — Z79899 Other long term (current) drug therapy: Secondary | ICD-10-CM | POA: Diagnosis not present

## 2018-06-13 DIAGNOSIS — Z8639 Personal history of other endocrine, nutritional and metabolic disease: Secondary | ICD-10-CM

## 2018-06-13 DIAGNOSIS — F329 Major depressive disorder, single episode, unspecified: Secondary | ICD-10-CM

## 2018-06-13 DIAGNOSIS — M79671 Pain in right foot: Secondary | ICD-10-CM | POA: Diagnosis not present

## 2018-06-13 DIAGNOSIS — F32A Depression, unspecified: Secondary | ICD-10-CM

## 2018-06-13 DIAGNOSIS — M0579 Rheumatoid arthritis with rheumatoid factor of multiple sites without organ or systems involvement: Secondary | ICD-10-CM

## 2018-06-13 DIAGNOSIS — Z87891 Personal history of nicotine dependence: Secondary | ICD-10-CM

## 2018-06-13 DIAGNOSIS — F39 Unspecified mood [affective] disorder: Secondary | ICD-10-CM | POA: Diagnosis not present

## 2018-06-13 DIAGNOSIS — F419 Anxiety disorder, unspecified: Secondary | ICD-10-CM

## 2018-06-13 LAB — CBC WITH DIFFERENTIAL/PLATELET
Absolute Monocytes: 72 cells/uL — ABNORMAL LOW (ref 200–950)
Basophils Absolute: 12 cells/uL (ref 0–200)
Basophils Relative: 0.2 %
EOS PCT: 0 %
Eosinophils Absolute: 0 cells/uL — ABNORMAL LOW (ref 15–500)
HCT: 36.8 % (ref 35.0–45.0)
Hemoglobin: 12.5 g/dL (ref 11.7–15.5)
Lymphs Abs: 1014 cells/uL (ref 850–3900)
MCH: 28.8 pg (ref 27.0–33.0)
MCHC: 34 g/dL (ref 32.0–36.0)
MCV: 84.8 fL (ref 80.0–100.0)
MPV: 9.5 fL (ref 7.5–12.5)
Monocytes Relative: 1.2 %
Neutro Abs: 4902 cells/uL (ref 1500–7800)
Neutrophils Relative %: 81.7 %
PLATELETS: 385 10*3/uL (ref 140–400)
RBC: 4.34 10*6/uL (ref 3.80–5.10)
RDW: 15.6 % — ABNORMAL HIGH (ref 11.0–15.0)
Total Lymphocyte: 16.9 %
WBC: 6 10*3/uL (ref 3.8–10.8)

## 2018-06-13 LAB — COMPLETE METABOLIC PANEL WITH GFR
AG RATIO: 1.7 (calc) (ref 1.0–2.5)
ALT: 13 U/L (ref 6–29)
AST: 15 U/L (ref 10–30)
Albumin: 4.4 g/dL (ref 3.6–5.1)
Alkaline phosphatase (APISO): 92 U/L (ref 31–125)
BUN: 13 mg/dL (ref 7–25)
CALCIUM: 9.3 mg/dL (ref 8.6–10.2)
CO2: 23 mmol/L (ref 20–32)
Chloride: 103 mmol/L (ref 98–110)
Creat: 0.64 mg/dL (ref 0.50–1.10)
GFR, EST NON AFRICAN AMERICAN: 119 mL/min/{1.73_m2} (ref >=60)
GFR, Est African American: 138 mL/min/{1.73_m2} (ref >=60)
Globulin: 2.6 g/dL (calc) (ref 1.9–3.7)
Glucose, Bld: 118 mg/dL — ABNORMAL HIGH (ref 65–99)
Potassium: 3.9 mmol/L (ref 3.5–5.3)
Sodium: 138 mmol/L (ref 135–146)
Total Bilirubin: 0.4 mg/dL (ref 0.2–1.2)
Total Protein: 7 g/dL (ref 6.1–8.1)

## 2018-06-13 MED ORDER — LAMOTRIGINE 100 MG PO TABS: 100.0000 mg | ORAL_TABLET | Freq: Every day | ORAL | 1 refills | Status: DC

## 2018-06-13 MED ORDER — PREDNISONE 5 MG PO TABS: ORAL_TABLET | ORAL | 0 refills | Status: AC
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
## 2018-06-13 NOTE — Patient Instructions (Addendum)
**  Lori Valentine can cause LDL to increase. Please have fasting lipid panel at PCP office and fax results to [phone]**    Standing Labs We placed an order today for your standing lab work.    Please come back and get your standing labs in June and then every 3 months.  We have open lab Monday through Friday from 8:30-11:30 AM and 1:30-4:00 PM  at the office of Dr. Pollyann Savoy.   You may experience shorter wait times on Monday and Friday afternoons. The office is located at [address], [address], [place], [place] [address] No appointment is necessary.   Labs are drawn by [facility].  You may receive a bill from [facility] for your lab work.  If you wish to have your labs drawn at another location, please call the office 24 hours in advance to send orders.  If you have any questions regarding directions or hours of operation,  please call [phone].   Just as a reminder please drink plenty of water prior to coming for your lab work. Thanks!

## 2018-06-22 ENCOUNTER — Telehealth: Payer: Self-pay | Admitting: *Deleted

## 2018-06-22 NOTE — Telephone Encounter (Signed)
Left message to advise patient received faxed from [facility] stating they have been trying to reach the patient to refill Xeljanz. They have made several attempts without success. Call back number [phone].  

## 2018-06-22 NOTE — Telephone Encounter (Signed)
Received faxed from [facility] stating they have been trying to reach the patient to refill Xeljanz. They have made several attempts without success. Call back number [phone]. 

## 2018-06-23 ENCOUNTER — Ambulatory Visit: Payer: 59 | Admitting: Psychology

## 2018-06-27 ENCOUNTER — Ambulatory Visit: Payer: 59 | Admitting: Psychology

## 2018-07-01 ENCOUNTER — Other Ambulatory Visit (HOSPITAL_COMMUNITY): Payer: Self-pay | Admitting: Psychiatry

## 2018-07-05 ENCOUNTER — Other Ambulatory Visit (HOSPITAL_COMMUNITY): Payer: Self-pay | Admitting: Psychiatry

## 2018-07-05 DIAGNOSIS — F39 Unspecified mood [affective] disorder: Secondary | ICD-10-CM

## 2018-07-12 ENCOUNTER — Other Ambulatory Visit: Payer: Self-pay | Admitting: Rheumatology

## 2018-07-12 NOTE — Telephone Encounter (Signed)
Last visit: 06/13/18 Next visit: 09/13/18  Labs: 06/13/18 CBC is stable. Glucose is 118. Rest of CMP WNL.  Okay to refill per [name]

## 2018-07-17 ENCOUNTER — Other Ambulatory Visit: Payer: Self-pay | Admitting: Rheumatology

## 2018-07-17 NOTE — Telephone Encounter (Signed)
Last Visit: 06/13/2018 Next Visit: 09/13/2018 Labs: 06/13/2018 CBC is stable. Glucose is 118. Rest of CMP WNL.  Okay to refill per Dr. Deveshwar.  

## 2018-07-21 ENCOUNTER — Ambulatory Visit (INDEPENDENT_AMBULATORY_CARE_PROVIDER_SITE_OTHER): Payer: 59 | Admitting: Psychology

## 2018-07-21 DIAGNOSIS — F411 Generalized anxiety disorder: Secondary | ICD-10-CM | POA: Diagnosis not present

## 2018-07-21 DIAGNOSIS — F902 Attention-deficit hyperactivity disorder, combined type: Secondary | ICD-10-CM | POA: Diagnosis not present

## 2018-07-21 DIAGNOSIS — F4010 Social phobia, unspecified: Secondary | ICD-10-CM

## 2018-07-25 ENCOUNTER — Ambulatory Visit (INDEPENDENT_AMBULATORY_CARE_PROVIDER_SITE_OTHER): Payer: 59 | Admitting: Psychiatry

## 2018-07-25 ENCOUNTER — Other Ambulatory Visit: Payer: Self-pay

## 2018-07-25 ENCOUNTER — Encounter (HOSPITAL_COMMUNITY): Payer: Self-pay | Admitting: Psychiatry

## 2018-07-25 DIAGNOSIS — F39 Unspecified mood [affective] disorder: Secondary | ICD-10-CM

## 2018-07-25 MED ORDER — LAMOTRIGINE 150 MG PO TABS: 150.0000 mg | ORAL_TABLET | Freq: Every day | ORAL | 1 refills | Status: DC
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
## 2018-08-03 ENCOUNTER — Ambulatory Visit (INDEPENDENT_AMBULATORY_CARE_PROVIDER_SITE_OTHER): Payer: 59 | Admitting: Psychology

## 2018-08-03 DIAGNOSIS — F4000 Agoraphobia, unspecified: Secondary | ICD-10-CM | POA: Diagnosis not present

## 2018-08-03 DIAGNOSIS — F902 Attention-deficit hyperactivity disorder, combined type: Secondary | ICD-10-CM | POA: Diagnosis not present

## 2018-08-03 DIAGNOSIS — F411 Generalized anxiety disorder: Secondary | ICD-10-CM | POA: Diagnosis not present

## 2018-08-07 ENCOUNTER — Other Ambulatory Visit (HOSPITAL_COMMUNITY): Payer: Self-pay | Admitting: Psychiatry

## 2018-08-07 DIAGNOSIS — F39 Unspecified mood [affective] disorder: Secondary | ICD-10-CM

## 2018-08-10 ENCOUNTER — Other Ambulatory Visit: Payer: Self-pay | Admitting: Rheumatology

## 2018-08-12 ENCOUNTER — Other Ambulatory Visit (HOSPITAL_COMMUNITY): Payer: Self-pay | Admitting: Psychiatry

## 2018-08-12 DIAGNOSIS — F39 Unspecified mood [affective] disorder: Secondary | ICD-10-CM

## 2018-08-29 ENCOUNTER — Telehealth (HOSPITAL_COMMUNITY): Payer: Self-pay | Admitting: Psychiatry

## 2018-08-29 ENCOUNTER — Other Ambulatory Visit (HOSPITAL_COMMUNITY): Payer: Self-pay

## 2018-08-29 DIAGNOSIS — F39 Unspecified mood [affective] disorder: Secondary | ICD-10-CM

## 2018-08-29 DIAGNOSIS — F900 Attention-deficit hyperactivity disorder, predominantly inattentive type: Secondary | ICD-10-CM

## 2018-08-29 MED ORDER — METHYLPHENIDATE HCL 10 MG PO TABS: 10.0000 mg | ORAL_TABLET | Freq: Every day | ORAL | 0 refills | Status: DC

## 2018-08-29 MED ORDER — LAMOTRIGINE 200 MG PO TABS: 200.0000 mg | ORAL_TABLET | Freq: Every day | ORAL | 1 refills | Status: DC

## 2018-08-29 NOTE — Telephone Encounter (Signed)
08/29/18 1:21pm Dr. Lolly Mustache,  This patient stated that she was told from you (Dr. Lolly Mustache) to call after receiving  her test results of ADHD from Dr. Forbes Cellar - since that time this patient has called several times trying to get in touch with you.  08/08/18 Left msg with Regan and call was returned Rinaldo Cloud in which the patient sent test results via email.  08/15/18 Spoke with Arlina Robes a msg was sent to you (Dr. Lolly Mustache) 08/21/18 Rinaldo Cloud called the patient 08/22/18 Patient called again Rinaldo Cloud  Dr. Lolly Mustache this patient would like to speak with you ASAP [phone]

## 2018-08-29 NOTE — Telephone Encounter (Signed)
I returned patient's phone call.  Discuss her psychological testing and potential benefit and side effects of ADD medication.  On her last visit I increase Lamictal which she believes helped but now she feels veering out.  I recommend to try Lamictal 200 mg and watch carefully for rash.  We talked about starting Vyvanse 20 mg low-dose to help her ADD symptoms however she like to check with insurance company if her insurance covers Vyvanse.  If Vyvanse does not cover we will consider Ritalin.  I recommend that she should call back and let us know about insurance.

## 2018-08-29 NOTE — Telephone Encounter (Signed)
Patient returned the call, she states that her insurance covers the Vyvanse, but the co pay is too expensive, she would like a cheaper alternative. Please review and advise, thank you

## 2018-08-29 NOTE — Telephone Encounter (Signed)
Ritalin 10 mg to take in the morning called into her pharmacy.  Please inform patient that if she started to notice change in her behavior then she should call us.

## 2018-08-29 NOTE — Progress Notes (Unsigned)
Patient called to let you know that Vyvanse is covered, but co pay is too expensive. She would like to try an alternate. Please review and advise, thank you

## 2018-08-29 NOTE — Telephone Encounter (Signed)
I have left 2 messages in her voicemail.  If she call back she need to give [place] a specific time so we can contact her.

## 2018-08-31 ENCOUNTER — Other Ambulatory Visit (HOSPITAL_COMMUNITY): Payer: Self-pay | Admitting: Psychiatry

## 2018-08-31 DIAGNOSIS — F39 Unspecified mood [affective] disorder: Secondary | ICD-10-CM

## 2018-08-31 NOTE — Progress Notes (Deleted)
Office Visit Note  Patient: Lori Valentine             Date of Birth: 09-18-1985           MRN: [id number]             PCP: Oralia Manis, DO Referring: Oralia Manis, DO Visit Date: 09/13/2018 Occupation: @GUAROCC @  Subjective:  No chief complaint on file.   She is on Xeljanz XR 11 mg daily, methotrexate 8 tablets every 7 days, and folic acid 1 mg 2 tablets daily.  Last TB gold negative on 09/06/2017.  TB Gold ordered for today will monitor yearly. Last lipid panel showed elevated LDL on 12/02/2016 and advised at last appointment to have PCP monitor.  Most recent CBC stable and CMP within normal limits on 06/13/2018.  Due for CBC/CMP today and will monitor every 3 months.  Standing orders are in place. Patient received flu vaccine in October.  Recommend Prevnar 13 and Pneumovax 23 as indicated.  History of Present Illness: Lori Valentine is a 33 y.o. female ***   Activities of Daily Living:  Patient reports morning stiffness for *** {minute/hour:19697}.   Patient {ACTIONS;DENIES/REPORTS:21021675::"Denies"} nocturnal pain.  Difficulty dressing/grooming: {ACTIONS;DENIES/REPORTS:21021675::"Denies"} Difficulty climbing stairs: {ACTIONS;DENIES/REPORTS:21021675::"Denies"} Difficulty getting out of chair: {ACTIONS;DENIES/REPORTS:21021675::"Denies"} Difficulty using hands for taps, buttons, cutlery, and/or writing: {ACTIONS;DENIES/REPORTS:21021675::"Denies"}  No Rheumatology ROS completed.   PMFS History:  Patient Active Problem List   Diagnosis Date Noted  . Difficulty concentrating 02/03/2018  . Anemia 09/08/2017  . Normal labor 08/04/2017  . Severe obesity (BMI >= 40) (HCC) 07/28/2017  . Obesity in pregnancy, antepartum 03/11/2017  . Maternal rheumatoid arthritis complicating pregnancy (HCC) 03/11/2017  . Supervision of high-risk pregnancy 02/08/2017  . 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 History of anxiety and depression 10/22/2016  . Smoker 10/22/2016  .  Rheumatoid arthritis (HCC) 05/13/2015  . Hypothyroidism 07/29/2014    Past Medical History:  Diagnosis Date  . Hypothyroid   . Rheumatoid arthritis (HCC)   . 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 Schizophrenia Mother   . Bipolar disorder Mother   . Diabetes Other   . Heart disease Other   . Diabetes Sister   . Healthy Son    Past Surgical History:  Procedure Laterality Date  . CESAREAN SECTION N/A 08/04/2017   Procedure: CESAREAN SECTION;  Surgeon: Hermina Staggers, MD;  Location: [facility] BIRTHING SUITES;  Service: Obstetrics;  Laterality: N/A;  . NO PAST SURGERIES    . TUBAL LIGATION Bilateral 08/04/2017   Procedure: BILATERAL TUBAL LIGATION;  Surgeon: Hermina Staggers, MD;  Location: [facility] BIRTHING SUITES;  Service: Obstetrics;  Laterality: Bilateral;   Social History   Social History Narrative   10 year old son   Lives with  Sister and nephew and son.   Works as Sales executive at [employer]  History  Administered Date(s) Administered  . Influenza,inj,Quad PF,6+ Mos 05/13/2015, 12/29/2016  . Influenza-Unspecified 01/12/2018  . Tdap 05/13/2015, 06/15/2017     Objective: Vital Signs: There were no vitals taken for this visit.   Physical Exam   Musculoskeletal Exam: ***  CDAI Exam: CDAI Score: Not documented Patient Global Assessment: Not documented; Provider Global Assessment: Not documented Swollen: Not documented; Tender: Not documented Joint Exam   Not documented   There is currently no  information documented on the homunculus. Go to the Rheumatology activity and complete the homunculus joint exam.  Investigation: No additional findings.  Imaging: No results found.  Recent Labs: Lab Results  Component Value Date   WBC 6.0 06/13/2018   HGB 12.5 06/13/2018   PLT 385 06/13/2018   NA 138 06/13/2018   K 3.9 06/13/2018   CL 103 06/13/2018   CO2 23 06/13/2018   GLUCOSE 118 (H) 06/13/2018   BUN 13 06/13/2018   CREATININE 0.64 06/13/2018   BILITOT 0.4 06/13/2018   ALKPHOS 253 (H) 08/04/2017   AST 15 06/13/2018   ALT 13 06/13/2018   PROT 7.0 06/13/2018   ALBUMIN 2.5 (L) 08/04/2017   CALCIUM 9.3 06/13/2018   GFRAA 138 06/13/2018   QFTBGOLD Negative 07/07/2016   QFTBGOLDPLUS NEGATIVE 09/06/2017    Speciality Comments: No specialty comments available.  Procedures:  No procedures performed Allergies: Effexor [venlafaxine]   Assessment / Plan:     Visit Diagnoses: No diagnosis found.   Orders: No orders of the defined types were placed in this encounter.  No orders of the defined types were placed in this encounter.   Face-to-face time spent with patient was *** minutes. Greater than 50% of time was spent in counseling and coordination of care.  Follow-Up Instructions: No follow-ups on file.   Ellen Henri, CMA  Note - This record has been created using Animal nutritionist.  Chart creation errors have been sought, but may not always  have been located. Such creation errors do not reflect on  the standard of medical care.

## 2018-09-11 ENCOUNTER — Encounter: Payer: Self-pay | Admitting: Rheumatology

## 2018-09-12 ENCOUNTER — Other Ambulatory Visit: Payer: Self-pay | Admitting: Rheumatology

## 2018-09-12 NOTE — Telephone Encounter (Signed)
Last Visit: 06/13/2018 Next Visit: 09/13/2018 Labs: 06/13/2018 CBC is stable. Glucose is 118. Rest of CMP WNL.  Okay to refill per Dr. Estanislado Pandy.

## 2018-09-13 ENCOUNTER — Ambulatory Visit: Payer: Self-pay | Admitting: Rheumatology

## 2018-09-24 ENCOUNTER — Other Ambulatory Visit (HOSPITAL_COMMUNITY): Payer: Self-pay | Admitting: Psychiatry

## 2018-09-24 DIAGNOSIS — F39 Unspecified mood [affective] disorder: Secondary | ICD-10-CM

## 2018-09-25 ENCOUNTER — Telehealth (HOSPITAL_COMMUNITY): Payer: Self-pay | Admitting: Psychiatry

## 2018-09-26 ENCOUNTER — Other Ambulatory Visit: Payer: Self-pay | Admitting: Rheumatology

## 2018-09-26 ENCOUNTER — Ambulatory Visit (INDEPENDENT_AMBULATORY_CARE_PROVIDER_SITE_OTHER): Payer: 59 | Admitting: Psychiatry

## 2018-09-26 ENCOUNTER — Other Ambulatory Visit: Payer: Self-pay

## 2018-09-26 ENCOUNTER — Encounter (HOSPITAL_COMMUNITY): Payer: Self-pay | Admitting: Psychiatry

## 2018-09-26 DIAGNOSIS — F900 Attention-deficit hyperactivity disorder, predominantly inattentive type: Secondary | ICD-10-CM | POA: Diagnosis not present

## 2018-09-26 DIAGNOSIS — F39 Unspecified mood [affective] disorder: Secondary | ICD-10-CM

## 2018-09-26 MED ORDER — METHYLPHENIDATE HCL 20 MG PO TABS: 20.0000 mg | ORAL_TABLET | Freq: Every day | ORAL | 0 refills | Status: DC

## 2018-09-26 MED ORDER — LAMOTRIGINE 100 MG PO TABS: 100.0000 mg | ORAL_TABLET | Freq: Every day | ORAL | 1 refills | Status: DC
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
## 2018-09-27 ENCOUNTER — Other Ambulatory Visit: Payer: Self-pay | Admitting: Rheumatology

## 2018-10-08 NOTE — Progress Notes (Deleted)
   Subjective:    Patient ID: Lori Valentine, female    DOB: [birth year], 33 y.o.   MRN: [id number]   CC:  HPI: Thyroid dysfunction  H/o of hypothyroidism. No meds on med list but previous note states synthroid 225mg .   Smoking status reviewed  Review of Systems   Objective:  There were no vitals taken for this visit. Vitals and nursing note reviewed  General: well nourished, in no acute distress HEENT: normocephalic, TM's visualized bilaterally, no scleral icterus or conjunctival pallor, no nasal discharge, moist mucous membranes, good dentition without erythema or discharge noted in posterior oropharynx Neck: supple, non-tender, without lymphadenopathy Cardiac: RRR, clear S1 and S2, no murmurs, rubs, or gallops Respiratory: clear to auscultation bilaterally, no increased work of breathing Abdomen: soft, nontender, nondistended, no masses or organomegaly. Bowel sounds present Extremities: no edema or cyanosis. Warm, well perfused. 2+ radial and PT pulses bilaterally Skin: warm and dry, no rashes noted Neuro: alert and oriented, no focal deficits   Assessment & Plan:    No problem-specific Assessment & Plan notes found for this encounter.    No follow-ups on file.   Caroline More, DO, PGY-3

## 2018-10-09 ENCOUNTER — Ambulatory Visit: Payer: Medicaid Other | Admitting: Family Medicine

## 2018-10-12 ENCOUNTER — Telehealth: Payer: Self-pay | Admitting: Pharmacist

## 2018-10-12 IMAGING — US US MFM OB FOLLOW-UP EACH ADDL GEST (MODIFY)
1 series · 12 of 28 positions shown · non-contrast
Comparison: none

[Series 1: us mfm ob follow-up each addl gest (modify) · 52 acquisitions, 12 frames shown]
[im 2/52]
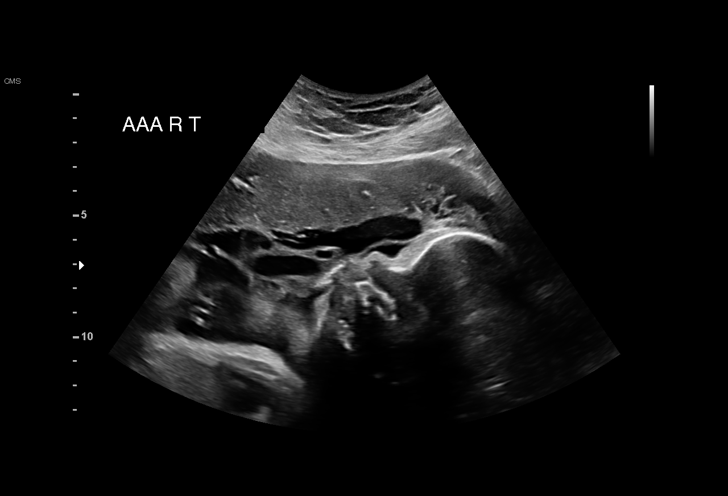
[im 6/52]
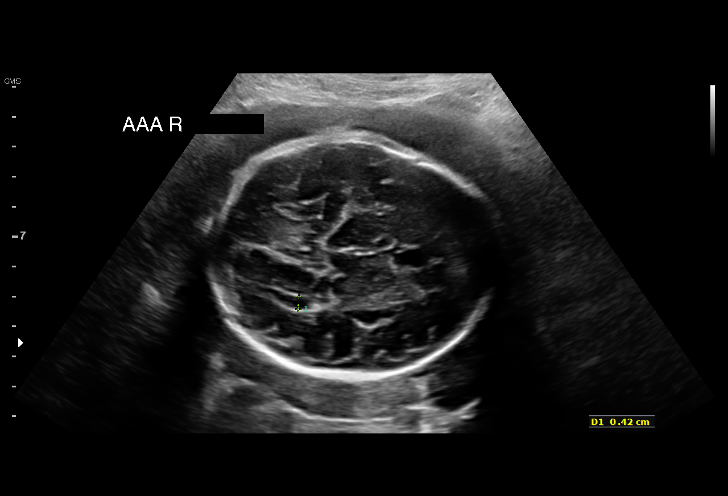
[im 10/52]
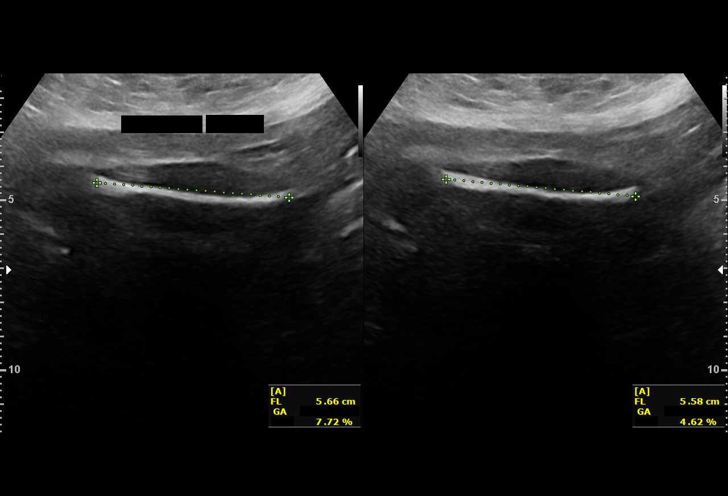
[im 16/52]
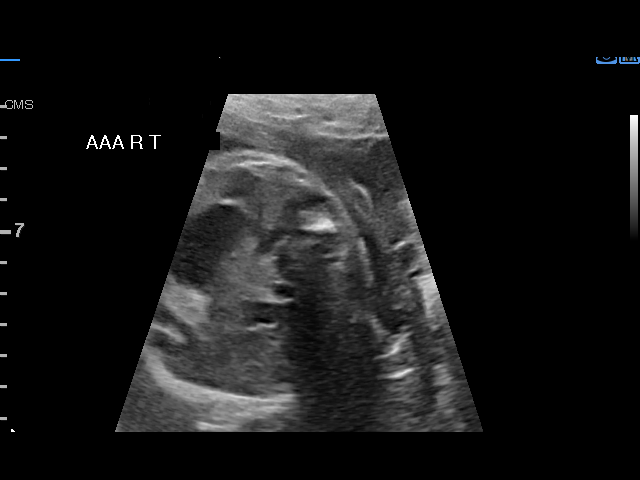
[im 19/52]
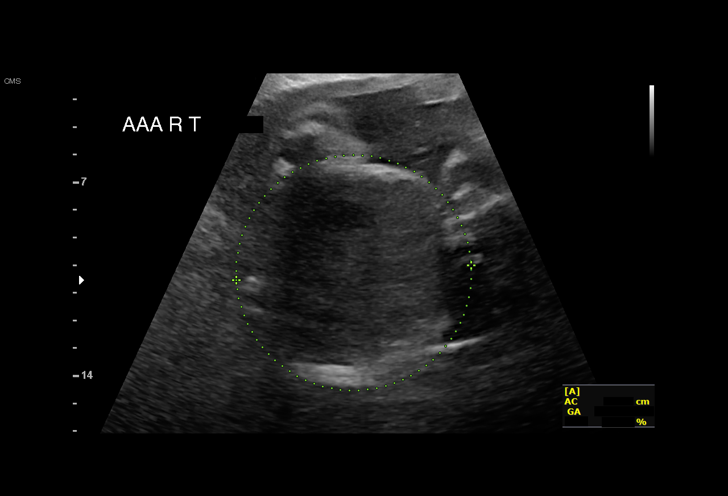
[im 23/52]
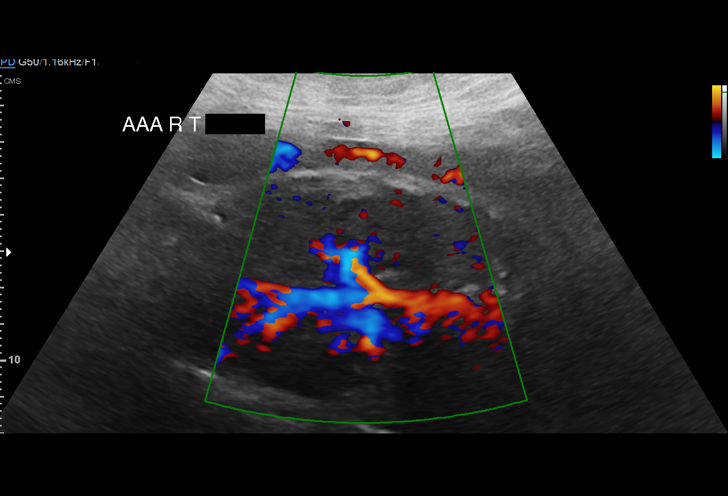
[im 29/52]
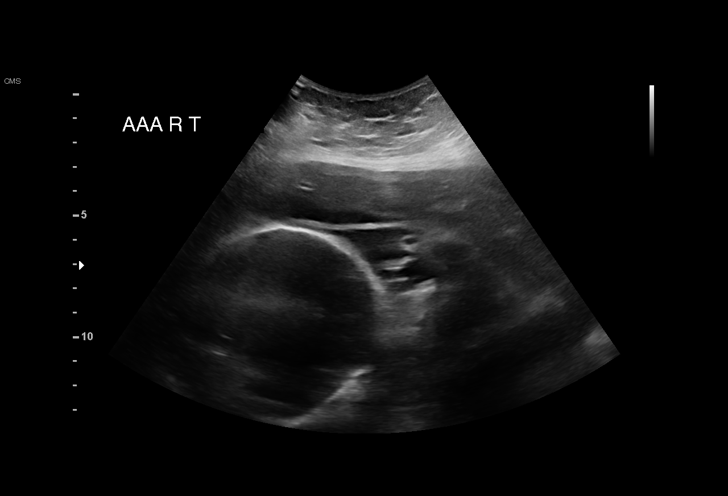
[im 33/52]
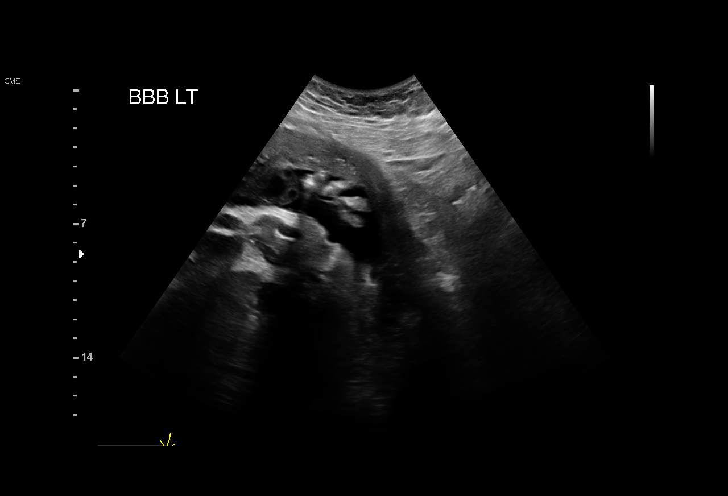
[im 36/52]
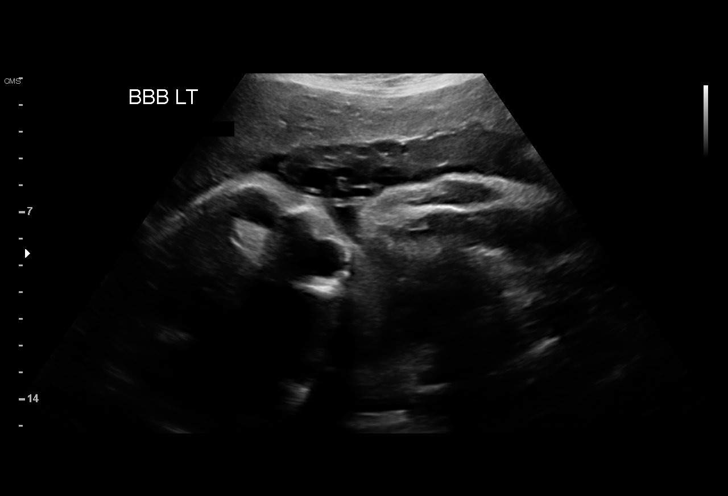
[im 42/52]
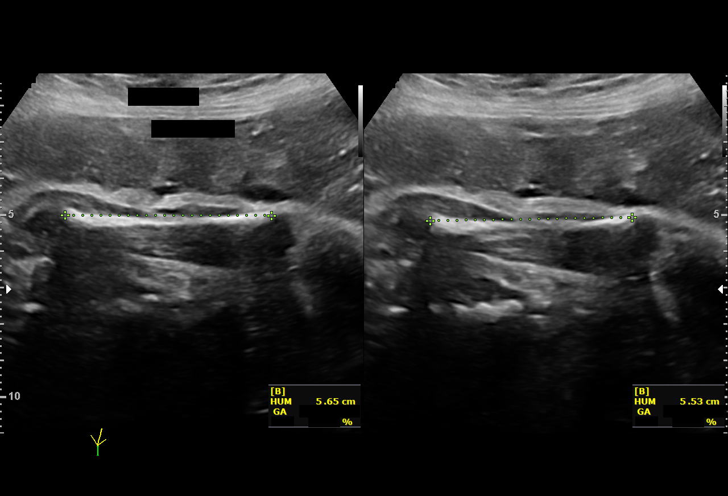
[im 46/52]
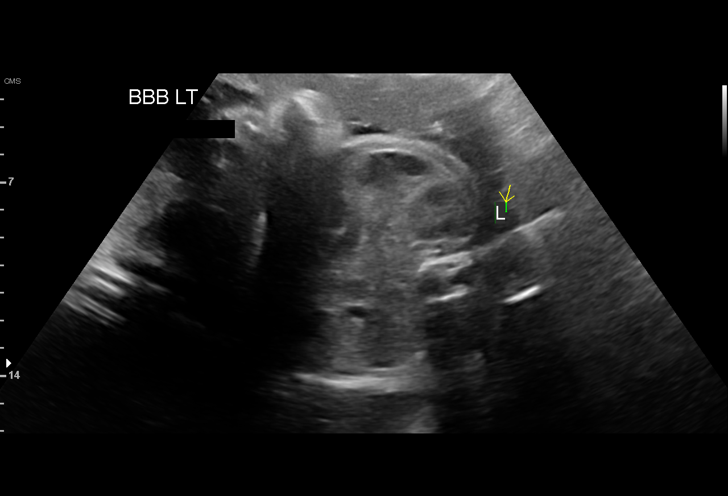
[im 50/52]
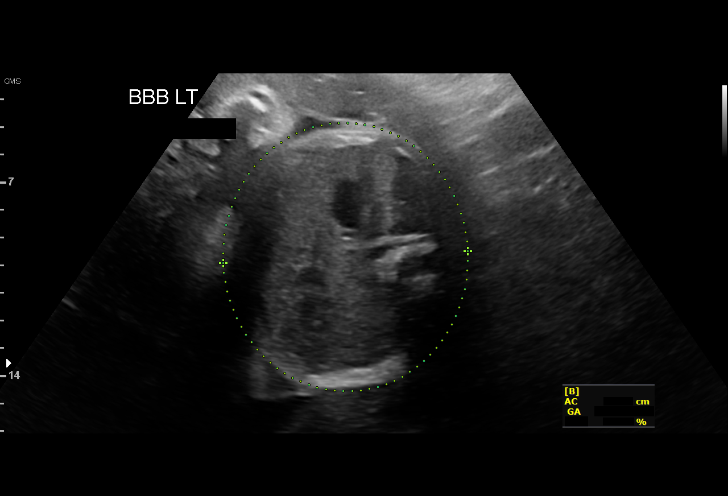

[12 of 28 positions shown; findings below may reference images not displayed]

1  SHAYLA LOISEAU            [id number]      [id number]     [id number]
2  SHAYLA LOISEAU            [id number]      [id number]     [id number]
Indications

31 weeks gestation of pregnancy
Encounter for other antenatal screening
follow-up
Twin pregnancy, di/di, third trimester
Hypothyroid (Synthroid)
Obesity complicating pregnancy, third
trimester
OB History

Blood Type:            Height:  5'8"   Weight (lb):  255       BMI:
Gravidity:    2         Term:   1
Living:       1
Fetal Evaluation (Fetus A)

Num Of Fetuses:     2
Fetal Heart         149
Rate(bpm):
Cardiac Activity:   Observed
Fetal Lie:          Maternal right side
Presentation:       Cephalic
Placenta:           Posterior, above cervical os
P. Cord Insertion:  Previously Visualized

Amniotic Fluid
AFI FV:      Subjectively within normal limits

Largest Pocket(cm)
3.46
Biometry (Fetus A)

BPD:      79.2  mm     G. Age:  31w 5d         60  %    CI:        74.79   %    70 - 86
FL/HC:      19.3   %    19.3 -
HC:      290.6  mm     G. Age:  32w 0d         37  %    HC/AC:      1.08        0.96 -
AC:      268.9  mm     G. Age:  31w 0d         43  %    FL/BPD:     71.0   %    71 - 87
FL:       56.2  mm     G. Age:  29w 4d          7  %    FL/AC:      20.9   %    20 - 24
HUM:      50.8  mm     G. Age:  29w 5d         22  %

Est. FW:    8700  gm      3 lb 9 oz     46  %     FW Discordancy        23  %
Gestational Age (Fetus A)

LMP:           32w 1d        Date:  11/17/16                 EDD:   08/24/17
U/S Today:     31w 1d                                        EDD:   08/31/17
Best:          31w 1d     Det. By:  Early Ultrasound         EDD:   08/31/17
(01/21/17)
Anatomy (Fetus A)

Cranium:               Appears normal         Aortic Arch:            Previously seen
Cavum:                 Appears normal         Ductal Arch:            Previously seen
Ventricles:            Appears normal         Diaphragm:              Previously seen
Choroid Plexus:        Appears normal         Stomach:                Appears normal, left
sided
Cerebellum:            Previously seen        Abdomen:                Appears normal
Posterior Fossa:       Previously seen        Abdominal Wall:         Previously seen
Nuchal Fold:           Previously seen        Cord Vessels:           Previously seen
Face:                  Appears normal         Kidneys:                Appear normal
(orbits and profile)
Lips:                  Not well visualized    Bladder:                Appears normal
Thoracic:              Appears normal         Spine:                  Previously seen
Heart:                 Previously seen        Upper Extremities:      Previously seen
RVOT:                  Not well visualized    Lower Extremities:      Previously seen
LVOT:                  Appears normal

Other:  Fetus appears to be a female. Technically difficult due to fetal position.

Fetal Evaluation (Fetus B)

Num Of Fetuses:     2
Fetal Heart         158
Rate(bpm):
Cardiac Activity:   Observed
Fetal Lie:          Maternal left side
Presentation:       Breech
Placenta:           Anterior, above cervical os
P. Cord Insertion:  Previously Visualized

Amniotic Fluid
AFI FV:      Subjectively within normal limits

Largest Pocket(cm)
4.3
Biometry (Fetus B)

BPD:      80.5  mm     G. Age:  32w 2d         75  %    CI:        70.88   %    70 - 86
FL/HC:      20.5   %    19.3 -
HC:      304.7  mm     G. Age:  33w 6d         85  %    HC/AC:      1.03        0.96 -
AC:      294.4  mm     G. Age:  33w 3d         95  %    FL/BPD:     77.6   %    71 - 87
FL:       62.5  mm     G. Age:  32w 2d         70  %    FL/AC:      21.2   %    20 - 24
HUM:      55.9  mm     G. Age:  32w 4d         79  %

Est. FW:    0440  gm    4 lb 10 oz      82  %     FW Discordancy     0 \ 23 %
Gestational Age (Fetus B)

LMP:           32w 1d        Date:  11/17/16                 EDD:   08/24/17
U/S Today:     33w 0d                                        EDD:   08/18/17
Best:          31w 1d     Det. By:  Early Ultrasound         EDD:   08/31/17
(01/21/17)
Anatomy (Fetus B)

Cranium:               Appears normal         Aortic Arch:            Previously seen
Cavum:                 Previously seen        Ductal Arch:            Previously seen
Ventricles:            Appears normal         Diaphragm:              Previously seen
Choroid Plexus:        Previously seen        Stomach:                Appears normal, left
sided
Cerebellum:            Previously seen        Abdomen:                Appears normal
Posterior Fossa:       Previously seen        Abdominal Wall:         Previously seen
Nuchal Fold:           Previously seen        Cord Vessels:           Previously seen
Face:                  Orbits and profile     Kidneys:                Appear normal
previously seen
Lips:                  Previously seen        Bladder:                Appears normal
Thoracic:              Appears normal         Spine:                  Previously seen
Heart:                 Previously seen        Upper Extremities:      Previously seen
RVOT:                  Previously seen        Lower Extremities:      Previously seen
LVOT:                  Previously seen

Other:  Fetus appears to be a male. Heels prev. visualized. Right 5th prev.
visualized.
Impression

Dichorionic/diamniotic twin pregnancy at 31+1 weeks
Twin A:
Presentation iscephalic
Interval review of fetal anatomy was normal
Normal amniotic fluid volume
Appropriate interval growth with EFW at the 46th percentile
Twin B:
Presentation is breech
Interval review of fetal anatomy was normal
Normal amniotic fluid volume
Appropriate interval growth with EFW at the 82nd percentile
Dicordance is 23%
Recommendations

Recommend follow-up ultrasound examination in 4 weeks for
growth evaluation
the elevated discordance is not worrisome in the situation of
a normally grown twin and a moderately large one

## 2018-10-12 NOTE — Telephone Encounter (Addendum)
Received a Prior Authorization request from [facility] for [facility]. Authorization has been submittef to patient's insurance via Covermymeds. Will update once we receive a response.

## 2018-10-17 ENCOUNTER — Encounter: Payer: Self-pay | Admitting: Pharmacist

## 2018-10-17 NOTE — Telephone Encounter (Signed)
Received a fax regarding Prior Authorization from [facility] for [facility]. Authorization has been DENIED because no documentation of positive clinical response.  Will send document to scan center.   Will send an appeal letter with supportive medical documentation.

## 2018-10-22 ENCOUNTER — Other Ambulatory Visit (HOSPITAL_COMMUNITY): Payer: Self-pay | Admitting: Psychiatry

## 2018-10-22 DIAGNOSIS — F39 Unspecified mood [affective] disorder: Secondary | ICD-10-CM

## 2018-10-24 ENCOUNTER — Telehealth (HOSPITAL_COMMUNITY): Payer: Self-pay

## 2018-10-24 DIAGNOSIS — F900 Attention-deficit hyperactivity disorder, predominantly inattentive type: Secondary | ICD-10-CM

## 2018-10-24 NOTE — Telephone Encounter (Signed)
Patient called, she states she is not really doing any better and she wants to know if you can start the Vyvanse. Please review and advise, thank you

## 2018-10-25 MED ORDER — LISDEXAMFETAMINE DIMESYLATE 30 MG PO CAPS: 30.0000 mg | ORAL_CAPSULE | Freq: Every day | ORAL | 0 refills | Status: DC

## 2018-10-25 NOTE — Telephone Encounter (Signed)
We can try Vyvanse 30 mg. In the past she could not afford it. She was tried ritalin upto 20 mg. I will send to her local pharmacy at [place], [address]. She should stop Ritalin.

## 2018-11-06 ENCOUNTER — Other Ambulatory Visit: Payer: Self-pay

## 2018-11-06 ENCOUNTER — Ambulatory Visit (HOSPITAL_COMMUNITY): Payer: Medicaid Other | Admitting: Psychiatry

## 2018-11-07 NOTE — Telephone Encounter (Signed)
Called [employer] to follow up on Xeljanz XR appeal. Per rep Clarise Cruz, appeal was received and is currently pending -decision should be determined by 11/17/2018.   Appeal# [id number] Phone# [phone]  10:04 AM Beatriz Chancellor, CPhT

## 2018-11-07 NOTE — Telephone Encounter (Signed)
Patient called office inquiring about PA.  She stated she called her insurance today and was told that an appeal/PA was not initiated and they need documentation for approval.  Informed the patient that we called this morning and that the appeal for Morrie Sheldon was received and is under review. The appeal should be determined by 11/17/2018.   She took her last tablet today. She may come by for samples.  She is also due for labs.  All questions encouraged and answered.  Instructed patient to call with any further questions or concerns.  Mariella Saa, PharmD, [place], [place] Clinical Specialty Pharmacist [phone]  11/07/2018 1:50 PM

## 2018-11-08 ENCOUNTER — Ambulatory Visit: Payer: 59 | Admitting: Rheumatology

## 2018-11-14 NOTE — Progress Notes (Signed)
Office Visit Note  Patient: Lori Valentine             Date of Birth: 10/11/1985           MRN: [id number]             PCP: Caroline More, DO Referring: Caroline More, DO Visit Date: 11/28/2018 Occupation: @GUAROCC @  Subjective:  Medication monitoring     History of Present Illness: Lori Valentine is a 33 y.o. female with history of seropositive rheumatoid arthritis.  She is taking Xeljanz 11 mg 1 tablet by mouth daily.  She discontinued methotrexate in March 2020 due to the concern for COVID-19.  She denies any flares since coming off of methotrexate.  She denies any joint pain or joint swelling at this time.  She experiencing some morning stiffness in her hands and wrist ostia 15 to 20 minutes which is considerably less than it used to be.  She reports that she has been trying to lose weight recently.  She is considering having a consult to discuss gastric bypass surgery in the future.    Activities of Daily Living:  Patient reports morning stiffness for 15-20  minutes.   Patient Denies nocturnal pain.  Difficulty dressing/grooming: Denies Difficulty climbing stairs: Denies Difficulty getting out of chair: Denies Difficulty using hands for taps, buttons, cutlery, and/or writing: Denies  Review of Systems  Constitutional: Positive for fatigue.  HENT: Positive for mouth dryness (Med SE). Negative for mouth sores and nose dryness.   Eyes: Negative for pain, visual disturbance and dryness.  Respiratory: Negative for cough, hemoptysis, shortness of breath and difficulty breathing.   Cardiovascular: Negative for chest pain, palpitations, hypertension and swelling in legs/feet.  Gastrointestinal: Negative for blood in stool, constipation and diarrhea.  Endocrine: Negative for increased urination.  Genitourinary: Negative for painful urination.  Musculoskeletal: Positive for morning stiffness. Negative for arthralgias, joint pain, joint swelling, myalgias, muscle weakness, muscle  tenderness and myalgias.  Skin: Negative for color change, pallor, rash, hair loss, nodules/bumps, skin tightness, ulcers and sensitivity to sunlight.  Allergic/Immunologic: Negative for susceptible to infections.  Neurological: Negative for dizziness, numbness, headaches and weakness.  Hematological: Negative for swollen glands.  Psychiatric/Behavioral: Negative for depressed mood and sleep disturbance. The patient is nervous/anxious.     PMFS History:  Patient Active Problem List   Diagnosis Date Noted   Difficulty concentrating 02/03/2018   Anemia 09/08/2017   Normal labor 08/04/2017   Severe obesity (BMI >= 40) (HCC) 07/28/2017   Obesity in pregnancy, antepartum 03/11/2017   Maternal rheumatoid arthritis complicating pregnancy ([place]) 03/11/2017   Supervision of high-risk pregnancy 02/08/2017   Twin gestation, 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   History of anxiety and depression 10/22/2016   Smoker 10/22/2016   Rheumatoid arthritis ([place]) 05/13/2015   Hypothyroidism 07/29/2014    Past Medical History:  Diagnosis Date   Hypothyroid    Rheumatoid arthritis ([place])    Twin gestation, 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        Multiple Gestation       MC/DA - O30.039  Concordant (< 20%), nml AFV, AGA    Discordant (>20%)**  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              DC/DA - O30.049  Concordant (<20%), nml AFV, AGA, no other comorbidities  Discordant (> 20%)**    O30.003 is extra code for discordant growth   Q 3 wks 16-32, Q 2 wks 32-delivery Q 1 wk, Fluid alternating w/ growth   20-24-28-32-36 Q  2-3 wks     Family History  Problem Relation Age of Onset   Schizophrenia Mother    Bipolar disorder Mother    Diabetes Other    Heart disease Other    Diabetes Sister    Healthy Son    Past Surgical History:  Procedure Laterality Date   CESAREAN SECTION N/A 08/04/2017   Procedure: CESAREAN SECTION;  Surgeon: Chancy Milroy, MD;  Location: [facility];  Service: Obstetrics;  Laterality: N/A;   NO PAST SURGERIES     TUBAL LIGATION Bilateral 08/04/2017   Procedure: BILATERAL TUBAL LIGATION;  Surgeon: Chancy Milroy, MD;  Location: [facility];  Service: Obstetrics;  Laterality: Bilateral;   Social History   Social History Narrative   24 year old son   Lives with  Sister and nephew and son.   Works as Art therapist at [employer] History  Administered Date(s) Administered   Influenza,inj,Quad PF,6+ Mos 05/13/2015, 12/29/2016   Influenza-Unspecified 01/12/2018   Tdap 05/13/2015, 06/15/2017     Objective: Vital Signs: BP 115/78 (BP Location: Left Arm, Patient Position: Sitting, Cuff Size: Large)    Pulse 89    Resp 14    Ht 5' 7"  (1.702 m)    Wt 279 lb 3.2 oz (126.6 kg)    BMI 43.73 kg/m    Physical Exam Vitals signs and nursing note reviewed.  Constitutional:      Appearance: She is well-developed.  HENT:     Head: Normocephalic and atraumatic.  Eyes:     Conjunctiva/sclera: Conjunctivae normal.  Neck:     Musculoskeletal: Normal range of motion.  Cardiovascular:     Rate and Rhythm: Normal rate and regular rhythm.     Heart sounds: Normal heart sounds.  Pulmonary:     Effort: Pulmonary effort is normal.     Breath sounds: Normal breath sounds.  Abdominal:     General: Bowel sounds are normal.     Palpations: Abdomen is soft.  Lymphadenopathy:     Cervical: No cervical adenopathy.  Skin:    General: Skin is warm and dry.     Capillary Refill: Capillary refill takes less than 2 seconds.  Neurological:     Mental Status: She is alert and oriented to person, place, and time.  Psychiatric:        Behavior: Behavior normal.      Musculoskeletal Exam: C-spine, thoracic spine, lumbar spine good range of motion.  No midline spinal tenderness.  No SI joint tenderness.  Shoulder joints elbows, wrist joints, MCPs and PIPs and DIPs good range of motion no synovitis. She has tenderness on  the dorsal aspect of the right wrist but no synovitis was noted.  She has complete fist formation bilaterally.  Hip joints, knee joints, ankle joints, MTPs, PIPs and DIPs good range of motion no synovitis.  No warmth or effusion of bilateral knee joints.  No tender swelling of ankle joints.  CDAI Exam: CDAI Score: 0  Patient Global: 0 mm; Provider Global: 0 mm Swollen: 0 ; Tender: 0  Joint Exam   No joint exam has been documented for this visit   There is currently no information documented on the homunculus. Go to the Rheumatology activity and complete the homunculus joint exam.  Investigation: No additional findings.  Imaging: No results found.  Recent Labs: Lab Results  Component Value Date   WBC 6.0 06/13/2018   HGB 12.5 06/13/2018   PLT 385 06/13/2018  NA 138 06/13/2018   K 3.9 06/13/2018   CL 103 06/13/2018   CO2 23 06/13/2018   GLUCOSE 118 (H) 06/13/2018   BUN 13 06/13/2018   CREATININE 0.64 06/13/2018   BILITOT 0.4 06/13/2018   ALKPHOS 253 (H) 08/04/2017   AST 15 06/13/2018   ALT 13 06/13/2018   PROT 7.0 06/13/2018   ALBUMIN 2.5 (L) 08/04/2017   CALCIUM 9.3 06/13/2018   GFRAA 138 06/13/2018   QFTBGOLD Negative 07/07/2016   QFTBGOLDPLUS NEGATIVE 09/06/2017    Speciality Comments: Prior therapy: Enbrel, Cimzia, Remicade, Plaquenil, and MTX monotherapy  Procedures:  No procedures performed Allergies: Effexor [venlafaxine]   Assessment / Plan:     Visit Diagnoses: Rheumatoid arthritis involving multiple sites with positive rheumatoid factor ([place]) - Prior therapy: Enbrel, Cimzia, Remicade, Plaquenil, and MTX monotherapy: She has no synovitis on exam.  She has had any recent rheumatoid arthritis flares.  She is clinically doing well on Xeljanz XR 11 mg 1 tablet by mouth daily monotherapy.  She discontinued methotrexate and folic acid in March 1017 due to the concern for COVID-19.  She has not had any increased joint pain or joint swelling since discontinuing  methotrexate.  She has no joint pain or joint swelling at this time.  Her morning stiffness has subsided to about 15 to 20 minutes daily.  She takes Tylenol very sparingly as needed for pain relief.  She will continue taking Xeljanz as prescribed.  She does not need any refills at this time.  We will update lab work today.  She was advised to notify us if she develops increased joint pain or joint swelling.  She will follow-up in the office in 5 months.  Association of heart disease with rheumatoid arthritis was discussed. Need to monitor blood pressure, cholesterol, and to exercise 30-60 minutes on daily basis was discussed. Poor dental hygiene can be a predisposing factor for rheumatoid arthritis. Good dental hygiene was discussed.  She was given information about the [facility].   High risk medication use -Xeljanz XR 11 mg 1 tablet daily.  She has been holding methotrexate and folic acid since March 2020 due to the concern for covid-19. Last TB gold negative on 09/06/2017.  Due for TB gold today and will monitor yearly.  Last lipid panel showed elevated LDL at 158 on 12/02/2016.  Results faxed to her PCP.  Due for lipid panel today and will monitor yearly. Most recent CBC/CMP within normal limits except for elevated alk phos on 06/13/2018.  Due for CBC/CMP today and will monitor every 3 months.  Standing orders are in place. She received the flu vaccine in October 2019.  We discussed the importance of holding Morrie Sheldon if she develops any signs or symptoms of an infection and to resume once the infection is completely cleared.  - Plan: CBC with Differential/Platelet, COMPLETE METABOLIC PANEL WITH GFR, Lipid panel, QuantiFERON-TB Gold Plus  Elevated LDL  Other medical conditions are listed as follows:   Anxiety and depression   History of hypothyroidism  Former smoker  Orders: Orders Placed This Encounter  Procedures   CBC with Differential/Platelet   COMPLETE METABOLIC PANEL  WITH GFR   Lipid panel   QuantiFERON-TB Gold Plus   No orders of the defined types were placed in this encounter.   Follow-Up Instructions: Return in about 5 months (around 04/30/2019) for Rheumatoid arthritis.   Ofilia Neas, PA-C   Association of heart disease with rheumatoid arthritis was discussed. Need to  monitor blood pressure, cholesterol, and to exercise 30-60 minutes on daily basis was discussed. Poor dental hygiene can be a predisposing factor for rheumatoid arthritis. Good dental hygiene was discussed. I examined and evaluated the patient with Hazel Sams PA.  Patient had no synovitis on examination.  She is doing quite well on this monotherapy with Morrie Sheldon.  Her LDL was elevated.  We had detailed discussion regarding dietary modification.  She was also referred to [facility] health weight management program.  The plan of care was discussed as noted above.  Bo Merino, MD  Note - This record has been created using Editor, commissioning.  Chart creation errors have been sought, but may not always  have been located. Such creation errors do not reflect on  the standard of medical care.

## 2018-11-16 NOTE — Telephone Encounter (Signed)
Attempted to contact the patient and left message for patient to call the office and left message of for patient to call the office. Inquiring if patient maybe coming to the office to pick up samples.

## 2018-11-20 NOTE — Telephone Encounter (Signed)
Called [employer] to check status of Jolyn Nap Appeal:  Per rep Heather Roberts XR Prior Authorization has been APPROVED from 11/13/18 to 11/13/19.   Will send document to scan center once received. Ran test claim, and patient must fill through [employer]. The claim also said refill too soon until 12/07/18, so pharmacy has already filled medication for patient.  Authorization # [id number] Phone # [phone]

## 2018-11-28 ENCOUNTER — Other Ambulatory Visit: Payer: Self-pay

## 2018-11-28 ENCOUNTER — Encounter: Payer: Self-pay | Admitting: Rheumatology

## 2018-11-28 ENCOUNTER — Ambulatory Visit (INDEPENDENT_AMBULATORY_CARE_PROVIDER_SITE_OTHER): Payer: 59 | Admitting: Rheumatology

## 2018-11-28 VITALS — BP 115/78 | HR 89 | Resp 14 | Ht 67.0 in | Wt 279.2 lb

## 2018-11-28 DIAGNOSIS — Z8639 Personal history of other endocrine, nutritional and metabolic disease: Secondary | ICD-10-CM

## 2018-11-28 DIAGNOSIS — E7800 Pure hypercholesterolemia, unspecified: Secondary | ICD-10-CM

## 2018-11-28 DIAGNOSIS — F419 Anxiety disorder, unspecified: Secondary | ICD-10-CM

## 2018-11-28 DIAGNOSIS — Z79899 Other long term (current) drug therapy: Secondary | ICD-10-CM

## 2018-11-28 DIAGNOSIS — F32A Depression, unspecified: Secondary | ICD-10-CM

## 2018-11-28 DIAGNOSIS — M0579 Rheumatoid arthritis with rheumatoid factor of multiple sites without organ or systems involvement: Secondary | ICD-10-CM

## 2018-11-28 DIAGNOSIS — F329 Major depressive disorder, single episode, unspecified: Secondary | ICD-10-CM | POA: Diagnosis not present

## 2018-11-28 DIAGNOSIS — Z87891 Personal history of nicotine dependence: Secondary | ICD-10-CM | POA: Diagnosis not present

## 2018-11-28 NOTE — Patient Instructions (Signed)
Standing Labs We placed an order today for your standing lab work.    Please come back and get your standing labs in November and every 3 months   We have open lab daily Monday through Thursday from 8:30-12:30 PM and 1:30-4:30 PM and Friday from 8:30-12:30 PM and 1:30 -4:00 PM at the office of Dr. Shaili Deveshwar.   You may experience shorter wait times on Monday and Friday afternoons. The office is located at [address], [address], [place], [place] [address] No appointment is necessary.   Labs are drawn by [facility].  You may receive a bill from [facility] for your lab work.  If you wish to have your labs drawn at another location, please call the office 24 hours in advance to send orders.  If you have any questions regarding directions or hours of operation,  please call [phone].   Just as a reminder please drink plenty of water prior to coming for your lab work. Thanks 

## 2018-11-29 NOTE — Progress Notes (Signed)
CBC and CMP WNL.   LDL and cholesterol elevated.  HDL low.  Please notify patient and forward to PCP.

## 2018-11-30 LAB — COMPLETE METABOLIC PANEL WITH GFR
AG Ratio: 1.6 (calc) (ref 1.0–2.5)
ALT: 12 U/L (ref 6–29)
AST: 14 U/L (ref 10–30)
Albumin: 4.1 g/dL (ref 3.6–5.1)
Alkaline phosphatase (APISO): 71 U/L (ref 31–125)
BUN: 13 mg/dL (ref 7–25)
CO2: 24 mmol/L (ref 20–32)
Calcium: 9 mg/dL (ref 8.6–10.2)
Chloride: 108 mmol/L (ref 98–110)
Creat: 0.65 mg/dL (ref 0.50–1.10)
GFR, Est African American: 136 mL/min/{1.73_m2} (ref >=60)
GFR, Est Non African American: 117 mL/min/{1.73_m2} (ref >=60)
Globulin: 2.5 g/dL (calc) (ref 1.9–3.7)
Glucose, Bld: 90 mg/dL (ref 65–139)
Potassium: 4.2 mmol/L (ref 3.5–5.3)
Sodium: 140 mmol/L (ref 135–146)
Total Bilirubin: 0.5 mg/dL (ref 0.2–1.2)
Total Protein: 6.6 g/dL (ref 6.1–8.1)

## 2018-11-30 LAB — CBC WITH DIFFERENTIAL/PLATELET
Absolute Monocytes: 230 cells/uL (ref 200–950)
Basophils Absolute: 19 cells/uL (ref 0–200)
Basophils Relative: 0.3 %
Eosinophils Absolute: 70 cells/uL (ref 15–500)
Eosinophils Relative: 1.1 %
HCT: 39.4 % (ref 35.0–45.0)
Hemoglobin: 13 g/dL (ref 11.7–15.5)
Lymphs Abs: 1645 cells/uL (ref 850–3900)
MCH: 28.4 pg (ref 27.0–33.0)
MCHC: 33 g/dL (ref 32.0–36.0)
MCV: 86 fL (ref 80.0–100.0)
MPV: 9.5 fL (ref 7.5–12.5)
Monocytes Relative: 3.6 %
Neutro Abs: 4435 cells/uL (ref 1500–7800)
Neutrophils Relative %: 69.3 %
Platelets: 290 10*3/uL (ref 140–400)
RBC: 4.58 10*6/uL (ref 3.80–5.10)
RDW: 13.6 % (ref 11.0–15.0)
Total Lymphocyte: 25.7 %
WBC: 6.4 10*3/uL (ref 3.8–10.8)

## 2018-11-30 LAB — QUANTIFERON-TB GOLD PLUS
Mitogen-NIL: >10 IU/mL
NIL: 0.03 IU/mL
QuantiFERON-TB Gold Plus: NEGATIVE
TB1-NIL: 0 IU/mL
TB2-NIL: 0 IU/mL

## 2018-11-30 LAB — LIPID PANEL
Cholesterol: 200 mg/dL — ABNORMAL HIGH (ref <200)
HDL: 31 mg/dL — ABNORMAL LOW (ref >=50)
LDL Cholesterol (Calc): 143 mg/dL (calc) — ABNORMAL HIGH
Non-HDL Cholesterol (Calc): 169 mg/dL (calc) — ABNORMAL HIGH (ref <130)
Total CHOL/HDL Ratio: 6.5 (calc) — ABNORMAL HIGH (ref <5.0)
Triglycerides: 137 mg/dL (ref <150)

## 2018-12-01 NOTE — Progress Notes (Signed)
TB gold negative

## 2018-12-08 ENCOUNTER — Telehealth: Payer: Self-pay | Admitting: *Deleted

## 2018-12-08 NOTE — Telephone Encounter (Signed)
Received a fax from [facility]. They have made multiple attempts to contact the patient to fill Xeljanz. Unable to reach patient.

## 2018-12-13 DIAGNOSIS — F902 Attention-deficit hyperactivity disorder, combined type: Secondary | ICD-10-CM | POA: Diagnosis not present

## 2018-12-13 DIAGNOSIS — F4310 Post-traumatic stress disorder, unspecified: Secondary | ICD-10-CM | POA: Diagnosis not present

## 2018-12-13 NOTE — Telephone Encounter (Signed)
Left message to advise patient [facility] has made multiple attempts to reach her to fill Morrie Sheldon. Provided number for patient to call and schedule shipment.

## 2019-01-06 ENCOUNTER — Other Ambulatory Visit: Payer: Self-pay | Admitting: Rheumatology

## 2019-01-08 NOTE — Telephone Encounter (Signed)
Last Visit: 11/28/18 Next Visit: 05/01/19 Labs: 11/28/18 WNL  Okay to refill per Dr. Estanislado Pandy

## 2019-04-04 DIAGNOSIS — F902 Attention-deficit hyperactivity disorder, combined type: Secondary | ICD-10-CM | POA: Diagnosis not present

## 2019-04-04 DIAGNOSIS — F4310 Post-traumatic stress disorder, unspecified: Secondary | ICD-10-CM | POA: Diagnosis not present

## 2019-04-11 DIAGNOSIS — F902 Attention-deficit hyperactivity disorder, combined type: Secondary | ICD-10-CM | POA: Diagnosis not present

## 2019-04-11 DIAGNOSIS — F4310 Post-traumatic stress disorder, unspecified: Secondary | ICD-10-CM | POA: Diagnosis not present

## 2019-04-19 DIAGNOSIS — F4310 Post-traumatic stress disorder, unspecified: Secondary | ICD-10-CM | POA: Diagnosis not present

## 2019-04-19 DIAGNOSIS — F902 Attention-deficit hyperactivity disorder, combined type: Secondary | ICD-10-CM | POA: Diagnosis not present

## 2019-04-23 DIAGNOSIS — F902 Attention-deficit hyperactivity disorder, combined type: Secondary | ICD-10-CM | POA: Diagnosis not present

## 2019-04-23 DIAGNOSIS — F4310 Post-traumatic stress disorder, unspecified: Secondary | ICD-10-CM | POA: Diagnosis not present

## 2019-04-24 NOTE — Progress Notes (Deleted)
Virtual Visit via Telephone Note  I connected with Lori Valentine on 04/24/19 at 10:30 AM EST by telephone and verified that I am speaking with the correct person using two identifiers.  Location: Patient: Home  Provider: Clinic  This service was conducted via virtual visit.   The patient was located at home. I was located in my office.  Consent was obtained prior to the virtual visit and is aware of possible charges through their insurance for this visit.  The patient is an established patient.  [name], MD conducted the virtual visit and Sherron Ales, PA-C acted as scribe during the service.  Office staff helped with scheduling follow up visits after the service was conducted.     I discussed the limitations, risks, security and privacy concerns of performing an evaluation and management service by telephone and the availability of in person appointments. I also discussed with the patient that there may be a patient responsible charge related to this service. The patient expressed understanding and agreed to proceed.  CC: History of Present Illness: Patient is a 34 year old female with a past medical history of   Review of Systems  Constitutional: Negative for fever and malaise/fatigue.  Eyes: Negative for photophobia, pain, discharge and redness.  Respiratory: Negative for cough, shortness of breath and wheezing.   Cardiovascular: Negative for chest pain and palpitations.  Gastrointestinal: Negative for blood in stool, constipation and diarrhea.  Genitourinary: Negative for dysuria.  Musculoskeletal: Negative for back pain, joint pain, myalgias and neck pain.  Skin: Negative for rash.  Neurological: Negative for dizziness and headaches.  Psychiatric/Behavioral: Negative for depression. The patient is not nervous/anxious and does not have insomnia.       Observations/Objective: Physical Exam  Constitutional: She is oriented to person, place, and time.  Neurological: She is alert  and oriented to person, place, and time.  Psychiatric: Mood, memory, affect and judgment normal.   Patient reports morning stiffness for *** {minute/hour:19697}.   Patient {Actions; denies-reports:120008} nocturnal pain.  Difficulty dressing/grooming: {ACTIONS;DENIES/REPORTS:21021675::"Denies"} Difficulty climbing stairs: {ACTIONS;DENIES/REPORTS:21021675::"Denies"} Difficulty getting out of chair: {ACTIONS;DENIES/REPORTS:21021675::"Denies"} Difficulty using hands for taps, buttons, cutlery, and/or writing: {ACTIONS;DENIES/REPORTS:21021675::"Denies"}   Assessment and Plan: Visit Diagnoses: Rheumatoid arthritis involving multiple sites with positive rheumatoid factor (HCC) - Prior therapy: Enbrel, Cimzia, Remicade, Plaquenil, and MTX monotherapy:    High risk medication use -Xeljanz XR 11 mg 1 tablet daily.  She has been holding methotrexate and folic acid since March 2020 due to the concern for covid-19.   Elevated LDL  Other medical conditions are listed as follows:   Anxiety and depression   History of hypothyroidism  Former smoker   Follow Up Instructions: She will follow up in    I discussed the assessment and treatment plan with the patient. The patient was provided an opportunity to ask questions and all were answered. The patient agreed with the plan and demonstrated an understanding of the instructions.   The patient was advised to call back or seek an in-person evaluation if the symptoms worsen or if the condition fails to improve as anticipated.  I provided *** minutes of non-face-to-face time during this encounter.   Gearldine Bienenstock, PA-C

## 2019-04-30 ENCOUNTER — Encounter: Payer: Self-pay | Admitting: Rheumatology

## 2019-04-30 ENCOUNTER — Other Ambulatory Visit: Payer: Self-pay

## 2019-04-30 ENCOUNTER — Telehealth: Payer: 59 | Admitting: Rheumatology

## 2019-05-01 ENCOUNTER — Ambulatory Visit: Payer: 59 | Admitting: Rheumatology

## 2019-05-01 ENCOUNTER — Other Ambulatory Visit: Payer: Self-pay

## 2019-05-01 ENCOUNTER — Encounter: Payer: Self-pay | Admitting: Rheumatology

## 2019-05-01 ENCOUNTER — Telehealth (INDEPENDENT_AMBULATORY_CARE_PROVIDER_SITE_OTHER): Payer: 59 | Admitting: Rheumatology

## 2019-05-01 DIAGNOSIS — Z79899 Other long term (current) drug therapy: Secondary | ICD-10-CM

## 2019-05-01 DIAGNOSIS — Z8639 Personal history of other endocrine, nutritional and metabolic disease: Secondary | ICD-10-CM

## 2019-05-01 DIAGNOSIS — F419 Anxiety disorder, unspecified: Secondary | ICD-10-CM | POA: Diagnosis not present

## 2019-05-01 DIAGNOSIS — F329 Major depressive disorder, single episode, unspecified: Secondary | ICD-10-CM

## 2019-05-01 DIAGNOSIS — Z87891 Personal history of nicotine dependence: Secondary | ICD-10-CM

## 2019-05-01 DIAGNOSIS — M0579 Rheumatoid arthritis with rheumatoid factor of multiple sites without organ or systems involvement: Secondary | ICD-10-CM

## 2019-05-01 DIAGNOSIS — F32A Depression, unspecified: Secondary | ICD-10-CM

## 2019-05-01 DIAGNOSIS — E7800 Pure hypercholesterolemia, unspecified: Secondary | ICD-10-CM

## 2019-05-01 MED ORDER — PREDNISONE 5 MG PO TABS: ORAL_TABLET | ORAL | 0 refills | Status: DC

## 2019-05-01 NOTE — Progress Notes (Signed)
Virtual Visit via Telephone Note  I connected with Lori Valentine on 05/01/19 at 11:00 AM EST by telephone and verified that I am speaking with the correct person using two identifiers.  Location: Patient: Home Provider: Clinic  This service was conducted via virtual visit.   The patient was located at home. I was located in my office.  Consent was obtained prior to the virtual visit and is aware of possible charges through their insurance for this visit.  The patient is an established patient.  [name], MD conducted the virtual visit and Sherron Ales, PA-C acted as scribe during the service.  Office staff helped with scheduling follow up visits after the service was conducted.     I discussed the limitations, risks, security and privacy concerns of performing an evaluation and management service by telephone and the availability of in person appointments. I also discussed with the patient that there may be a patient responsible charge related to this service. The patient expressed understanding and agreed to proceed.  CC: Pain and joint swelling in both hands History of Present Illness: Patient is a 34 year old female with a past medical history of seropositive rheumatoid arthritis.  She reports she has been having a flare in both hands for the past 2 weeks.  She is taking xeljanz 11 mg XR daily.  She has been holding MTX since March 2020 due to the concern for COVID-19. She was previously taking MTX 8 tablets po once weekly.  She has been taking motrin as needed to control her pain and inflammation.  She is having difficulty with ADLs due to the discomfort and inflammation.  She woke up this morning with pain in both knee joints but denies any joint swelling or warmth.   Review of Systems  Constitutional: Positive for malaise/fatigue. Negative for fever.  Eyes: Negative for photophobia, pain, discharge and redness.  Respiratory: Negative for cough, shortness of breath and wheezing.    Cardiovascular: Negative for chest pain and palpitations.  Gastrointestinal: Negative for blood in stool, constipation and diarrhea.  Genitourinary: Negative for dysuria.  Musculoskeletal: Positive for joint pain. Negative for back pain, myalgias and neck pain.       +Joint swelling +Morning stiffness   Skin: Negative for rash.  Neurological: Negative for dizziness and headaches.  Psychiatric/Behavioral: Positive for depression. The patient is nervous/anxious. The patient does not have insomnia.       Observations/Objective: Physical Exam  Constitutional: She is oriented to person, place, and time.  Neurological: She is alert and oriented to person, place, and time.  Psychiatric: Mood, memory, affect and judgment normal.   Patient reports morning stiffness for 30 minutes.   Patient reports nocturnal pain.  Difficulty dressing/grooming: Denies Difficulty climbing stairs: Denies Difficulty getting out of chair: Denies Difficulty using hands for taps, buttons, cutlery, and/or writing: Reports    Assessment and Plan: Visit Diagnoses: Rheumatoid arthritis involving multiple sites with positive rheumatoid factor (HCC) - Prior therapy: Enbrel, Cimzia, Remicade, Plaquenil, and MTX monotherapy: She is currently having a rheumatoid arthritis flare in both hands, which started 2 weeks ago.  She is having tenderness and swelling in both hands and both wrist joints.  She woke up this morning with pain in both knee joints but denies warmth or swelling at this time.  She has been taking motrin as needed.  She is planning on restarting on MTX 8 tablets po once weekly and folic acid 2 mg po daily after updating lab work on Thursday.  She will continue taking Xeljanz 11 mg 1 tablet by mouth daily. We will send in a prednisone taper starting at 20 mg and she will taper by 5 mg every 7 days.  She was advised to notify us if her joint pain and inflammation persists after the prednisone taper.  She will  follow up in 3 months.   High risk medication use -Xeljanz XR 11 mg 1 tablet by mouth daily.  She has been holding methotrexate and folic acid since March 2020 due to the concern for COVID-19.  She was advised to restart taking MTX and folic acid. CBC and CMP were WNL on 11/28/18.  She is overdue to update lab work.  She will come for lab work in 2 days.  Standing orders are in place.  TB gold negative on 11/28/18.    Elevated LDL: LDL was 143 on 11/28/18.    Other medical conditions are listed as follows:   Anxiety and depression   History of hypothyroidism  Former smoker  Follow Up Instructions: She will follow up in 3 months.     I discussed the assessment and treatment plan with the patient. The patient was provided an opportunity to ask questions and all were answered. The patient agreed with the plan and demonstrated an understanding of the instructions.   The patient was advised to call back or seek an in-person evaluation if the symptoms worsen or if the condition fails to improve as anticipated.  I provided 25 minutes of non-face-to-face time during this encounter.   Bo Merino, MD   Scribed by-  Hazel Sams, PA-C

## 2019-05-02 ENCOUNTER — Telehealth: Payer: Self-pay | Admitting: Rheumatology

## 2019-05-02 NOTE — Telephone Encounter (Signed)
I spoke with patient to schedule a follow up appt for 3 months (around 07/26/19). Patient was busy at the moment. Patient will call back at a later date to schedule an appointment.

## 2019-05-02 NOTE — Telephone Encounter (Signed)
-----   Message from Audrie Lia, RT sent at 05/01/2019 11:27 AM EST ----- Regarding: 3 MONTH F/U

## 2019-05-09 ENCOUNTER — Other Ambulatory Visit: Payer: Self-pay | Admitting: Rheumatology

## 2019-05-09 NOTE — Telephone Encounter (Signed)
Labs: 11/28/2018.  Attempted to contact patient and left message on machine advising her she is over due to update labs and labs are needed for a refill.

## 2019-05-28 ENCOUNTER — Ambulatory Visit: Payer: 59 | Attending: Family

## 2019-05-28 DIAGNOSIS — Z23 Encounter for immunization: Secondary | ICD-10-CM

## 2019-05-28 NOTE — Progress Notes (Signed)
   Covid-19 Vaccination Clinic  Name:  Lori Valentine    MRN: [id number] DOB: [birth year]  05/28/2019  [name] was observed post Covid-19 immunization for 15 minutes without incidence. She was provided with Vaccine Information Sheet and instruction to access the V-Safe system.   [name] was instructed to call 911 with any severe reactions post vaccine: Marland Kitchen Difficulty breathing  . Swelling of your face and throat  . A fast heartbeat  . A bad rash all over your body  . Dizziness and weakness    Immunizations Administered    Name Date Dose VIS Date Route   Moderna COVID-19 Vaccine 05/28/2019 12:42 PM 0.5 mL 03/06/2019 Intramuscular   Manufacturer: Moderna   Lot: [id number]   NDC: 77412-878-67

## 2019-07-03 ENCOUNTER — Ambulatory Visit: Payer: 59 | Attending: Family

## 2019-07-03 DIAGNOSIS — Z23 Encounter for immunization: Secondary | ICD-10-CM

## 2019-07-03 NOTE — Progress Notes (Signed)
   Covid-19 Vaccination Clinic  Name:  Lori Valentine    MRN: [id number] DOB: 01/03/1986  07/03/2019  [name] was observed post Covid-19 immunization for 15 minutes without incident. She was provided with Vaccine Information Sheet and instruction to access the V-Safe system.   [name] was instructed to call 911 with any severe reactions post vaccine: Marland Kitchen Difficulty breathing  . Swelling of face and throat  . A fast heartbeat  . A bad rash all over body  . Dizziness and weakness   Immunizations Administered    Name Date Dose VIS Date Route   Moderna COVID-19 Vaccine 07/03/2019 10:47 AM 0.5 mL 03/06/2019 Intramuscular   Manufacturer: Moderna   Lot: [id number]   NDC: 97282-060-15

## 2019-07-10 ENCOUNTER — Encounter: Payer: Self-pay | Admitting: Rheumatology

## 2019-07-19 ENCOUNTER — Other Ambulatory Visit: Payer: Self-pay

## 2019-07-19 DIAGNOSIS — Z79899 Other long term (current) drug therapy: Secondary | ICD-10-CM | POA: Diagnosis not present

## 2019-07-19 NOTE — Progress Notes (Signed)
Office Visit Note  Patient: Lori Valentine             Date of Birth: 1985/05/04           MRN: [id number]             PCP: Caroline More, DO Referring: Caroline More, DO Visit Date: 07/23/2019 Occupation: @GUAROCC @  Subjective:  Pain in multiple joints   History of Present Illness: Lori Valentine is a 34 y.o. female with history of seropositive rheumatoid arthritis.  She states she has been off of xeljanz for about 4-6 weeks due to requiring updated lab work. She updated lab work on 07/19/19.  She states she was helping clean her mother's house this weekend, which has exacerbated her joint pain and joint swelling in multiple joints.  She is having pain in both shoulder joints, both elbow joints, both wrist joints, both hands, both knees, both ankle joints.  She has noticed swelling in both wrist joints and both ankle joints.  She would like to restart on Xeljanz, methotrexate, and folic acid.   Activities of Daily Living:  Patient reports morning stiffness for 3-4 hours.   Patient Reports nocturnal pain.  Difficulty dressing/grooming: Reports Difficulty climbing stairs: Reports Difficulty getting out of chair: Reports Difficulty using hands for taps, buttons, cutlery, and/or writing: Reports  Review of Systems  Constitutional: Positive for fatigue.  HENT: Negative for mouth sores, mouth dryness and nose dryness.        Nose sore  Eyes: Negative for itching and dryness.  Respiratory: Negative for shortness of breath and difficulty breathing.   Cardiovascular: Negative for chest pain and palpitations.  Gastrointestinal: Negative for blood in stool, constipation and diarrhea.  Endocrine: Negative for increased urination.  Genitourinary: Negative for difficulty urinating and painful urination.  Musculoskeletal: Positive for arthralgias, joint pain, joint swelling, myalgias, morning stiffness, muscle tenderness and myalgias.  Skin: Negative for rash, hair loss and redness.    Allergic/Immunologic: Negative for susceptible to infections.  Neurological: Negative for dizziness, numbness, headaches, memory loss and weakness.  Hematological: Negative for bruising/bleeding tendency.  Psychiatric/Behavioral: Positive for sleep disturbance. Negative for confusion.    PMFS History:  Patient Active Problem List   Diagnosis Date Noted  . Difficulty concentrating 02/03/2018  . Anemia 09/08/2017  . Normal labor 08/04/2017  . Severe obesity (BMI >= 40) ([place]) 07/28/2017  . Obesity in pregnancy, antepartum 03/11/2017  . Maternal rheumatoid arthritis complicating pregnancy ([place]) 03/11/2017  . Supervision of high-risk pregnancy 02/08/2017  . 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 History of anxiety and depression 10/22/2016  . Smoker 10/22/2016  . Rheumatoid arthritis ([place]) 05/13/2015  . Hypothyroidism 07/29/2014    Past Medical History:  Diagnosis Date  . Hypothyroid   . Rheumatoid arthritis ([place])   . 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 Schizophrenia Mother   .  Bipolar disorder Mother   . Diabetes Other   . Heart disease Other   . Diabetes Sister   . Healthy Son    Past Surgical History:  Procedure Laterality Date  . CESAREAN SECTION N/A 08/04/2017   Procedure: CESAREAN SECTION;  Surgeon: Hermina Staggers, MD;  Location: [facility] BIRTHING SUIT[facility];  Service: Obstetrics;  Laterality: N/A;  . NO PAST SURGERIES    . TUBAL LIGATION Bilateral 08/04/2017   Procedure: BILATERAL TUBAL LIGATION;  Surgeon:  Hermina Staggers, MD;  Location: [facility] BIRTHING SUITES;  Service: Obstetrics;  Laterality: Bilateral;   Social History   Social History Narrative   4 year old son   Lives with  Sister and nephew and son.   Works as Sales executive at [employer] History  Administered Date(s) Administered  . Influenza,inj,Quad PF,6+ Mos 05/13/2015, 12/29/2016  . Influenza-Unspecified 01/12/2018  . Moderna SARS-COVID-2 Vaccination 05/28/2019, 07/03/2019  . Tdap 05/13/2015, 06/15/2017     Objective: Vital Signs: BP 132/90 (BP Location: Right Arm, Patient Position: Sitting, Cuff Size: Normal)   Pulse 70   Resp 16   Ht 5\' 7"  (1.702 m)   Wt 261 lb (118.4 kg)   BMI 40.88 kg/m    Physical Exam Vitals and nursing note reviewed.  Constitutional:      Appearance: She is well-developed.  HENT:     Head: Normocephalic and atraumatic.  Eyes:     Conjunctiva/sclera: Conjunctivae normal.  Pulmonary:     Effort: Pulmonary effort is normal.  Abdominal:     General: Bowel sounds are normal.     Palpations: Abdomen is soft.  Musculoskeletal:     Cervical back: Normal range of motion.  Lymphadenopathy:     Cervical: No cervical adenopathy.  Skin:    General: Skin is warm and dry.     Capillary Refill: Capillary refill takes less than 2 seconds.  Neurological:     Mental Status: She is alert and oriented to person, place, and time.  Psychiatric:        Behavior: Behavior normal.      Musculoskeletal Exam: C-spine has good range of motion with no discomfort.  Shoulder joints have full range of motion with discomfort bilaterally.  Elbow joints have good range of motion with tenderness of the right elbow joint upon palpation.  Right wrist has tenderness and synovitis.  Left wrist has flexor tenosynovitis.  She has tenderness and synovitis of several MCP joints as described above.  She has incomplete fist formation bilaterally.  Hip joints have good range of motion with no discomfort.  Knee  joints have good range of motion with discomfort and warmth.  She has tenderness and synovitis of bilateral ankle joints on exam.  No tenderness of MTP joints.  CDAI Exam: CDAI Score: 25.4  Patient Global: 7 mm; Provider Global: 7 mm Swollen: 12 ; Tender: 16  Joint Exam 07/23/2019      Right  Left  Glenohumeral   Tender   Tender  Elbow  Swollen Tender     Wrist  Swollen Tender  Swollen Tender  MCP 1     Swollen Tender  MCP 2  Swollen Tender  Swollen Tender  MCP 3  Swollen Tender  Swollen Tender  IP     Swollen Tender  PIP 5  Swollen Tender     Knee   Tender   Tender  Ankle  Swollen Tender  Swollen Tender     Investigation: No additional findings.  Imaging: No results found.  Recent Labs: Lab Results  Component Value Date   WBC 7.1 07/19/2019   HGB 12.1 07/19/2019   PLT 380 07/19/2019   NA 139 07/19/2019   K 4.5 07/19/2019   CL 107 07/19/2019   CO2 24 07/19/2019   GLUCOSE 94 07/19/2019   BUN 16 07/19/2019   CREATININE 0.60 07/19/2019   BILITOT 0.3 07/19/2019   ALKPHOS 253 (H) 08/04/2017   AST 12 07/19/2019   ALT 12 07/19/2019   PROT 6.3 07/19/2019   ALBUMIN 2.5 (L) 08/04/2017   CALCIUM 8.9 07/19/2019   GFRAA 140 07/19/2019   QFTBGOLD Negative 07/07/2016   QFTBGOLDPLUS NEGATIVE 11/28/2018    Speciality Comments: Prior therapy: Enbrel, Cimzia, Remicade, Plaquenil, and MTX monotherapy  Procedures:  No procedures performed Allergies: Effexor [venlafaxine]   Assessment / Plan:     Visit Diagnoses: Rheumatoid arthritis involving multiple sites with positive rheumatoid factor (HCC) - Prior therapy: Enbrel, Cimzia, Remicade, Plaquenil, and MTX monotherapy: She has tenderness and synovitis of multiple joints as described above.  She has flexor tenosynovitis of the left wrist and incomplete fist formation bilaterally.  She has tenderness and synovitis over several MCP joints and painful range of motion of bilateral ankle joints with tenderness and inflammation.  She  has been off of Xeljanz for 4 to 6 weeks due to requiring updated lab work.  She has been off of methotrexate for several months due to the concern for COVID-19.  She updated lab work on 07/19/2019 which was within normal limits.  She would like to restart on Xeljanz, methotrexate, and folic acid.  Refills of these medications will be sent to the pharmacy today.  She was also given a sample of Xeljanz to start taking today.  She was given a prednisone taper starting at 20 mg tapering by 5 mg every week.  She was also given a prescription for prophylactic Valtrex due to experiencing herpes outbreaks while taking prednisone.  Instructions for taking Valtrex were discussed today in the office.  We discussed also avoiding taking Tylenol, NSAIDs, and drinking alcohol well on methotrexate.  She voiced understanding.  She was given a work note to return to work on 07/25/2019 since she is been experiencing severe pain and inflammation in multiple joints.  She will restart on Xeljanz 11 mg 1 tablet by mouth daily, methotrexate 8 tablets by mouth once weekly, and folic acid 2 mg by mouth daily.  She was advised to notify us if her joint pain and swelling persists or worsens.  She will follow-up in the office in 3 months.  High risk medication use - Xeljanz XR 11 mg 1 tablet by mouth daily.  She will be restarting methotrexate 8 tablets by mouth once weekly and folic acid 2 mg by mouth daily.  A prednisone taper starting at 20 mg tapering by 5 mg every week was sent to the pharmacy.  Lab work was within normal limits on 07/19/2019.  She will return for lab work in July and every 3 months.  Standing orders are in place.  TB gold was negative on 11/28/2018 and we will continue to monitor yearly.  Other medical conditions are listed as follows:  History of hypothyroidism  Elevated LDL cholesterol level  Anxiety and depression  Former smoker  Orders: No orders of the defined types were placed in this encounter.  Meds  ordered this encounter  Medications  . predniSONE (DELTASONE) 5 MG tablet    Sig: Take 4 tablets by  mouth daily x1wk, 3 tablets by mouth daily x1wk, 2 tablets by mouth daily x1wk, 1 tablet by mouth daily x1wk.    Dispense:  70 tablet    Refill:  0  . valACYclovir (VALTREX) 1000 MG tablet    Sig: Take 1 tablet (1,000 mg total) by mouth 2 (two) times daily.    Dispense:  10 tablet    Refill:  0    Face-to-face time spent with patient was 35 minutes. Greater than 50% of time was spent in counseling and coordination of care.  Follow-Up Instructions: Return in about 3 months (around 10/22/2019) for Rheumatoid arthritis.   Gearldine Bienenstock, PA-C  Note - This record has been created using Dragon software.  Chart creation errors have been sought, but may not always  have been located. Such creation errors do not reflect on  the standard of medical care.

## 2019-07-20 LAB — CBC WITH DIFFERENTIAL/PLATELET
Absolute Monocytes: 298 cells/uL (ref 200–950)
Basophils Absolute: 28 cells/uL (ref 0–200)
Basophils Relative: 0.4 %
Eosinophils Absolute: 71 cells/uL (ref 15–500)
Eosinophils Relative: 1 %
HCT: 37.6 % (ref 35.0–45.0)
Hemoglobin: 12.1 g/dL (ref 11.7–15.5)
Lymphs Abs: 1690 cells/uL (ref 850–3900)
MCH: 26.5 pg — ABNORMAL LOW (ref 27.0–33.0)
MCHC: 32.2 g/dL (ref 32.0–36.0)
MCV: 82.3 fL (ref 80.0–100.0)
MPV: 9.4 fL (ref 7.5–12.5)
Monocytes Relative: 4.2 %
Neutro Abs: 5013 cells/uL (ref 1500–7800)
Neutrophils Relative %: 70.6 %
Platelets: 380 10*3/uL (ref 140–400)
RBC: 4.57 10*6/uL (ref 3.80–5.10)
RDW: 13.7 % (ref 11.0–15.0)
Total Lymphocyte: 23.8 %
WBC: 7.1 10*3/uL (ref 3.8–10.8)

## 2019-07-20 LAB — COMPLETE METABOLIC PANEL WITH GFR
AG Ratio: 1.6 (calc) (ref 1.0–2.5)
ALT: 12 U/L (ref 6–29)
AST: 12 U/L (ref 10–30)
Albumin: 3.9 g/dL (ref 3.6–5.1)
Alkaline phosphatase (APISO): 89 U/L (ref 31–125)
BUN: 16 mg/dL (ref 7–25)
CO2: 24 mmol/L (ref 20–32)
Calcium: 8.9 mg/dL (ref 8.6–10.2)
Chloride: 107 mmol/L (ref 98–110)
Creat: 0.6 mg/dL (ref 0.50–1.10)
GFR, Est African American: 140 mL/min/{1.73_m2} (ref >=60)
GFR, Est Non African American: 121 mL/min/{1.73_m2} (ref >=60)
Globulin: 2.4 g/dL (calc) (ref 1.9–3.7)
Glucose, Bld: 94 mg/dL (ref 65–99)
Potassium: 4.5 mmol/L (ref 3.5–5.3)
Sodium: 139 mmol/L (ref 135–146)
Total Bilirubin: 0.3 mg/dL (ref 0.2–1.2)
Total Protein: 6.3 g/dL (ref 6.1–8.1)

## 2019-07-20 NOTE — Progress Notes (Signed)
CMP and CBC WNL

## 2019-07-23 ENCOUNTER — Encounter: Payer: Self-pay | Admitting: *Deleted

## 2019-07-23 ENCOUNTER — Encounter: Payer: Self-pay | Admitting: Physician Assistant

## 2019-07-23 ENCOUNTER — Ambulatory Visit (INDEPENDENT_AMBULATORY_CARE_PROVIDER_SITE_OTHER): Payer: 59 | Admitting: Physician Assistant

## 2019-07-23 ENCOUNTER — Other Ambulatory Visit: Payer: Self-pay

## 2019-07-23 VITALS — BP 132/90 | HR 70 | Resp 16 | Ht 67.0 in | Wt 261.0 lb

## 2019-07-23 DIAGNOSIS — M0579 Rheumatoid arthritis with rheumatoid factor of multiple sites without organ or systems involvement: Secondary | ICD-10-CM

## 2019-07-23 DIAGNOSIS — F329 Major depressive disorder, single episode, unspecified: Secondary | ICD-10-CM

## 2019-07-23 DIAGNOSIS — Z87891 Personal history of nicotine dependence: Secondary | ICD-10-CM | POA: Diagnosis not present

## 2019-07-23 DIAGNOSIS — E7800 Pure hypercholesterolemia, unspecified: Secondary | ICD-10-CM | POA: Diagnosis not present

## 2019-07-23 DIAGNOSIS — F32A Depression, unspecified: Secondary | ICD-10-CM

## 2019-07-23 DIAGNOSIS — F419 Anxiety disorder, unspecified: Secondary | ICD-10-CM

## 2019-07-23 DIAGNOSIS — Z8639 Personal history of other endocrine, nutritional and metabolic disease: Secondary | ICD-10-CM | POA: Diagnosis not present

## 2019-07-23 DIAGNOSIS — Z79899 Other long term (current) drug therapy: Secondary | ICD-10-CM

## 2019-07-23 MED ORDER — FOLIC ACID 1 MG PO TABS: 2.0000 mg | ORAL_TABLET | Freq: Every day | ORAL | 2 refills | Status: DC

## 2019-07-23 MED ORDER — VALACYCLOVIR HCL 1 G PO TABS: 1000.0000 mg | ORAL_TABLET | Freq: Two times a day (BID) | ORAL | 0 refills | Status: DC

## 2019-07-23 MED ORDER — XELJANZ XR 11 MG PO TB24: 1.0000 | ORAL_TABLET | Freq: Every day | ORAL | 2 refills | Status: DC

## 2019-07-23 MED ORDER — PREDNISONE 5 MG PO TABS: ORAL_TABLET | ORAL | 0 refills | Status: DC

## 2019-07-23 MED ORDER — METHOTREXATE 2.5 MG PO TABS: ORAL_TABLET | ORAL | 0 refills | Status: DC

## 2019-07-23 NOTE — Addendum Note (Signed)
Addended by: Ellen Henri on: 07/23/2019 03:56 PM   Modules accepted: Orders

## 2019-07-23 NOTE — Patient Instructions (Signed)
Standing Labs We placed an order today for your standing lab work.    Please come back and get your standing labs in July and every 3 months   We have open lab daily Monday through Thursday from 8:30-12:30 PM and 1:30-4:30 PM and Friday from 8:30-12:30 PM and 1:30-4:00 PM at the office of Dr. Shaili Deveshwar.   You may experience shorter wait times on Monday and Friday afternoons. The office is located at [address], [address], [place], [place] [address] No appointment is necessary.   Labs are drawn by [facility].  You may receive a bill from [facility] for your lab work.  If you wish to have your labs drawn at another location, please call the office 24 hours in advance to send orders.  If you have any questions regarding directions or hours of operation,  please call [phone].   Just as a reminder please drink plenty of water prior to coming for your lab work. Thanks!   

## 2019-07-23 NOTE — Progress Notes (Signed)
Medication Samples have been provided to the patient.  Drug name: [name] XR Strength: 11 mg Qty: 30 tablets   LOT: [id number]   Exp.Date: 07/2019  Dosing instructions: Take 1 tablet daily  The patient has been instructed regarding the correct time, dose, and frequency of taking this medication, including desired effects and most common side effects.   Verlin Fester, PharmD, [place], CPP Clinical Specialty Pharmacist [phone]  07/23/2019 10:25 AM

## 2019-09-11 DIAGNOSIS — F902 Attention-deficit hyperactivity disorder, combined type: Secondary | ICD-10-CM | POA: Diagnosis not present

## 2019-09-11 DIAGNOSIS — F4310 Post-traumatic stress disorder, unspecified: Secondary | ICD-10-CM | POA: Diagnosis not present

## 2019-09-27 ENCOUNTER — Encounter: Payer: Self-pay | Admitting: Rheumatology

## 2019-09-27 NOTE — Telephone Encounter (Signed)
Attempted to contact the patient and left message for patient to call the office.  

## 2019-10-04 ENCOUNTER — Telehealth: Payer: Self-pay | Admitting: *Deleted

## 2019-10-04 NOTE — Telephone Encounter (Signed)
Received a fax from [facility] stating they have made several attempts to contact the patient to fill her Lori Valentine. They have been unsuccessful. CAll back number [phone]

## 2019-10-09 NOTE — Progress Notes (Signed)
Office Visit Note  Patient: Lori Valentine             Date of Birth: [birth year]           MRN: [id number]             PCP: Leeroy Bock, DO Referring: Oralia Manis, DO Visit Date: 10/22/2019 Occupation: @GUAROCC @  Subjective:  Pain in multiple joints   History of Present Illness: Tyna Huertas is a 34 y.o. female with history of seropositive rheumatoid arthritis.  She is taking xeljanz 11 mg XR daily, MTX 8 tablets by mouth once weekly, and folic acid 2 mg po daily.  She reports she has been having frequent flares, and she is unable to perform ADLs.  She was taking motrin for pain relief, but she developed gastritis and had to discontinue.  She states she has been taking tylenol for pain relief but continues to have chronic pain and inflammation.  She has difficulty ambulating long distances and has severe pain climbing steps.  She has had 3 doses of MTX since resuming it, and she has been tolerating it better.  She has intermittent pain in both TMJs, both hands, and both feet.    Activities of Daily Living:  Patient reports joint stiffness all day  Patient Reports nocturnal pain.  Difficulty dressing/grooming: Reports Difficulty climbing stairs: Reports Difficulty getting out of chair: Reports Difficulty using hands for taps, buttons, cutlery, and/or writing: Reports  Review of Systems  Constitutional: Positive for fatigue.  HENT: Positive for mouth sores. Negative for mouth dryness and nose dryness.   Eyes: Negative for pain, itching, visual disturbance and dryness.  Respiratory: Negative for cough, hemoptysis, shortness of breath and difficulty breathing.   Cardiovascular: Negative for chest pain, palpitations, hypertension and swelling in legs/feet.  Gastrointestinal: Negative for blood in stool, constipation and diarrhea.  Genitourinary: Negative for painful urination.  Musculoskeletal: Positive for arthralgias, joint pain, joint swelling, myalgias, morning stiffness,  muscle tenderness and myalgias. Negative for muscle weakness.  Skin: Negative for color change, pallor, rash, hair loss, nodules/bumps, redness, skin tightness, ulcers and sensitivity to sunlight.  Allergic/Immunologic: Positive for susceptible to infections.  Neurological: Negative for dizziness, numbness, headaches, memory loss and weakness.  Hematological: Positive for bruising/bleeding tendency. Negative for swollen glands.  Psychiatric/Behavioral: Negative for depressed mood, confusion and sleep disturbance. The patient is not nervous/anxious.     PMFS History:  Patient Active Problem List   Diagnosis Date Noted  . Difficulty concentrating 02/03/2018  . Anemia 09/08/2017  . Normal labor 08/04/2017  . Severe obesity (BMI >= 40) (HCC) 07/28/2017  . Obesity in pregnancy, antepartum 03/11/2017  . Maternal rheumatoid arthritis complicating pregnancy (HCC) 03/11/2017  . Supervision of high-risk pregnancy 02/08/2017  . 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 History of anxiety and depression 10/22/2016  . Smoker 10/22/2016  . Rheumatoid arthritis (HCC) 05/13/2015  . Hypothyroidism 07/29/2014    Past Medical History:  Diagnosis Date  . Hypothyroid   . Rheumatoid arthritis (HCC)   . 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 Schizophrenia Mother   . Bipolar disorder Mother   . Diabetes Other   . Heart disease Other   . Diabetes Sister   . Healthy Son    Past Surgical History:  Procedure Laterality Date  . CESAREAN  SECTION N/A 08/04/2017   Procedure: CESAREAN SECTION;  Surgeon: Hermina Staggers, MD;  Location: [facility] BIRTHING SUITES;  Service: Obstetrics;  Laterality: N/A;  . NO PAST SURGERIES    . TUBAL LIGATION Bilateral 08/04/2017   Procedure: BILATERAL TUBAL LIGATION;  Surgeon: Hermina Staggers, MD;  Location: [facility] BIRTHING SUITES;  Service: Obstetrics;  Laterality: Bilateral;   Social History   Social History Narrative   15 year old son   Lives with  Sister and nephew and son.   Works as Sales executive at [employer] History  Administered Date(s) Administered  . Influenza,inj,Quad PF,6+ Mos 05/13/2015, 12/29/2016  . Influenza-Unspecified 01/12/2018  . Moderna SARS-COVID-2 Vaccination 05/28/2019, 07/03/2019  . Tdap 05/13/2015, 06/15/2017     Objective: Vital Signs: BP 131/85 (BP Location: Left Arm, Patient Position: Sitting, Cuff Size: Normal)   Pulse 85   Resp 17   Ht  (1.702 m)   Wt 260 lb (117.9 kg)   BMI 40.72 kg/m    Physical Exam Vitals and nursing note reviewed.  Constitutional:      Appearance: She is well-developed.  HENT:     Head: Normocephalic and atraumatic.  Eyes:     Conjunctiva/sclera: Conjunctivae normal.  Pulmonary:     Effort: Pulmonary effort is normal.  Abdominal:     General: Bowel sounds are normal.     Palpations: Abdomen is soft.  Musculoskeletal:     Cervical back: Normal range of motion.  Lymphadenopathy:     Cervical: No cervical adenopathy.  Skin:    General: Skin is warm and dry.     Capillary Refill: Capillary refill takes less than 2 seconds.  Neurological:     Mental Status: She is alert and oriented to person, place, and time.  Psychiatric:        Behavior: Behavior normal.      Musculoskeletal Exam:  C-spine, thoracic spine, and lumbar spine good ROM.  Shoulder joints good ROM with no discomfort.  Right elbow joint tenderness and inflammation. Limited ROM of both wrist joints.  Synovial thickening and warmth of both  wrist joints. Synovitis of right 3rd MCP and left 2nd MCP joint. Hip joints good ROM with no discomfort. Knee joints good ROM with no warmth or effusion.  Limited ROM of the left ankle joint.  Tenderness of both ankle joints.  Tenderness of all MTP joints.    CDAI Exam: CDAI Score: -- Patient Global: --; Provider Global: -- Swollen: 4 ; Tender: 17  Joint Exam 10/22/2019      Right  Left  Elbow   Tender     Wrist  Swollen Tender  Swollen Tender  MCP 2     Swollen Tender  MCP 3  Swollen Tender     Ankle   Tender   Tender  MTP 1   Tender   Tender  MTP 2   Tender   Tender  MTP 3   Tender   Tender  MTP 4   Tender   Tender  MTP  5   Tender   Tender     Investigation: No additional findings.  Imaging: XR Foot 2 Views Left  Result Date: 10/22/2019 First MTP, PIP and DIP narrowing was noted.  Narrowing of most MTP joints was noted.  Erosive changes were noted in the second and third and fifth MTP joints.  No intertarsal or tibiotalar joint space narrowing was noted.  A small calcaneal spur was noted.  Increased erosive changes were noted in the MTP joints when compared to the x-rays of 2018. Impression: These findings are consistent with erosive rheumatoid arthritis and radiographic progression when compared to the x-rays of 2018.  XR Foot 2 Views Right  Result Date: 10/22/2019 First MTP, PIP and DIP narrowing was noted.  Erosive changes were noted in the first PIP, second third fourth and fifth MTP joints with severe narrowing of the fourth and fifth MTP joints.  No intertarsal or tibiotalar joint space narrowing is noted.  Radiographic progression was noted when compared to the x-rays of 2018 with increase erosions in the fourth and fifth MTP joints. Impression: Severe erosive rheumatoid arthritis of the foot with radiographic progression when compared to the x-rays of 2018.  XR Hand 2 View Left  Result Date: 10/22/2019 Juxta-articular osteopenia.  Narrowing of MCPs PIPs and DIPs was  noted.  Severe narrowing of the proximal and radiocarpal joint space was noted.  Erosive changes were noted in the carpal bones and radius.  Radiographic progression with increase in the intercarpal radiocarpal joint space narrowing was noted when compared with x-rays of 2018. Impression: These findings are consistent with severe erosive rheumatoid arthritis and radiographic progression.  XR Hand 2 View Right  Result Date: 10/22/2019 Juxta-articular osteopenia was noted.  PIP and DIP narrowing was noted.  Intercarpal and radiocarpal joint space narrowing was noted.  Cystic and erosive changes are noted in the carpal bones.  Increased intercarpal and radiocarpal joint space narrowing was noted when compared to the x-rays of 2018. Impression: These findings are consistent with erosive rheumatoid arthritis and radiographic progression.   Recent Labs: Lab Results  Component Value Date   WBC 7.1 07/19/2019   HGB 12.1 07/19/2019   PLT 380 07/19/2019   NA 139 07/19/2019   K 4.5 07/19/2019   CL 107 07/19/2019   CO2 24 07/19/2019   GLUCOSE 94 07/19/2019   BUN 16 07/19/2019   CREATININE 0.60 07/19/2019   BILITOT 0.3 07/19/2019   ALKPHOS 253 (H) 08/04/2017   AST 12 07/19/2019   ALT 12 07/19/2019   PROT 6.3 07/19/2019   ALBUMIN 2.5 (L) 08/04/2017   CALCIUM 8.9 07/19/2019   GFRAA 140 07/19/2019   QFTBGOLD Negative 07/07/2016   QFTBGOLDPLUS NEGATIVE 11/28/2018    Speciality Comments: Prior therapy: Enbrel, Cimzia, Remicade, Plaquenil, and MTX monotherapy  Procedures:  No procedures performed Allergies: Effexor [venlafaxine]   Assessment / Plan:     Visit Diagnoses: Rheumatoid arthritis involving multiple sites with positive rheumatoid factor (HCC) - Prior therapy: Enbrel, Cimzia, Remicade, Plaquenil, and MTX monotherapy:  She has tenderness and synovitis of multiple joints including both wrist joints, right 3rd MCP joint, and left 2nd MCP joint. She has limited ROM of the left ankle and  tenderness bilaterally. Tenderness of all MTP joints noted.  X-rays of both hands and both feet were updated today.  She was found to have radiographic progression and erosive changes.  She is currently taking Xeljanz 11 mg XR daily and restarted on MTX 8 tablets by mouth once weekly and  folic acid 2 mg po daily about 3 weeks ago.  She has had several bouts of gastritis and discontinued motrin use.  She has been taking tylenol but continues to have severe pain and inflammation. She has been having significant difficulty with ADLs.  Different treatment options were discussed today.  She is apprehensive to switch to injectable MTX.  We discussed switching her from [place] to rinvoq.  She will take rinvoq 15 mg 1 tablet by mouth daily.  Indications, contraindications, and potential side effects of Rinvoq were discussed.  All questions were addressed and consent was obtained today.  We will apply for rinvoq and she will discontinue xeljanz. She will continue taking MTX as prescribed. We will send in a prednisone taper starting at 20 mg tapering by 5 mg every week. She will follow up in 6 weeks to assess her response.   - Plan: XR Hand 2 View Right, XR Hand 2 View Left, XR Foot 2 Views Right, XR Foot 2 Views Left  High risk medication use - Applying for Rinvoq 15 mg 1 tablet by mouth daily, methotrexate 8 tablets by mouth once weekly and folic acid 2 mg by mouth daily.  CBC and CMP were drawn on 07/19/2019.  She is due to update lab work today.  Orders for CBC and CMP were released.  TB gold was negative on 11/28/2018.  A future order for TB gold was placed today.  A future order for lipid panel was also placed. She previously had an inadequate response to Enbrel, Cimzia, Remicade, Plaquenil, and methotrexate monotherapy.  She will be discontinuing Xeljanz and starting on Rinvoq- Plan: COMPLETE METABOLIC PANEL WITH GFR, CBC with Differential/Platelet, QuantiFERON-TB Gold Plus, Lipid panel  Elevated LDL cholesterol  level -Future order for lipid panel was placed today. Plan: Lipid panel  Former smoker: She continues to smoke.  We discussed the importance of tobacco cessation.    Association of heart disease with rheumatoid arthritis was discussed. Need to monitor blood pressure, cholesterol, and to exercise 30-60 minutes on daily basis was discussed. Poor dental hygiene can be a predisposing factor for rheumatoid arthritis. Good dental hygiene was discussed.  Screening for tuberculosis -TB gold negative on 11/28/18.  Future order was placed today.   Plan: QuantiFERON-TB Gold Plus  Other medical conditions are listed as follows:   Anxiety and depression  History of hypothyroidism  Orders: Orders Placed This Encounter  Procedures  . XR Hand 2 View Right  . XR Hand 2 View Left  . XR Foot 2 Views Right  . XR Foot 2 Views Left  . COMPLETE METABOLIC PANEL WITH GFR  . CBC with Differential/Platelet  . QuantiFERON-TB Gold Plus  . Lipid panel   Meds ordered this encounter  Medications  . predniSONE (DELTASONE) 5 MG tablet    Sig: Take 4 tablets by mouth daily x1 week, 3 tablets by mouth daily x1 week, 2 tablets by mouth daily x1 week, 1 tablet by mouth daily x1 week.    Dispense:  70 tablet    Refill:  0    Face-to-face time spent with patient was 30 minutes. Greater than 50% of time was spent in counseling and coordination of care.  Follow-Up Instructions: Return in about 6 weeks (around 12/03/2019) for Rheumatoid arthritis.   Sherron Ales, PA-C  I examined and evaluated the patient with Sherron Ales PA.  I reviewed the x-ray findings with the patient.  She has radiographic progression over the last few years.  She has  had intermittent treatment with the medications.  She has been on [place] for the last 2 years.  We will switch her to Rinvoq.  Indications side effects of medications were discussed at length.  The plan of care was discussed as noted above.  Pollyann Savoy, MD  Note - This  record has been created using Animal nutritionist.  Chart creation errors have been sought, but may not always  have been located. Such creation errors do not reflect on  the standard of medical care.

## 2019-10-12 NOTE — Telephone Encounter (Signed)
Attempted to contact the patient and left message to advised patient we have received a fax from [facility] stating they have made several attempts to contact the patient to fill her [name]. They have been unsuccessful. Call back number [phone].

## 2019-10-22 ENCOUNTER — Telehealth: Payer: Self-pay | Admitting: Pharmacist

## 2019-10-22 ENCOUNTER — Ambulatory Visit: Payer: Self-pay

## 2019-10-22 ENCOUNTER — Other Ambulatory Visit: Payer: Self-pay

## 2019-10-22 ENCOUNTER — Ambulatory Visit (INDEPENDENT_AMBULATORY_CARE_PROVIDER_SITE_OTHER): Payer: 59 | Admitting: Rheumatology

## 2019-10-22 ENCOUNTER — Encounter: Payer: Self-pay | Admitting: Physician Assistant

## 2019-10-22 VITALS — BP 131/85 | HR 85 | Resp 17 | Ht 67.0 in | Wt 260.0 lb

## 2019-10-22 DIAGNOSIS — E7800 Pure hypercholesterolemia, unspecified: Secondary | ICD-10-CM

## 2019-10-22 DIAGNOSIS — M79671 Pain in right foot: Secondary | ICD-10-CM | POA: Diagnosis not present

## 2019-10-22 DIAGNOSIS — Z79899 Other long term (current) drug therapy: Secondary | ICD-10-CM | POA: Diagnosis not present

## 2019-10-22 DIAGNOSIS — Z8639 Personal history of other endocrine, nutritional and metabolic disease: Secondary | ICD-10-CM

## 2019-10-22 DIAGNOSIS — M0579 Rheumatoid arthritis with rheumatoid factor of multiple sites without organ or systems involvement: Secondary | ICD-10-CM

## 2019-10-22 DIAGNOSIS — Z87891 Personal history of nicotine dependence: Secondary | ICD-10-CM

## 2019-10-22 DIAGNOSIS — F329 Major depressive disorder, single episode, unspecified: Secondary | ICD-10-CM | POA: Diagnosis not present

## 2019-10-22 DIAGNOSIS — M79642 Pain in left hand: Secondary | ICD-10-CM

## 2019-10-22 DIAGNOSIS — Z111 Encounter for screening for respiratory tuberculosis: Secondary | ICD-10-CM | POA: Diagnosis not present

## 2019-10-22 DIAGNOSIS — M79672 Pain in left foot: Secondary | ICD-10-CM

## 2019-10-22 DIAGNOSIS — M79641 Pain in right hand: Secondary | ICD-10-CM

## 2019-10-22 DIAGNOSIS — F419 Anxiety disorder, unspecified: Secondary | ICD-10-CM | POA: Diagnosis not present

## 2019-10-22 DIAGNOSIS — F32A Depression, unspecified: Secondary | ICD-10-CM

## 2019-10-22 MED ORDER — PREDNISONE 5 MG PO TABS
ORAL_TABLET | ORAL | 0 refills | Status: DC
Start: 2019-10-22 — End: 2020-02-04

## 2019-10-22 NOTE — Patient Instructions (Addendum)
Standing Labs We placed an order today for your standing lab work.   Please have your standing labs drawn in 1 month and every 3 months   If possible, please have your labs drawn 2 weeks prior to your appointment so that the provider can discuss your results at your appointment.  We have open lab daily Monday through Thursday from 8:30-12:30 PM and 1:30-4:30 PM and Friday from 8:30-12:30 PM and 1:30-4:00 PM at the office of Dr. Pollyann Savoy, [facility].   Please be advised, patients with office appointments requiring lab work will take precedents over walk-in lab work.  If possible, please come for your lab work on Monday and Friday afternoons, as you may experience shorter wait times. The office is located at [address], [address], [place], [place] [address] No appointment is necessary.   Labs are drawn by [facility]. Please bring your co-pay at the time of your lab draw.  You may receive a bill from [facility] for your lab work.  If you wish to have your labs drawn at another location, please call the office 24 hours in advance to send orders.  If you have any questions regarding directions or hours of operation,  please call [phone].   As a reminder, please drink plenty of water prior to coming for your lab work. Thanks!

## 2019-10-22 NOTE — Progress Notes (Signed)
Pharmacy Note  Subjective: Patient presents today to [facility] for follow up office visit. Patient seen by the pharmacist for counseling on Rinvoq for rheumatoid arthritis..   Objective:  CMP     Component Value Date/Time   NA 139 07/19/2019 1026   NA 137 03/11/2017 1026   K 4.5 07/19/2019 1026   CL 107 07/19/2019 1026   CO2 24 07/19/2019 1026   GLUCOSE 94 07/19/2019 1026   BUN 16 07/19/2019 1026   BUN 6 03/11/2017 1026   CREATININE 0.60 07/19/2019 1026   CALCIUM 8.9 07/19/2019 1026   PROT 6.3 07/19/2019 1026   PROT 5.9 (L) 03/11/2017 1026   ALBUMIN 2.5 (L) 08/04/2017 0230   ALBUMIN 3.5 03/11/2017 1026   AST 12 07/19/2019 1026   ALT 12 07/19/2019 1026   ALKPHOS 253 (H) 08/04/2017 0230    CBC    Component Value Date/Time   WBC 7.1 07/19/2019 1026   RBC 4.57 07/19/2019 1026   HGB 12.1 07/19/2019 1026   HGB 8.6 (L) 07/28/2017 0857   HCT 37.6 07/19/2019 1026   HCT 26.9 (L) 08/03/2017 1524   PLT 380 07/19/2019 1026   PLT 168 07/28/2017 0857   MCV 82.3 07/19/2019 1026   MCV 76 (L) 07/28/2017 0857   MCH 26.5 (L) 07/19/2019 1026   MCHC 32.2 07/19/2019 1026   RDW 13.7 07/19/2019 1026   RDW 16.9 (H) 07/28/2017 0857    Baseline Immunosuppressant Therapy Labs TB GOLD Quantiferon TB Gold Latest Ref Rng & Units 11/28/2018  Quantiferon TB Gold Plus NEGATIVE NEGATIVE   Hepatitis Panel Hepatitis Latest Ref Rng & Units 02/08/2017  Hep B Surface Ag Negative Negative   HIV Lab Results  Component Value Date   HIV Non Reactive 06/15/2017   HIV Non Reactive 02/08/2017   HIV Non Reactive 12/29/2016   Immunoglobulins   SPEP Serum Protein Electrophoresis Latest Ref Rng & Units 07/19/2019  Total Protein 6.1 - 8.1 g/dL 6.3   V7QI Lab Results  Component Value Date   G6PDH 18.4 04/18/2017   TPMT No results found for: TPMT   Lipid Panel Lab Results  Component Value Date   CHOL 200 (H) 11/28/2018   HDL 31 (L) 11/28/2018   LDLCALC 143 (H) 11/28/2018    TRIG 137 11/28/2018   CHOLHDL 6.5 (H) 11/28/2018     Does patient have history of diverticulitis?  No  Assessment/Plan:  Counseled patient that Rinvoq is a JAK inhibitor indicated for Rheumatoid Arthritis.  Counseled patient on purpose, proper use, and adverse effects of Rinvoq.    Reviewed the most common adverse effects including infection, diarrhea, headaches.  Also reviewed rare adverse effects such as bowel injury and the need to contact us if they develop stomach pain during treatment. Counseled on the increase risk of venous thrombosis. Reviewed with patient that there is the possibility of an increased risk of malignancy but it is not well understood if this increased risk is due to the medication or the disease state.  Instructed patient that medication should be held for infection and prior to surgery.  Advised patient to avoid live vaccines. Recommend annual influenza, Pneumovax 23, Prevnar 13, and Shingrix as indicated.    Reviewed the importance of routine lab monitoring including lipid panel.  Standing orders placed. Provided patient with medication education material and answered all questions.  Patient consented to Rinvoq.  Will upload into patient's chart.  Will apply through patient's insurance and update when we receive a response.  Patient  dose will be 15 mg daily.  Prescription will be sent to pharmacy pending lab results and insurance approval.  All questions encouraged and answered.  Instructed patient to call with any questions or concerns.  Verlin Fester, PharmD, [place], CPP Clinical Specialty Pharmacist (Rheumatology and Pulmonology)  10/22/2019 9:02 AM

## 2019-10-22 NOTE — Telephone Encounter (Signed)
Please start BIV for Rinvoq for the treatment of rheumatoid arthritis.  She has tried and failed Xeljanz, oral and injectable methotrexate,  Enbrel, Cimzia, Remicade, Plaquenil.   Verlin Fester, PharmD, [place], CPP Clinical Specialty Pharmacist (Rheumatology and Pulmonology)  10/22/2019 9:05 AM

## 2019-10-22 NOTE — Telephone Encounter (Signed)
Submitted a Prior Authorization request to [employer] for [employer] via [employer]. Will update once we receive a response.   [id number]

## 2019-10-23 LAB — CBC WITH DIFFERENTIAL/PLATELET
Absolute Monocytes: 220 cells/uL (ref 200–950)
Basophils Absolute: 22 cells/uL (ref 0–200)
Basophils Relative: 0.4 %
Eosinophils Absolute: 72 cells/uL (ref 15–500)
Eosinophils Relative: 1.3 %
HCT: 37.4 % (ref 35.0–45.0)
Hemoglobin: 12.3 g/dL (ref 11.7–15.5)
Lymphs Abs: 2486 cells/uL (ref 850–3900)
MCH: 27.2 pg (ref 27.0–33.0)
MCHC: 32.9 g/dL (ref 32.0–36.0)
MCV: 82.7 fL (ref 80.0–100.0)
MPV: 9.1 fL (ref 7.5–12.5)
Monocytes Relative: 4 %
Neutro Abs: 2701 cells/uL (ref 1500–7800)
Neutrophils Relative %: 49.1 %
Platelets: 350 10*3/uL (ref 140–400)
RBC: 4.52 10*6/uL (ref 3.80–5.10)
RDW: 15 % (ref 11.0–15.0)
Total Lymphocyte: 45.2 %
WBC: 5.5 10*3/uL (ref 3.8–10.8)

## 2019-10-23 LAB — COMPLETE METABOLIC PANEL WITH GFR
AG Ratio: 1.4 (calc) (ref 1.0–2.5)
ALT: 14 U/L (ref 6–29)
AST: 13 U/L (ref 10–30)
Albumin: 3.9 g/dL (ref 3.6–5.1)
Alkaline phosphatase (APISO): 88 U/L (ref 31–125)
BUN: 10 mg/dL (ref 7–25)
CO2: 24 mmol/L (ref 20–32)
Calcium: 8.7 mg/dL (ref 8.6–10.2)
Chloride: 108 mmol/L (ref 98–110)
Creat: 0.61 mg/dL (ref 0.50–1.10)
GFR, Est African American: 138 mL/min/{1.73_m2} (ref >=60)
GFR, Est Non African American: 119 mL/min/{1.73_m2} (ref >=60)
Globulin: 2.7 g/dL (calc) (ref 1.9–3.7)
Glucose, Bld: 96 mg/dL (ref 65–99)
Potassium: 4.6 mmol/L (ref 3.5–5.3)
Sodium: 139 mmol/L (ref 135–146)
Total Bilirubin: 0.3 mg/dL (ref 0.2–1.2)
Total Protein: 6.6 g/dL (ref 6.1–8.1)

## 2019-10-23 MED ORDER — RINVOQ 15 MG PO TB24: 1.0000 | ORAL_TABLET | Freq: Every day | ORAL | 2 refills | Status: DC

## 2019-10-23 NOTE — Telephone Encounter (Signed)
Called to notify patient that Rinvoq was approved and prescription sent to [facility] specialty pharmacy along with co-pay card.  Called twice and patient picked up call then hung up for both attempts.  Unable to leave message.   Verlin Fester, PharmD, [place], CPP Clinical Specialty Pharmacist (Rheumatology and Pulmonology)  10/23/2019 9:44 AM

## 2019-10-23 NOTE — Telephone Encounter (Signed)
Received notification from [facility] regarding a prior authorization for [facility]. Authorization has been APPROVED from 10/22/19 to 10/21/20.   Authorization # [id number]  Per plan, patient must fill through [facility].  Rinvoq copay card info:  Card: [id number]  Issued: 10/23/2019  Rx GROUP: [id number]  Rx BIN: [id number]  Rx PCN: [id number]  Suf: 01

## 2019-10-23 NOTE — Progress Notes (Signed)
CBC and CMP WNL

## 2019-10-23 NOTE — Telephone Encounter (Signed)
Patient called back, and message was relayed to her. Patient understands, and has no questions at this time.

## 2019-11-20 ENCOUNTER — Other Ambulatory Visit: Payer: 59

## 2020-01-30 ENCOUNTER — Encounter: Payer: Self-pay | Admitting: Physician Assistant

## 2020-01-30 LAB — TB SKIN TEST: TB Skin Test: NEGATIVE

## 2020-02-01 ENCOUNTER — Other Ambulatory Visit: Payer: Self-pay | Admitting: Pharmacist

## 2020-02-01 DIAGNOSIS — M0579 Rheumatoid arthritis with rheumatoid factor of multiple sites without organ or systems involvement: Secondary | ICD-10-CM

## 2020-02-01 NOTE — Telephone Encounter (Signed)
Routing refill request

## 2020-02-01 NOTE — Progress Notes (Signed)
Office Visit Note  Patient: Lori Valentine             Date of Birth: 22-Feb-1986           MRN: [id number]             PCP: Leeroy Bock, DO Referring: Leeroy Bock, DO Visit Date: 02/04/2020 Occupation: @GUAROCC @  Subjective:  Discuss switching to injectable MTX   History of Present Illness: Lori Valentine is a 34 y.o. female with history of seropositive rheumatoid arthritis.  She is taking rinvoq 15 mg 1 tablet by mouth daily.  She has found rinvoq to be significantly more effective than xeljanz.  She states she has not been compliant taking MTX recently due to forgetting doses. She would like to switch to the injectable form of MTX, which she plans on having a coworker inject for her once weekly.  She denies any flares since her last office visit.   She reports increased pain in both knee joints, which she attributes to weight gain.  She denies any warmth or swelling in her knee joints at this time.  She has noticed chronic swelling in the right 5th PIP joint but denies any joint tenderness at this time.     Activities of Daily Living:  Patient reports morning stiffness for 3 hours.   Patient Reports nocturnal pain.  Difficulty dressing/grooming: Reports Difficulty climbing stairs: Reports Difficulty getting out of chair: Reports Difficulty using hands for taps, buttons, cutlery, and/or writing: Reports  Review of Systems  Constitutional: Positive for fatigue.  HENT: Negative for mouth sores, mouth dryness and nose dryness.   Eyes: Negative for pain, visual disturbance and dryness.  Respiratory: Negative for cough, hemoptysis, shortness of breath and difficulty breathing.   Cardiovascular: Positive for swelling in legs/feet. Negative for chest pain, palpitations and hypertension.  Gastrointestinal: Negative for blood in stool, constipation and diarrhea.  Endocrine: Negative for increased urination.  Genitourinary: Negative for difficulty urinating and painful urination.   Musculoskeletal: Positive for arthralgias, joint pain, joint swelling, muscle weakness, morning stiffness and muscle tenderness. Negative for myalgias and myalgias.  Skin: Negative for color change, pallor, rash, hair loss, nodules/bumps, skin tightness, ulcers and sensitivity to sunlight.  Allergic/Immunologic: Positive for susceptible to infections.  Neurological: Negative for dizziness, numbness, headaches and weakness.  Hematological: Negative for bruising/bleeding tendency and swollen glands.  Psychiatric/Behavioral: Positive for sleep disturbance. Negative for depressed mood. The patient is not nervous/anxious.     PMFS History:  Patient Active Problem List   Diagnosis Date Noted  . Difficulty concentrating 02/03/2018  . Anemia 09/08/2017  . Normal labor 08/04/2017  . Severe obesity (BMI >= 40) (HCC) 07/28/2017  . Obesity in pregnancy, antepartum 03/11/2017  . Maternal rheumatoid arthritis complicating pregnancy (HCC) 03/11/2017  . Supervision of high-risk pregnancy 02/08/2017  . 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 History of anxiety and depression 10/22/2016  . Smoker 10/22/2016  . Rheumatoid arthritis (HCC) 05/13/2015  . Hypothyroidism 07/29/2014    Past Medical History:  Diagnosis Date  . Hypothyroid   . Rheumatoid arthritis (HCC)   . 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 Schizophrenia Mother   . Bipolar disorder Mother   . Diabetes Other   . Heart disease Other   . Diabetes Sister   . Healthy  Son    Past Surgical History:  Procedure Laterality Date  . CESAREAN SECTION N/A 08/04/2017   Procedure: CESAREAN SECTION;  Surgeon: Hermina Staggers, MD;  Location: [facility] BIRTHING SUITES;  Service: Obstetrics;  Laterality: N/A;  . NO PAST SURGERIES    . TUBAL LIGATION Bilateral 08/04/2017   Procedure: BILATERAL TUBAL LIGATION;  Surgeon: Hermina Staggers, MD;  Location: [facility] BIRTHING SUITES;  Service: Obstetrics;  Laterality: Bilateral;   Social History   Social History Narrative   73 year old son   Lives with  Sister and nephew and son.   Works as Sales executive at [employer] History  Administered Date(s) Administered  . Influenza,inj,Quad PF,6+ Mos 05/13/2015, 12/29/2016  . Influenza-Unspecified 01/12/2018  . Moderna SARS-COVID-2 Vaccination 05/28/2019, 07/03/2019  . Tdap 05/13/2015, 06/15/2017     Objective: Vital Signs: BP 126/84 (BP Location: Left Arm, Patient Position: Sitting, Cuff Size: Normal)   Pulse 94   Resp 16   Ht 5\' 7"  (1.702 m)   Wt 280 lb (127 kg)   BMI 43.85 kg/m    Physical Exam Vitals and nursing note reviewed.  Constitutional:      Appearance: She is well-developed.  HENT:     Head: Normocephalic and atraumatic.  Eyes:     Conjunctiva/sclera: Conjunctivae normal.  Cardiovascular:     Rate and Rhythm: Normal rate.  Pulmonary:     Effort: Pulmonary effort is normal.  Abdominal:     Palpations: Abdomen is soft.  Musculoskeletal:     Cervical back: Normal range of motion.  Skin:    General: Skin is warm and dry.     Capillary Refill: Capillary refill takes less than 2 seconds.  Neurological:     Mental Status: She is alert and oriented to person, place, and time.  Psychiatric:        Behavior: Behavior normal.      Musculoskeletal Exam: C-spine, thoracic spine, and lumbar spine good ROM.  Shoulder joints and elbow joints good ROM with no discomfort.  Limited ROM of both wrist joints.  Thickening of the right 1st-3rd MCP joints.   Tenderness and synovitis of the right 5th PIP joint. Hip joints good ROM with some discomfort in the right hip.  Knee joints have painful ROM with no warmth or effusion.  Ankle joints good ROM with no tenderness or inflammation.    CDAI Exam: CDAI Score: 4.8  Patient Global: 4 mm; Provider Global: 4 mm Swollen: 1 ; Tender: 3  Joint Exam 02/04/2020      Right  Left  PIP 5  Swollen Tender     Knee   Tender   Tender     Investigation: No additional findings.  Imaging: No results found.  Recent Labs: Lab Results  Component Value Date   WBC 5.5 10/22/2019   HGB 12.3 10/22/2019   PLT 350 10/22/2019   NA 139 10/22/2019   K 4.6 10/22/2019   CL 108 10/22/2019   CO2 24 10/22/2019   GLUCOSE 96 10/22/2019  BUN 10 10/22/2019   CREATININE 0.61 10/22/2019   BILITOT 0.3 10/22/2019   ALKPHOS 253 (H) 08/04/2017   AST 13 10/22/2019   ALT 14 10/22/2019   PROT 6.6 10/22/2019   ALBUMIN 2.5 (L) 08/04/2017   CALCIUM 8.7 10/22/2019   GFRAA 138 10/22/2019   QFTBGOLD Negative 07/07/2016   QFTBGOLDPLUS NEGATIVE 11/28/2018    Speciality Comments: Prior therapy: Enbrel, Cimzia, Remicade, Plaquenil, and MTX monotherapy  Procedures:  No procedures performed Allergies: Effexor [venlafaxine]   Assessment / Plan:     Visit Diagnoses: Rheumatoid arthritis involving multiple sites with positive rheumatoid factor (HCC): She has synovitis of the right 5th PIP joint, limited ROM of both wrist joints, and painful ROM of both knee joints on exam.  No warmth or effusion of knee joints noted.  She is currently taking rinvoq 15 mg 1 tablet by mouth daily and is tolerating it without any side effects. She has noticed signficiatn clinical improvement since switching from [place] to rinvoq. She has been noncompliant taking MTX 8 tablets once weekly due to forgetting doses.  She would like to switch to injectable vial and syringe methotrexate 0.8 ml sq injections once weekly.  She will continue taking folic acid  2 mg daily. She was given an instructional handout today on how to perform these injections.  She will continue taking rinvoq as prescribed. She was advised to notify us if her knee joint pain persists or worsens. The importance of weight loss and exercise were discussed.  She will follow up in 5 months.   High risk medication use - Rinvoq 15 mg 1 tablet by mouth once daily, Methotrexate 0.8 ml sq injections once weekly, and folic acid 2 mg daily.  CBC and CMP were within normal limits on 10/22/2019.  PPD performed last week for work.  She will fax the results to our office. - Plan: CBC with Differential/Platelet, COMPLETE METABOLIC PANEL WITH GFR She has not had any recent infections.  She has had both covid-19 vaccine doses and plans on receiving the booster.  She was advised to hold MTX and rinvoq for 1 week after the booster dose.  She was also advised to avoid taking tylenol and NSAIDs 24 hours prior to the booster.  She was advised to notify us or PCP if she develops the covid-19 infection in order to receive the monoclonal antibody infusion.  She voiced understanding. She is aware that she should hold MTX and rinvoq if she develops signs or symptoms of an infection and to resume once the infection has closely cleared.   Other medical conditions are listed as follows:   History of hypothyroidism  Elevated LDL cholesterol level  Anxiety and depression  Former smoker  Orders: Orders Placed This Encounter  Procedures  . CBC with Differential/Platelet  . COMPLETE METABOLIC PANEL WITH GFR   Meds ordered this encounter  Medications  . folic acid (FOLVITE) 1 MG tablet    Sig: Take 2 tablets (2 mg total) by mouth daily.    Dispense:  180 tablet    Refill:  3    Follow-Up Instructions: Return in about 5 months (around 07/04/2020) for Rheumatoid arthritis.   Gearldine Bienenstock, PA-C  Note - This record has been created using Dragon software.  Chart creation errors have been sought, but may not  always  have been located. Such creation errors do not reflect on  the standard of medical care.

## 2020-02-01 NOTE — Telephone Encounter (Signed)
Last Visit: 10/22/2019 Next Visit: 02/04/2020 Labs: 10/22/2019 CBC and CMP WNL  Current Dose per office note 10/22/2019: Rinvoq 15 mg daily DX: Rheumatoid arthritis involving multiple sites with positive rheumatoid factor   Patient to update labs at office visit on 02/04/2020  Okay to refill Rinvoq?

## 2020-02-04 ENCOUNTER — Ambulatory Visit (INDEPENDENT_AMBULATORY_CARE_PROVIDER_SITE_OTHER): Payer: 59 | Admitting: Physician Assistant

## 2020-02-04 ENCOUNTER — Other Ambulatory Visit: Payer: Self-pay

## 2020-02-04 ENCOUNTER — Encounter: Payer: Self-pay | Admitting: Physician Assistant

## 2020-02-04 VITALS — BP 126/84 | HR 94 | Resp 16 | Ht 67.0 in | Wt 280.0 lb

## 2020-02-04 DIAGNOSIS — Z79899 Other long term (current) drug therapy: Secondary | ICD-10-CM

## 2020-02-04 DIAGNOSIS — Z8639 Personal history of other endocrine, nutritional and metabolic disease: Secondary | ICD-10-CM

## 2020-02-04 DIAGNOSIS — F419 Anxiety disorder, unspecified: Secondary | ICD-10-CM | POA: Diagnosis not present

## 2020-02-04 DIAGNOSIS — Z87891 Personal history of nicotine dependence: Secondary | ICD-10-CM

## 2020-02-04 DIAGNOSIS — E7800 Pure hypercholesterolemia, unspecified: Secondary | ICD-10-CM | POA: Diagnosis not present

## 2020-02-04 DIAGNOSIS — M0579 Rheumatoid arthritis with rheumatoid factor of multiple sites without organ or systems involvement: Secondary | ICD-10-CM | POA: Diagnosis not present

## 2020-02-04 DIAGNOSIS — F32A Depression, unspecified: Secondary | ICD-10-CM | POA: Diagnosis not present

## 2020-02-04 MED ORDER — METHOTREXATE SODIUM CHEMO INJECTION 50 MG/2ML: INTRAMUSCULAR | 0 refills | Status: DC

## 2020-02-04 MED ORDER — "TUBERCULIN-ALLERGY SYRINGES 27G X 1/2"" 1 ML MISC": 2 refills | Status: DC

## 2020-02-04 MED ORDER — FOLIC ACID 1 MG PO TABS
2.0000 mg | ORAL_TABLET | Freq: Every day | ORAL | 3 refills | Status: DC
Start: 2020-02-04 — End: 2021-01-27

## 2020-02-04 NOTE — Progress Notes (Unsigned)
Vial and syringe methotrexate. Please review and send prescriptions to [facility]. Thanks!

## 2020-02-04 NOTE — Patient Instructions (Addendum)
Standing Labs We placed an order today for your standing lab work.   Please have your standing labs drawn in February and every 3 months   If possible, please have your labs drawn 2 weeks prior to your appointment so that the provider can discuss your results at your appointment.  We have open lab daily Monday through Thursday from 8:30-12:30 PM and 1:30-4:30 PM and Friday from 8:30-12:30 PM and 1:30-4:00 PM at the office of Dr. Shaili Deveshwar, [facility].   Please be advised, patients with office appointments requiring lab work will take precedents over walk-in lab work.  If possible, please come for your lab work on Monday and Friday afternoons, as you may experience shorter wait times. The office is located at [address], [address], [place], [place] [address] No appointment is necessary.   Labs are drawn by [facility]. Please bring your co-pay at the time of your lab draw.  You may receive a bill from [facility] for your lab work.  If you wish to have your labs drawn at another location, please call the office 24 hours in advance to send orders.  If you have any questions regarding directions or hours of operation,  please call [phone].   As a reminder, please drink plenty of water prior to coming for your lab work. Thanks!  COVID-19 vaccine recommendations:   COVID-19 vaccine is recommended for everyone (unless you are allergic to a vaccine component), even if you are on a medication that suppresses your immune system.   If you are on Methotrexate, Cellcept (mycophenolate), Rinvoq, Xeljanz, and Olumiant- hold the medication for 1 week after each vaccine. Hold Methotrexate for 2 weeks after the single dose COVID-19 vaccine.   If you are on Orencia subcutaneous injection - hold medication one week prior to and one week after the first COVID-19 vaccine dose (only).   If you are on Orencia IV infusions- time vaccination administration so that the first COVID-19  vaccination will occur four weeks after the infusion and postpone the subsequent infusion by one week.   If you are on Cyclophosphamide or Rituxan infusions please contact your doctor prior to receiving the COVID-19 vaccine.   Do not take Tylenol or any anti-inflammatory medications (NSAIDs) 24 hours prior to the COVID-19 vaccination.   There is no direct evidence about the efficacy of the COVID-19 vaccine in individuals who are on medications that suppress the immune system.   Even if you are fully vaccinated, and you are on any medications that suppress your immune system, please continue to wear a mask, maintain at least six feet social distance and practice hand hygiene.   If you develop a COVID-19 infection, please contact your PCP or our office to determine if you need monoclonal antibody infusion.  The booster vaccine is now available for immunocompromised patients.   Please see the following web sites for updated information.   [email or web address]     

## 2020-02-05 ENCOUNTER — Telehealth: Payer: Self-pay | Admitting: *Deleted

## 2020-02-05 ENCOUNTER — Telehealth: Payer: Self-pay

## 2020-02-05 LAB — CBC WITH DIFFERENTIAL/PLATELET
Absolute Monocytes: 317 cells/uL (ref 200–950)
Basophils Absolute: 20 cells/uL (ref 0–200)
Basophils Relative: 0.3 %
Eosinophils Absolute: 92 cells/uL (ref 15–500)
Eosinophils Relative: 1.4 %
HCT: 37.1 % (ref 35.0–45.0)
Hemoglobin: 12.2 g/dL (ref 11.7–15.5)
Lymphs Abs: 1729 cells/uL (ref 850–3900)
MCH: 27.9 pg (ref 27.0–33.0)
MCHC: 32.9 g/dL (ref 32.0–36.0)
MCV: 84.9 fL (ref 80.0–100.0)
MPV: 9.6 fL (ref 7.5–12.5)
Monocytes Relative: 4.8 %
Neutro Abs: 4442 cells/uL (ref 1500–7800)
Neutrophils Relative %: 67.3 %
Platelets: 343 10*3/uL (ref 140–400)
RBC: 4.37 10*6/uL (ref 3.80–5.10)
RDW: 14.6 % (ref 11.0–15.0)
Total Lymphocyte: 26.2 %
WBC: 6.6 10*3/uL (ref 3.8–10.8)

## 2020-02-05 LAB — COMPLETE METABOLIC PANEL WITH GFR
AG Ratio: 1.6 (calc) (ref 1.0–2.5)
ALT: 21 U/L (ref 6–29)
AST: 15 U/L (ref 10–30)
Albumin: 4.1 g/dL (ref 3.6–5.1)
Alkaline phosphatase (APISO): 90 U/L (ref 31–125)
BUN: 16 mg/dL (ref 7–25)
CO2: 26 mmol/L (ref 20–32)
Calcium: 8.6 mg/dL (ref 8.6–10.2)
Chloride: 105 mmol/L (ref 98–110)
Creat: 0.53 mg/dL (ref 0.50–1.10)
GFR, Est African American: 145 mL/min/{1.73_m2} (ref >=60)
GFR, Est Non African American: 125 mL/min/{1.73_m2} (ref >=60)
Globulin: 2.6 g/dL (calc) (ref 1.9–3.7)
Glucose, Bld: 84 mg/dL (ref 65–99)
Potassium: 4.3 mmol/L (ref 3.5–5.3)
Sodium: 138 mmol/L (ref 135–146)
Total Bilirubin: 0.3 mg/dL (ref 0.2–1.2)
Total Protein: 6.7 g/dL (ref 6.1–8.1)

## 2020-02-05 NOTE — Telephone Encounter (Signed)
-----   Message from Gearldine Bienenstock, PA-C sent at 02/05/2020  8:07 AM EDT ----- CBC and CMP WNL.

## 2020-02-05 NOTE — Telephone Encounter (Signed)
Patient advised of lab results

## 2020-02-05 NOTE — Telephone Encounter (Signed)
Patient called stating she did call [facility] this afternoon and was told they didn't receive any prescriptions from Dr. Fatima Sanger office.  Patient is requesting prescription refills of Methotrexate, Folic Acid, and the Syringes.

## 2020-02-05 NOTE — Progress Notes (Signed)
CBC and CMP WNL

## 2020-02-06 MED ORDER — METHOTREXATE SODIUM CHEMO INJECTION 50 MG/2ML: INTRAMUSCULAR | 0 refills | Status: DC

## 2020-02-06 MED ORDER — TUBERCULIN-ALLERGY SYRINGES 27G X 1/2" 1 ML MISC
2 refills | Status: DC
Start: 2020-02-06 — End: 2023-07-27

## 2020-02-06 NOTE — Telephone Encounter (Signed)
Spoke with pharmacy and they received the Folic Acid. They did not receive the prescription for the MTX and syringes. Patient advised prescription for the MTX and syringes were resent. Patient expressed understanding.

## 2020-04-08 ENCOUNTER — Telehealth: Payer: Self-pay | Admitting: *Deleted

## 2020-04-08 NOTE — Telephone Encounter (Signed)
Received fax from [facility] stating they have been trying to reach her to refill Rinvoq. Patient advised they have made several attempted with no success. Patient advised to contact them at [phone].

## 2020-04-09 ENCOUNTER — Other Ambulatory Visit: Payer: Self-pay | Admitting: Physician Assistant

## 2020-05-22 ENCOUNTER — Other Ambulatory Visit: Payer: Self-pay | Admitting: Rheumatology

## 2020-05-22 DIAGNOSIS — M0579 Rheumatoid arthritis with rheumatoid factor of multiple sites without organ or systems involvement: Secondary | ICD-10-CM

## 2020-05-22 NOTE — Telephone Encounter (Addendum)
Last Visit: 02/04/2020 Next Visit: 07/07/2020 Labs: 02/04/2020, CBC and CMP WNL LMOM patient advised labs are due TB Gold: PPD performed last week for work, need copy of PPD  Current Dose per office note 02/04/2020, Rinvoq 15 mg 1 tablet by mouth once daily,  DX: Rheumatoid arthritis involving multiple sites with positive rheumatoid factor   Last Fill: 02/01/2020  Okay to refill Rinvoq?

## 2020-05-23 NOTE — Telephone Encounter (Signed)
Please advise patient to come in for labs this month.

## 2020-05-26 NOTE — Telephone Encounter (Signed)
Patient states she is not pregnant or breastfeeding. Patient has had a tubal ligation. Patient states she will come in for labs this week.

## 2020-06-16 ENCOUNTER — Other Ambulatory Visit: Payer: Self-pay | Admitting: *Deleted

## 2020-06-16 ENCOUNTER — Telehealth: Payer: Self-pay | Admitting: *Deleted

## 2020-06-16 DIAGNOSIS — E7800 Pure hypercholesterolemia, unspecified: Secondary | ICD-10-CM

## 2020-06-16 DIAGNOSIS — M0579 Rheumatoid arthritis with rheumatoid factor of multiple sites without organ or systems involvement: Secondary | ICD-10-CM

## 2020-06-16 DIAGNOSIS — Z79899 Other long term (current) drug therapy: Secondary | ICD-10-CM

## 2020-06-17 ENCOUNTER — Other Ambulatory Visit: Payer: Self-pay | Admitting: *Deleted

## 2020-06-17 ENCOUNTER — Telehealth: Payer: Self-pay | Admitting: *Deleted

## 2020-06-17 DIAGNOSIS — M0579 Rheumatoid arthritis with rheumatoid factor of multiple sites without organ or systems involvement: Secondary | ICD-10-CM

## 2020-06-17 LAB — CBC WITH DIFFERENTIAL/PLATELET
Absolute Monocytes: 422 cells/uL (ref 200–950)
Basophils Absolute: 20 cells/uL (ref 0–200)
Basophils Relative: 0.3 %
Eosinophils Absolute: 60 cells/uL (ref 15–500)
Eosinophils Relative: 0.9 %
HCT: 36.8 % (ref 35.0–45.0)
Hemoglobin: 12.2 g/dL (ref 11.7–15.5)
Lymphs Abs: 2003 cells/uL (ref 850–3900)
MCH: 29 pg (ref 27.0–33.0)
MCHC: 33.2 g/dL (ref 32.0–36.0)
MCV: 87.4 fL (ref 80.0–100.0)
MPV: 9.9 fL (ref 7.5–12.5)
Monocytes Relative: 6.3 %
Neutro Abs: 4194 cells/uL (ref 1500–7800)
Neutrophils Relative %: 62.6 %
Platelets: 320 10*3/uL (ref 140–400)
RBC: 4.21 10*6/uL (ref 3.80–5.10)
RDW: 13.9 % (ref 11.0–15.0)
Total Lymphocyte: 29.9 %
WBC: 6.7 10*3/uL (ref 3.8–10.8)

## 2020-06-17 LAB — LIPID PANEL
Cholesterol: 212 mg/dL — ABNORMAL HIGH (ref <200)
HDL: 27 mg/dL — ABNORMAL LOW (ref >=50)
LDL Cholesterol (Calc): 151 mg/dL (calc) — ABNORMAL HIGH
Non-HDL Cholesterol (Calc): 185 mg/dL (calc) — ABNORMAL HIGH (ref <130)
Total CHOL/HDL Ratio: 7.9 (calc) — ABNORMAL HIGH (ref <5.0)
Triglycerides: 200 mg/dL — ABNORMAL HIGH (ref <150)

## 2020-06-17 LAB — COMPLETE METABOLIC PANEL WITH GFR
AG Ratio: 1.6 (calc) (ref 1.0–2.5)
ALT: 18 U/L (ref 6–29)
AST: 18 U/L (ref 10–30)
Albumin: 4.3 g/dL (ref 3.6–5.1)
Alkaline phosphatase (APISO): 82 U/L (ref 31–125)
BUN: 19 mg/dL (ref 7–25)
CO2: 26 mmol/L (ref 20–32)
Calcium: 9 mg/dL (ref 8.6–10.2)
Chloride: 106 mmol/L (ref 98–110)
Creat: 0.7 mg/dL (ref 0.50–1.10)
GFR, Est African American: 132 mL/min/{1.73_m2} (ref >=60)
GFR, Est Non African American: 114 mL/min/{1.73_m2} (ref >=60)
Globulin: 2.7 g/dL (calc) (ref 1.9–3.7)
Glucose, Bld: 90 mg/dL (ref 65–99)
Potassium: 4.4 mmol/L (ref 3.5–5.3)
Sodium: 140 mmol/L (ref 135–146)
Total Bilirubin: 0.3 mg/dL (ref 0.2–1.2)
Total Protein: 7 g/dL (ref 6.1–8.1)

## 2020-06-17 MED ORDER — RINVOQ 15 MG PO TB24
1.0000 | ORAL_TABLET | Freq: Every day | ORAL | 0 refills | Status: DC
Start: 2020-06-17 — End: 2020-10-29

## 2020-06-17 MED ORDER — METHOTREXATE SODIUM CHEMO INJECTION 50 MG/2ML: INTRAMUSCULAR | 0 refills | Status: DC

## 2020-06-17 NOTE — Progress Notes (Signed)
HDL is low-27.  TG elevated 200.  LDL is elevated-151.  Please notify the patient and forward lab work to PCP.  CBC and CMP WNL.

## 2020-06-17 NOTE — Telephone Encounter (Signed)
PPD skin test: 02/01/2020 negative.   Prior therapy: Enbrel, Cimzia, Remicade, Plaquenil, and MTX monotherapy

## 2020-06-17 NOTE — Telephone Encounter (Signed)
error 

## 2020-06-17 NOTE — Telephone Encounter (Addendum)
Next Visit: 07/07/2020  Last Visit: 02/04/2020  Last Fill:  Rinvoq 05/26/2020, MTX 02/06/2020  DX: Rheumatoid arthritis involving multiple sites with positive rheumatoid factor   Current Dose per office note 02/04/2020, Rinvoq 15 mg 1 tablet by mouth once daily, Methotrexate 0.8 ml sq injections once weekly  Labs: 06/16/2020, HDL is low-27. TG elevated 200. LDL is elevated-151. Please notify the patient and forward lab work to PCP. CBC and CMP WNL.   PPD skin test: 02/01/2020 negative.   Okay to refill MTX and Rinvoq?

## 2020-06-18 ENCOUNTER — Encounter: Payer: Self-pay | Admitting: Rheumatology

## 2020-06-18 MED ORDER — METHOTREXATE SODIUM CHEMO INJECTION 50 MG/2ML: INTRAMUSCULAR | 0 refills | Status: DC

## 2020-06-18 NOTE — Telephone Encounter (Signed)
Patient returned call to clinic stating [facility] has MTX vial in stock. States they are unable to run prescription because it is filled elsewhere. Advised her to reach out to [facility] to cancel MTX prescription so [facility] could adjudicate. Pt verbalized understanding and will plan to do so. Nothing further needed.

## 2020-06-23 NOTE — Progress Notes (Deleted)
Office Visit Note  Patient: Lori Valentine             Date of Birth: January 23, 1986           MRN: [id number]             PCP: Leeroy Bock, DO Referring: Leeroy Bock, DO Visit Date: 07/07/2020 Occupation: @GUAROCC @  Subjective:  No chief complaint on file.   History of Present Illness: Lori Valentine is a 35 y.o. female ***   Activities of Daily Living:  Patient reports morning stiffness for *** {minute/hour:19697}.   Patient {ACTIONS;DENIES/REPORTS:21021675::"Denies"} nocturnal pain.  Difficulty dressing/grooming: {ACTIONS;DENIES/REPORTS:21021675::"Denies"} Difficulty climbing stairs: {ACTIONS;DENIES/REPORTS:21021675::"Denies"} Difficulty getting out of chair: {ACTIONS;DENIES/REPORTS:21021675::"Denies"} Difficulty using hands for taps, buttons, cutlery, and/or writing: {ACTIONS;DENIES/REPORTS:21021675::"Denies"}  No Rheumatology ROS completed.   PMFS History:  Patient Active Problem List   Diagnosis Date Noted  . Difficulty concentrating 02/03/2018  . Anemia 09/08/2017  . Normal labor 08/04/2017  . Severe obesity (BMI >= 40) (HCC) 07/28/2017  . Obesity in pregnancy, antepartum 03/11/2017  . Maternal rheumatoid arthritis complicating pregnancy (HCC) 03/11/2017  . Supervision of high-risk pregnancy 02/08/2017  . 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 History of anxiety and depression 10/22/2016  . Smoker 10/22/2016  . Rheumatoid arthritis (HCC) 05/13/2015  . Hypothyroidism 07/29/2014    Past Medical History:  Diagnosis Date  . Hypothyroid   . Rheumatoid arthritis (HCC)   . 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 Schizophrenia Mother   . Bipolar disorder Mother   . Diabetes Other   . Heart disease Other   . Diabetes Sister   . Healthy Son    Past Surgical History:  Procedure Laterality Date  . CESAREAN SECTION N/A 08/04/2017   Procedure: CESAREAN SECTION;  Surgeon: 10/04/2017, MD;  Location: [facility] BIRTHING SUITES;  Service: Obstetrics;  Laterality: N/A;  . NO PAST SURGERIES    . TUBAL LIGATION Bilateral 08/04/2017   Procedure: BILATERAL TUBAL LIGATION;  Surgeon: 10/04/2017, MD;  Location: [facility] BIRTHING SUITES;  Service: Obstetrics;  Laterality: Bilateral;   Social History   Social History Narrative   88 year old son   Lives with  Sister and nephew and son.   Works as 10 at [employer] History  Administered Date(s) Administered  . Influenza,inj,Quad PF,6+ Mos 05/13/2015, 12/29/2016  . Influenza-Unspecified 01/12/2018  . Moderna Sars-Covid-2 Vaccination 05/28/2019, 07/03/2019  . Tdap 05/13/2015, 06/15/2017     Objective: Vital Signs: There were no vitals taken for this visit.   Physical Exam   Musculoskeletal Exam: ***  CDAI Exam: CDAI Score: -- Patient Global: --; Provider Global: -- Swollen: --; Tender: -- Joint Exam 07/07/2020   No joint exam has been documented for this visit   There is currently no information documented on the homunculus. Go to the Rheumatology activity  and complete the homunculus joint exam.  Investigation: No additional findings.  Imaging: No results found.  Recent Labs: Lab Results  Component Value Date   WBC 6.7 06/16/2020   HGB 12.2 06/16/2020   PLT 320 06/16/2020   NA 140 06/16/2020   K 4.4 06/16/2020   CL 106 06/16/2020   CO2 26 06/16/2020   GLUCOSE 90 06/16/2020   BUN 19 06/16/2020   CREATININE 0.70 06/16/2020   BILITOT 0.3 06/16/2020   ALKPHOS 253 (H) 08/04/2017    AST 18 06/16/2020   ALT 18 06/16/2020   PROT 7.0 06/16/2020   ALBUMIN 2.5 (L) 08/04/2017   CALCIUM 9.0 06/16/2020   GFRAA 132 06/16/2020   QFTBGOLD Negative 07/07/2016   QFTBGOLDPLUS NEGATIVE 11/28/2018    Speciality Comments: PPD skin test: 02/01/2020 negative.   Prior therapy: Enbrel, Cimzia, Remicade, Plaquenil, and MTX monotherapy  Procedures:  No procedures performed Allergies: Effexor [venlafaxine]   Assessment / Plan:     Visit Diagnoses: Rheumatoid arthritis involving multiple sites with positive rheumatoid factor (HCC)  High risk medication use  Elevated LDL cholesterol level  History of hypothyroidism  Anxiety and depression  Former smoker  Orders: No orders of the defined types were placed in this encounter.  No orders of the defined types were placed in this encounter.   Face-to-face time spent with patient was *** minutes. Greater than 50% of time was spent in counseling and coordination of care.  Follow-Up Instructions: No follow-ups on file.   Gearldine Bienenstock, PA-C  Note - This record has been created using Dragon software.  Chart creation errors have been sought, but may not always  have been located. Such creation errors do not reflect on  the standard of medical care.

## 2020-07-07 ENCOUNTER — Ambulatory Visit: Payer: 59 | Admitting: Physician Assistant

## 2020-07-07 DIAGNOSIS — F32A Depression, unspecified: Secondary | ICD-10-CM

## 2020-07-07 DIAGNOSIS — M0579 Rheumatoid arthritis with rheumatoid factor of multiple sites without organ or systems involvement: Secondary | ICD-10-CM

## 2020-07-07 DIAGNOSIS — Z8639 Personal history of other endocrine, nutritional and metabolic disease: Secondary | ICD-10-CM

## 2020-07-07 DIAGNOSIS — Z79899 Other long term (current) drug therapy: Secondary | ICD-10-CM

## 2020-07-07 DIAGNOSIS — E7800 Pure hypercholesterolemia, unspecified: Secondary | ICD-10-CM

## 2020-07-07 DIAGNOSIS — Z87891 Personal history of nicotine dependence: Secondary | ICD-10-CM

## 2020-07-11 NOTE — Progress Notes (Deleted)
Office Visit Note  Patient: Lori Valentine             Date of Birth: 1985-08-16           MRN: [id number]             PCP: Leeroy Bock, DO Referring: Leeroy Bock, DO Visit Date: 07/25/2020 Occupation: @GUAROCC @  Subjective:    History of Present Illness: Twala Collings is a 35 y.o. female with history of seropositive rheumatoid arthritis.  She is taking rinvoq 15 mg 1 tablet by mouth daily, MTX 0.8 ml sq injections once weekly, and folic acid 2 mg by mouth daily.   CBC and CMP updated on 06/16/20.  Lipid panel drawn on 06/16/20.  TB gold?  Activities of Daily Living:  Patient reports morning stiffness for *** {minute/hour:19697}.   Patient {ACTIONS;DENIES/REPORTS:21021675::"Denies"} nocturnal pain.  Difficulty dressing/grooming: {ACTIONS;DENIES/REPORTS:21021675::"Denies"} Difficulty climbing stairs: {ACTIONS;DENIES/REPORTS:21021675::"Denies"} Difficulty getting out of chair: {ACTIONS;DENIES/REPORTS:21021675::"Denies"} Difficulty using hands for taps, buttons, cutlery, and/or writing: {ACTIONS;DENIES/REPORTS:21021675::"Denies"}  No Rheumatology ROS completed.   PMFS History:  Patient Active Problem List   Diagnosis Date Noted  . Difficulty concentrating 02/03/2018  . Anemia 09/08/2017  . Normal labor 08/04/2017  . Severe obesity (BMI >= 40) (HCC) 07/28/2017  . Obesity in pregnancy, antepartum 03/11/2017  . Maternal rheumatoid arthritis complicating pregnancy (HCC) 03/11/2017  . Supervision of high-risk pregnancy 02/08/2017  . 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 History of anxiety and depression 10/22/2016  . Smoker 10/22/2016  . Rheumatoid arthritis (HCC) 05/13/2015  . Hypothyroidism 07/29/2014    Past Medical History:  Diagnosis Date  . Hypothyroid   . Rheumatoid arthritis (HCC)   . 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 Schizophrenia Mother   . Bipolar disorder Mother   . Diabetes Other   . Heart disease Other   . Diabetes Sister   . Healthy Son    Past Surgical History:  Procedure Laterality Date  . CESAREAN SECTION N/A 08/04/2017   Procedure: CESAREAN SECTION;  Surgeon: 10/04/2017, MD;  Location: [facility] BIRTHING SUITES;  Service: Obstetrics;  Laterality: N/A;  . NO PAST SURGERIES    . TUBAL LIGATION Bilateral 08/04/2017   Procedure: BILATERAL TUBAL LIGATION;  Surgeon: 10/04/2017, MD;  Location: [facility] BIRTHING SUITES;  Service: Obstetrics;  Laterality: Bilateral;   Social History   Social History Narrative   57 year old son   Lives with  Sister and nephew and son.   Works as 10 at [employer] History  Administered Date(s) Administered  . Influenza,inj,Quad PF,6+ Mos 05/13/2015, 12/29/2016  . Influenza-Unspecified 01/12/2018  . Moderna Sars-Covid-2 Vaccination 05/28/2019, 07/03/2019  . Tdap 05/13/2015, 06/15/2017     Objective: Vital Signs: There were no vitals taken for this visit.   Physical Exam Vitals and nursing note reviewed.  Constitutional:  Appearance: She is well-developed.  HENT:     Head: Normocephalic and atraumatic.  Eyes:     Conjunctiva/sclera: Conjunctivae normal.  Pulmonary:     Effort: Pulmonary effort is normal.  Abdominal:     Palpations: Abdomen is soft.  Musculoskeletal:     Cervical back: Normal range of motion.  Skin:    General: Skin is warm and dry.     Capillary Refill: Capillary refill takes less than 2 seconds.  Neurological:     Mental Status: She is alert and oriented  to person, place, and time.  Psychiatric:        Behavior: Behavior normal.      Musculoskeletal Exam: ***  CDAI Exam: CDAI Score: -- Patient Global: --; Provider Global: -- Swollen: --; Tender: -- Joint Exam 07/25/2020   No joint exam has been documented for this visit   There is currently no information documented on the homunculus. Go to the Rheumatology activity and complete the homunculus joint exam.  Investigation: No additional findings.  Imaging: No results found.  Recent Labs: Lab Results  Component Value Date   WBC 6.7 06/16/2020   HGB 12.2 06/16/2020   PLT 320 06/16/2020   NA 140 06/16/2020   K 4.4 06/16/2020   CL 106 06/16/2020   CO2 26 06/16/2020   GLUCOSE 90 06/16/2020   BUN 19 06/16/2020   CREATININE 0.70 06/16/2020   BILITOT 0.3 06/16/2020   ALKPHOS 253 (H) 08/04/2017   AST 18 06/16/2020   ALT 18 06/16/2020   PROT 7.0 06/16/2020   ALBUMIN 2.5 (L) 08/04/2017   CALCIUM 9.0 06/16/2020   GFRAA 132 06/16/2020   QFTBGOLD Negative 07/07/2016   QFTBGOLDPLUS NEGATIVE 11/28/2018    Speciality Comments: PPD skin test: 02/01/2020 negative.   Prior therapy: Enbrel, Cimzia, Remicade, Plaquenil, and MTX monotherapy  Procedures:  No procedures performed Allergies: Effexor [venlafaxine]   Assessment / Plan:     Visit Diagnoses: Rheumatoid arthritis involving multiple sites with positive rheumatoid factor (HCC)  High risk medication use - Rinvoq 15 mg 1 tablet by mouth once daily, Methotrexate 0.8 ml sq injections once weekly, and folic acid 2 mg daily.   Elevated LDL cholesterol level  History of hypothyroidism  Anxiety and depression  Former smoker  Orders: No orders of the defined types were placed in this encounter.  No orders of the defined types were placed in this encounter.    Follow-Up Instructions: No follow-ups on file.   Gearldine Bienenstock, PA-C  Note - This record has been created using Dragon software.  Chart creation errors  have been sought, but may not always  have been located. Such creation errors do not reflect on  the standard of medical care.

## 2020-07-25 ENCOUNTER — Ambulatory Visit: Payer: 59 | Admitting: Physician Assistant

## 2020-07-25 DIAGNOSIS — Z79899 Other long term (current) drug therapy: Secondary | ICD-10-CM

## 2020-07-25 DIAGNOSIS — F32A Depression, unspecified: Secondary | ICD-10-CM

## 2020-07-25 DIAGNOSIS — Z87891 Personal history of nicotine dependence: Secondary | ICD-10-CM

## 2020-07-25 DIAGNOSIS — Z8639 Personal history of other endocrine, nutritional and metabolic disease: Secondary | ICD-10-CM

## 2020-07-25 DIAGNOSIS — M0579 Rheumatoid arthritis with rheumatoid factor of multiple sites without organ or systems involvement: Secondary | ICD-10-CM

## 2020-07-25 DIAGNOSIS — E7800 Pure hypercholesterolemia, unspecified: Secondary | ICD-10-CM

## 2020-08-19 ENCOUNTER — Other Ambulatory Visit: Payer: Self-pay | Admitting: Rheumatology

## 2020-08-19 NOTE — Telephone Encounter (Signed)
Next Visit: message sent to front desk to schedule appt, Return in about 5 months (around 07/04/2020) for Rheumatoid arthritis.  Last Visit: 02/04/2020  Last Fill: 06/18/2020  DX: Rheumatoid arthritis involving multiple sites with positive rheumatoid factor   Current Dose per office note 02/04/2020, Methotrexate 0.8 ml sq injections once weekly  Labs: 06/16/2020, HDL is low-27. TG elevated 200. LDL is elevated-151. Please notify the patient and forward lab work to PCP. CBC and CMP WNL  Okay to refill MTX?

## 2020-08-19 NOTE — Telephone Encounter (Signed)
Follow up scheduled for 08/29/2020.

## 2020-08-19 NOTE — Telephone Encounter (Signed)
Please call patient to schedule appt, thank you.   Return in about 5 months (around 07/04/2020) for Rheumatoid arthritis.

## 2020-08-28 NOTE — Progress Notes (Signed)
Office Visit Note  Patient: Lori Valentine             Date of Birth: 02-07-1986           MRN: [id number]             PCP: Leeroy Bock, DO Referring: Leeroy Bock, DO Visit Date: 08/29/2020 Occupation: @GUAROCC @  Subjective:  Medication monitoring   History of Present Illness: Lori Valentine is a 35 y.o. female with history of seropositive rheumatoid arthritis.  She is on methotrexate 0.8 ml sq injections once weekly, folic acid 2 mg daily, and rinvoq 15 mg 1 tablet by mouth daily.  She feels this combination has been effective at managing her rheumatoid arthritis.  She states that she recently had a gastrointestinal infection followed by an upper respiratory tract infection.  She states that she held methotrexate for 1 month during that time.  She states that she held Enbrel for several days during the gastrointestinal infection due to being unable to keep anything down.  Patient reports that she has restarted both medications and her joint pain and inflammation have gradually started to improve.  She experienced nausea with the last 2 methotrexate injections. She states she feels anxious and nauseated before receiving the injection from a coworker and several hours after the injection.  She has not tried taking anything for the nausea. She continues to have morning stiffness lasting 45 minutes daily.  She has occasional discomfort in both knee joints but denies any joint swelling currently.   Activities of Daily Living:  Patient reports morning stiffness for 45 minutes.   Patient Reports nocturnal pain.  Difficulty dressing/grooming: Denies Difficulty climbing stairs: Reports Difficulty getting out of chair: Denies Difficulty using hands for taps, buttons, cutlery, and/or writing: Reports  Review of Systems  Constitutional: Positive for fatigue.  HENT: Negative for mouth dryness.   Eyes: Negative for dryness.  Respiratory: Negative for shortness of breath.    Cardiovascular: Negative for swelling in legs/feet.  Gastrointestinal: Negative for constipation.  Endocrine: Negative for excessive thirst.  Genitourinary: Negative for difficulty urinating.  Musculoskeletal: Positive for arthralgias, joint pain, joint swelling, morning stiffness and muscle tenderness.  Skin: Negative for rash.  Allergic/Immunologic: Negative for susceptible to infections.  Neurological: Negative for numbness.  Hematological: Negative for bruising/bleeding tendency.  Psychiatric/Behavioral: Positive for sleep disturbance.    PMFS History:  Patient Active Problem List   Diagnosis Date Noted  . Difficulty concentrating 02/03/2018  . Anemia 09/08/2017  . Normal labor 08/04/2017  . Severe obesity (BMI >= 40) (HCC) 07/28/2017  . Obesity in pregnancy, antepartum 03/11/2017  . Maternal rheumatoid arthritis complicating pregnancy (HCC) 03/11/2017  . Supervision of high-risk pregnancy 02/08/2017  . 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 History of anxiety and depression 10/22/2016  . Smoker 10/22/2016  . Rheumatoid arthritis (HCC) 05/13/2015  . Hypothyroidism 07/29/2014    Past Medical History:  Diagnosis Date  . Hypothyroid   . Rheumatoid arthritis (HCC)   . 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 Schizophrenia Mother   . Bipolar disorder Mother   . Diabetes Other   . Heart disease Other   . Diabetes Sister   . Healthy Son    Past Surgical History:  Procedure Laterality Date  . CESAREAN  SECTION N/A 08/04/2017   Procedure: CESAREAN SECTION;  Surgeon: Hermina Staggers, MD;  Location: [facility] BIRTHING SUITES;  Service: Obstetrics;  Laterality: N/A;  . NO PAST SURGERIES    . TUBAL LIGATION Bilateral 08/04/2017   Procedure: BILATERAL TUBAL LIGATION;  Surgeon: Hermina Staggers, MD;  Location: [facility] BIRTHING SUITES;  Service: Obstetrics;  Laterality: Bilateral;   Social History   Social History Narrative   67 year old son   Lives with  Sister and nephew and son.   Works as Sales executive at [employer] History  Administered Date(s) Administered  . Influenza,inj,Quad PF,6+ Mos 05/13/2015, 12/29/2016  . Influenza-Unspecified 01/12/2018  . Moderna Sars-Covid-2 Vaccination 05/28/2019, 07/03/2019  . Tdap 05/13/2015, 06/15/2017     Objective: Vital Signs: BP 138/82 (BP Location: Left Arm, Patient Position: Sitting, Cuff Size: Large)   Pulse 91   Resp 16   Ht 5\' 7"  (1.702 m)   Wt 294 lb (133.4 kg)   BMI 46.05 kg/m    Physical Exam Vitals and nursing note reviewed.  Constitutional:      Appearance: She is well-developed.  HENT:     Head: Normocephalic and atraumatic.  Eyes:     Conjunctiva/sclera: Conjunctivae normal.  Pulmonary:     Effort: Pulmonary effort is normal.  Abdominal:     Palpations: Abdomen is soft.  Musculoskeletal:     Cervical back: Normal range of motion.  Skin:    General: Skin is warm and dry.     Capillary Refill: Capillary refill takes less than 2 seconds.  Neurological:     Mental Status: She is alert and oriented to person, place, and time.  Psychiatric:        Behavior: Behavior normal.      Musculoskeletal Exam: C-spine, thoracic spine, and lumbar spine good ROM.  Shoulder joints and elbow joints good ROM with no discomfort.  Wrist joints limited ROM with synovial thickening.  Synovitis over bilateral 2nd and 3rd MCP joints.  No tenderness over PIP or DIP joints.  Complete fist formation bilaterally.  Hip joints good ROM with  no discomfort.  Knee joints good ROM with no warmth or effusion.  Ankle joints good ROM with warmth in the right ankle.   CDAI Exam: CDAI Score: 0.6  Patient Global: 3 mm; Provider Global: 3 mm Swollen: 0 ; Tender: 0  Joint Exam 08/29/2020   No joint exam has been documented for this visit   There is currently no information documented on the homunculus. Go to the Rheumatology activity and complete the homunculus joint exam.  Investigation: No additional findings.  Imaging: No results found.  Recent Labs: Lab Results  Component Value Date   WBC 6.7 06/16/2020   HGB 12.2 06/16/2020   PLT 320 06/16/2020   NA 140 06/16/2020   K 4.4 06/16/2020   CL 106 06/16/2020   CO2 26 06/16/2020   GLUCOSE 90 06/16/2020   BUN 19 06/16/2020   CREATININE 0.70 06/16/2020   BILITOT 0.3 06/16/2020   ALKPHOS 253 (H) 08/04/2017   AST  18 06/16/2020   ALT 18 06/16/2020   PROT 7.0 06/16/2020   ALBUMIN 2.5 (L) 08/04/2017   CALCIUM 9.0 06/16/2020   GFRAA 132 06/16/2020   QFTBGOLD Negative 07/07/2016   QFTBGOLDPLUS NEGATIVE 11/28/2018    Speciality Comments: PPD skin test: 02/01/2020 negative.   Prior therapy: Enbrel, Cimzia, Remicade, Plaquenil, and MTX monotherapy  Procedures:  No procedures performed Allergies: Effexor [venlafaxine]   Assessment / Plan:     Visit Diagnoses: Rheumatoid arthritis involving multiple sites with positive rheumatoid factor (HCC): She has synovitis over bilateral 2nd and 3rd MCP joints.  Limited ROM of both wrist joints but no tenderness or synovitis noted.  She continues to have occasional pain in both knee joints but has good ROM with no warmth or effusion on exam.  She has noticed significant clinical improvement while taking rinvoq 15 mg 1 tablet daily, Methotrexate 0.8 ml sq injections once weekly, and folic acid 2 mg by mouth daily.  She recently missed 4 doses of MTX due to having a gastrointestinal infection followed by a URI. She resumed MTX about 2 weeks  ago, and she has started to gradually notice improvement in her joint pain and stiffness.  Overall she has found injectable MTX to be more effective and better tolerated than oral MTX.  She has experienced nausea and some anxiety right before and after her last 2 MTX doses.  She was given a prescription for zofran 4 mg ODT to try taking for nausea.  She will remain on the current treatment regimen.  She was advised to notify us if she develops increased joint pain or joint swelling.  She will follow up in 5 months.   Discussed the black box warning associated with Jak kinase inhibitors including increased risk for malignancy and MACE.   Discussed the importance of modifying risk factors.   High risk medication use:  Rinvoq 15 mg 1 tablet by mouth daily, Methotrexate 0.8 ml sq injections once weekly, and folic acid 2 mg daily.  CBC and CMP within normal limits on 06/16/2020.  She will be due to update lab work in June and every 3 months to monitor for drug toxicity.  Standing orders for CBC and CMP remain in place. TB skin test performed on 01/30/2020. Discussed the importance of holding Rinvoq and methotrexate if she develops signs or symptoms of an infection and to resume once the infection has completely cleared. She has received 2 moderna covid-19 vaccine doses.  Elevated LDL cholesterol level: LDL was 151 on 06/16/20.  TG 200, HDL 27, and cholesterol 212.   Association of heart disease with rheumatoid arthritis was discussed. Need to monitor blood pressure, cholesterol, and to exercise 30-60 minutes on daily basis was discussed.   Other medical conditions are listed as follows:   History of hypothyroidism  Anxiety and depression  Former smoker    Orders: No orders of the defined types were placed in this encounter.  Meds ordered this encounter  Medications  . ondansetron (ZOFRAN-ODT) 4 MG disintegrating tablet    Sig: Take 1 tablet (4 mg total) by mouth every 8 (eight) hours as needed  for nausea or vomiting.    Dispense:  20 tablet    Refill:  0     Follow-Up Instructions: Return in about 5 months (around 01/29/2021) for Rheumatoid arthritis.   Gearldine Bienenstock, PA-C  Note - This record has been created using Dragon software.  Chart creation errors have been sought, but may not always  have  been located. Such creation errors do not reflect on  the standard of medical care.

## 2020-08-29 ENCOUNTER — Other Ambulatory Visit: Payer: Self-pay

## 2020-08-29 ENCOUNTER — Encounter: Payer: Self-pay | Admitting: Physician Assistant

## 2020-08-29 ENCOUNTER — Ambulatory Visit (INDEPENDENT_AMBULATORY_CARE_PROVIDER_SITE_OTHER): Payer: 59 | Admitting: Physician Assistant

## 2020-08-29 VITALS — BP 138/82 | HR 91 | Resp 16 | Ht 67.0 in | Wt 294.0 lb

## 2020-08-29 DIAGNOSIS — E7800 Pure hypercholesterolemia, unspecified: Secondary | ICD-10-CM

## 2020-08-29 DIAGNOSIS — F32A Depression, unspecified: Secondary | ICD-10-CM

## 2020-08-29 DIAGNOSIS — Z8639 Personal history of other endocrine, nutritional and metabolic disease: Secondary | ICD-10-CM | POA: Diagnosis not present

## 2020-08-29 DIAGNOSIS — M0579 Rheumatoid arthritis with rheumatoid factor of multiple sites without organ or systems involvement: Secondary | ICD-10-CM | POA: Diagnosis not present

## 2020-08-29 DIAGNOSIS — Z87891 Personal history of nicotine dependence: Secondary | ICD-10-CM

## 2020-08-29 DIAGNOSIS — Z79899 Other long term (current) drug therapy: Secondary | ICD-10-CM

## 2020-08-29 DIAGNOSIS — F419 Anxiety disorder, unspecified: Secondary | ICD-10-CM

## 2020-08-29 MED ORDER — ONDANSETRON 4 MG PO TBDP: 4.0000 mg | ORAL_TABLET | Freq: Three times a day (TID) | ORAL | 0 refills | Status: DC | PRN

## 2020-08-29 NOTE — Patient Instructions (Signed)
Standing Labs We placed an order today for your standing lab work.   Please have your standing labs drawn in June and every 3 months   If possible, please have your labs drawn 2 weeks prior to your appointment so that the provider can discuss your results at your appointment.  Please note that you may see your imaging and lab results in MyChart before we have reviewed them. We may be awaiting multiple results to interpret others before contacting you. Please allow our office up to 72 hours to thoroughly review all of the results before contacting the office for clarification of your results.  We have open lab daily: Monday through Thursday from 1:30-4:30 PM and Friday from 1:30-4:00 PM at the office of Dr. Shaili Deveshwar, [facility].   Please be advised, all patients with office appointments requiring lab work will take precedent over walk-in lab work.  If possible, please come for your lab work on Monday and Friday afternoons, as you may experience shorter wait times. The office is located at [address], [address], [place], [place] [address] No appointment is necessary.   Labs are drawn by [facility]. Please bring your co-pay at the time of your lab draw.  You may receive a bill from [facility] for your lab work.  If you wish to have your labs drawn at another location, please call the office 24 hours in advance to send orders.  If you have any questions regarding directions or hours of operation,  please call [phone].   As a reminder, please drink plenty of water prior to coming for your lab work. Thanks!  

## 2020-09-23 ENCOUNTER — Telehealth: Payer: Self-pay

## 2020-09-23 NOTE — Telephone Encounter (Signed)
PA initiated via CoverMyMeds. Per previous note, current approval is still good until 10/21/20. Will mark for f/u at a more appropriate time.  Will need to verify if Rinvoq copay card info is still good or if we need to reapply:   Card: [id number]   Issued: 10/23/2019   Rx GROUP: [id number]   Rx BIN: [id number]   Rx PCN: [id number]   Suf: 01

## 2020-09-30 ENCOUNTER — Other Ambulatory Visit (HOSPITAL_COMMUNITY): Payer: Self-pay

## 2020-09-30 NOTE — Telephone Encounter (Signed)
Received notification from [facility] regarding a prior authorization for [facility]. Authorization has been APPROVED from 09/29/2020 to 09/29/2021.   Patient must fill through [facility]: [phone]   Authorization #  [id number] Phone # [phone]

## 2020-10-17 ENCOUNTER — Other Ambulatory Visit (HOSPITAL_COMMUNITY): Payer: Self-pay

## 2020-10-29 ENCOUNTER — Other Ambulatory Visit: Payer: Self-pay | Admitting: Rheumatology

## 2020-10-29 DIAGNOSIS — M0579 Rheumatoid arthritis with rheumatoid factor of multiple sites without organ or systems involvement: Secondary | ICD-10-CM

## 2020-10-29 NOTE — Telephone Encounter (Signed)
Next Visit: Due October   Last Visit: 08/29/2020  Last Fill: 06/17/2020  OL:[id number] arthritis involving multiple sites with positive rheumatoid factor  Current Dose per office note 08/29/2020: Rinvoq 15 mg 1 tablet by mouth daily  Labs: 06/16/2020 CBC and CMP WNL.    TB Skin Test: 01/29/2021   Left message to advise patient she is due to update labs and to schedule a follow up visit for October 2022.   Okay to refill Rinvoq?

## 2020-12-05 ENCOUNTER — Telehealth: Payer: Self-pay | Admitting: *Deleted

## 2020-12-05 NOTE — Telephone Encounter (Signed)
Left message to advise patient we received fax from [facility] stating they have been trying to reach patient to refill Rinvoq. They have been unsuccessful in reaching patient. Call back number is [phone]. Patient advised to contact the pharmacy  to set up shipment.

## 2021-01-01 ENCOUNTER — Other Ambulatory Visit: Payer: Self-pay | Admitting: Physician Assistant

## 2021-01-01 DIAGNOSIS — M0579 Rheumatoid arthritis with rheumatoid factor of multiple sites without organ or systems involvement: Secondary | ICD-10-CM

## 2021-01-26 NOTE — Progress Notes (Signed)
Office Visit Note  Patient: Lori Valentine             Date of Birth: [birth year]           MRN: [id number]             PCP: Sabino Dick, DO Referring: Sabino Dick, DO Visit Date: 01/27/2021 Occupation: @GUAROCC @  Subjective:  Flaring   History of Present Illness: Chamaine Stankus is a 35 y.o. female with history of seropositive rheumatoid arthritis.  She has clinically been doing well taking Rinvoq 15 mg 1 tablet daily, methotrexate 0.8 ms injections once weekly, folic acid 2 mg by mouth daily.  She states that she has missed 17 days of rinvoq and about 3 weeks of methotrexate due to recent infections.  She states that on Friday she started to have a flare in multiple joints.  She has been alternating Motrin and Tylenol as well as ice and heat for symptomatic relief.  She continues to have pain and inflammation in her left wrist, both hands, left shoulder, and right ankle joint.  She is having difficulty bearing full weight on her right ankle this morning.  She states that when she is able to take Rinvoq and methotrexate consistently she does not have any morning stiffness or nocturnal pain.  She states that prior to now this is the longest she has been able to go without steroids since her initial diagnosis.  She feels that combination therapy has been effective at managing her rheumatoid arthritis and does not want to make any medication changes.  She does need refills of both medications as well as updated lab work today.  She will be having updated TB skin test at work within the next couple of weeks. She states that she plans on scheduling an appointment with Dr. Monday for surgical consultation to discuss gastric bypass in the future.       Activities of Daily Living:  Patient reports morning stiffness for 0  none .   Patient Denies nocturnal pain.  Difficulty dressing/grooming: Reports Difficulty climbing stairs: Denies Difficulty getting out of chair: Denies Difficulty  using hands for taps, buttons, cutlery, and/or writing: Reports  Review of Systems  Constitutional:  Positive for fatigue.  HENT:  Positive for mouth dryness.   Eyes:  Negative for dryness.  Respiratory:  Negative for shortness of breath.   Cardiovascular:  Negative for swelling in legs/feet.  Gastrointestinal:  Negative for constipation.  Endocrine: Negative for excessive thirst.  Genitourinary:  Negative for difficulty urinating.  Musculoskeletal:  Positive for joint pain, joint pain and joint swelling.  Skin:  Negative for rash.  Allergic/Immunologic: Positive for susceptible to infections.  Neurological:  Negative for numbness.  Hematological:  Negative for bruising/bleeding tendency.  Psychiatric/Behavioral:  Negative for sleep disturbance.    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   History of anxiety and depression 10/22/2016   Smoker 10/22/2016   Rheumatoid arthritis (HCC) 05/13/2015   Hypothyroidism 07/29/2014    Past Medical History:  Diagnosis Date   Hypothyroid    Rheumatoid arthritis (HCC)    Twin gestation, 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  Multiple Gestation       MC/DA - O30.039  Concordant (< 20%), nml AFV, AGA    Discordant (>20%)**  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              DC/DA - O30.049  Concordant (<20%), nml AFV, AGA, no other comorbidities  Discordant (> 20%)**    O30.003 is extra code for discordant growth   Q 3 wks 16-32, Q 2 wks 32-delivery Q 1 wk, Fluid alternating w/ growth   20-24-28-32-36 Q 2-3 wks     Family History  Problem Relation Age of Onset    Schizophrenia Mother    Bipolar disorder Mother    Diabetes Other    Heart disease Other    Diabetes Sister    Healthy Son    Past Surgical History:  Procedure Laterality Date   CESAREAN SECTION N/A 08/04/2017   Procedure: CESAREAN SECTION;  Surgeon: Hermina Staggers, MD;  Location: [facility] BIRTHING SUIT[facility];  Service: Obstetrics;  Laterality: N/A;   TUBAL LIGATION Bilateral 08/04/2017   Procedure: BILATERAL TUBAL LIGATION;  Surgeon: Hermina Staggers, MD;  Location: [facility] BIRTHING SUITES;  Service: Obstetrics;  Laterality: Bilateral;   Social History   Social History Narrative   60 year old son   Lives with  Sister and nephew and son.   Works as Sales executive at [employer] History  Administered Date(s) Administered   Influenza,inj,Quad PF,6+ Mos 05/13/2015, 12/29/2016   Influenza-Unspecified 01/12/2018   Moderna Sars-Covid-2 Vaccination 05/28/2019, 07/03/2019   Tdap 05/13/2015, 06/15/2017     Objective: Vital Signs: BP 134/83 (BP Location: Left Arm, Patient Position: Sitting, Cuff Size: Normal)   Pulse 92   Resp 16   Ht 5\' 7"  (1.702 m)   Wt (!) 310 lb (140.6 kg)   BMI 48.55 kg/m    Physical Exam Vitals and nursing note reviewed.  Constitutional:      Appearance: She is well-developed.  HENT:     Head: Normocephalic and atraumatic.  Eyes:     Conjunctiva/sclera: Conjunctivae normal.  Pulmonary:     Effort: Pulmonary effort is normal.  Abdominal:     Palpations: Abdomen is soft.  Musculoskeletal:     Cervical back: Normal range of motion.  Skin:    General: Skin is warm and dry.     Capillary Refill: Capillary refill takes less than 2 seconds.  Neurological:     Mental Status: She is alert and oriented to person, place, and time.  Psychiatric:        Behavior: Behavior normal.     Musculoskeletal Exam: Patient remained seated during examination. C-spine has slightly limited ROM with lateral rotation to the left. Painful ROM of the left shoulder.   Elbow joints have good ROM with no tenderness or inflammation.  Limited ROM of both wrist joints.  Tenderness and synovitis of the left wrist.  Tenderness and synovitis of the right 3rd MCP and PIP joint.  Incomplete fist formation bilaterally.  Knee joints have good ROM with no warmth or effusion.  Tenderness and synovitis of the right ankle joint.   CDAI Exam: CDAI Score: 7.8  Patient Global: 4 mm; Provider Global: 4 mm Swollen: 4 ; Tender: 5  Joint Exam 01/27/2021      Right  Left  Glenohumeral      Tender  Wrist     Swollen Tender  MCP 3  Swollen Tender     PIP 3  Swollen Tender  Ankle  Swollen Tender        Investigation: No additional findings.  Imaging: No results found.  Recent Labs: Lab Results  Component Value Date   WBC 6.7 06/16/2020   HGB 12.2 06/16/2020   PLT 320 06/16/2020   NA 140 06/16/2020   K 4.4 06/16/2020   CL 106 06/16/2020   CO2 26 06/16/2020   GLUCOSE 90 06/16/2020   BUN 19 06/16/2020   CREATININE 0.70 06/16/2020   BILITOT 0.3 06/16/2020   ALKPHOS 253 (H) 08/04/2017   AST 18 06/16/2020   ALT 18 06/16/2020   PROT 7.0 06/16/2020   ALBUMIN 2.5 (L) 08/04/2017   CALCIUM 9.0 06/16/2020   GFRAA 132 06/16/2020   QFTBGOLD Negative 07/07/2016   QFTBGOLDPLUS NEGATIVE 11/28/2018    Speciality Comments: PPD skin test: 02/01/2020 negative.   Prior therapy: Enbrel, Cimzia, Remicade, Plaquenil, and MTX monotherapy  Procedures:  No procedures performed Allergies: Effexor [venlafaxine]   Assessment / Plan:     Visit Diagnoses: Rheumatoid arthritis involving multiple sites with positive rheumatoid factor [facility]): She presents today with tenderness and synovitis in multiple joints including the left wrist, right third MCP and PIP joints, and the right ankle joint.  She is having difficulty bearing full weight on her right ankle after the severity of pain and stiffness.  She has painful range of motion of the left shoulder and has been experiencing  nocturnal pain for the past several nights.  She has been experiencing a flare for the past 5 days.  She has tried alternating Tylenol and Motrin as well as ice and heat for symptomatic relief.  She has not taken rinvoq in 17 days and has missed the past 3 weeks of methotrexate due to recent infections.  Prior to holding Rinvoq and methotrexate she has found combination therapy to be effective.  Prior to the current flare she has not been experiencing any morning stiffness or nocturnal pain.  This has been the longest stretch of time since she has needed a prednisone taper since her initial diagnosis. We discussed the importance of trying to boost her immune system by taking vitamin C, zinc, and vitamin D.  We also discussed the importance of proper hand hygiene.  She was strongly encouraged to continue to hold rinvoq and methotrexate whenever she develops signs or symptoms of an infection and to resume only once the infection has completely cleared.  She was advised to notify us if she continues to have recurrent infections. We also discussed the importance of having lab monitoring every 3 months to monitor for drug toxicity.  CBC and CMP will be updated today.  Refills of rinvoq and methotrexate will also be sent to the pharmacy. I will send in a prednisone taper starting at 20 mg tapering by 5 mg every 4 days to treat the current flare. She was advised to notify us if she continues to have recurrent infections leading to more frequent flares.  She will follow-up in the office in 3 months.  High risk medication use -Rinvoq 15 mg 1 tablet by mouth daily, Methotrexate 0.8 ml sq injections once weekly, and folic acid 2 mg daily. CBC and CMP updated on 06/16/20. She is overdue to update lab work.  Orders for CBC and CMP were released.  Her next lab work will be due in January and every 3 months.  Standing orders for CBC and CMP are in place.  TB skin test negative on 01/30/20. Plan: COMPLETE METABOLIC PANEL WITH  GFR,  CBC with Differential/Platelet  Discussed the importance of holding Rinvoq and methotrexate if she develops signs or symptoms of an infection and to resume once infection has completely cleared. Discussed the importance of being up-to-date on all immunizations she is eligible for.  Weight gain: She plans on establishing care with Dr. Clent Ridges at [facility] discussing surgical consultation for gastric bypass in the future. Discussed that she will need to hold Rinvoq and methotrexate at least 1 week prior to surgery and ideally 2 weeks prior to surgery.  She will require clearance by Dr. Clent Ridges after surgery to resume these medications.  Discussed the concern for absorption of rinvoq after surgery.  Other medical conditions are listed as follows:   History of hypothyroidism  Anxiety and depression  Elevated LDL cholesterol level  Former smoker  Orders: Orders Placed This Encounter  Procedures   COMPLETE METABOLIC PANEL WITH GFR   CBC with Differential/Platelet   Meds ordered this encounter  Medications   predniSONE (DELTASONE) 5 MG tablet    Sig: Take 4 tablets by mouth daily x4 days, 3 tablets daily x4 days, 2 tablets daily x4 days, 1 tablet daily x4 days.    Dispense:  40 tablet    Refill:  0      Follow-Up Instructions: Return in about 3 months (around 04/29/2021) for Rheumatoid arthritis.   Gearldine Bienenstock, PA-C  Note - This record has been created using Dragon software.  Chart creation errors have been sought, but may not always  have been located. Such creation errors do not reflect on  the standard of medical care.

## 2021-01-27 ENCOUNTER — Encounter: Payer: Self-pay | Admitting: Physician Assistant

## 2021-01-27 ENCOUNTER — Ambulatory Visit (INDEPENDENT_AMBULATORY_CARE_PROVIDER_SITE_OTHER): Payer: 59 | Admitting: Physician Assistant

## 2021-01-27 ENCOUNTER — Other Ambulatory Visit: Payer: Self-pay

## 2021-01-27 VITALS — BP 134/83 | HR 92 | Resp 16 | Ht 67.0 in | Wt 310.0 lb

## 2021-01-27 DIAGNOSIS — F419 Anxiety disorder, unspecified: Secondary | ICD-10-CM | POA: Diagnosis not present

## 2021-01-27 DIAGNOSIS — M0579 Rheumatoid arthritis with rheumatoid factor of multiple sites without organ or systems involvement: Secondary | ICD-10-CM | POA: Diagnosis not present

## 2021-01-27 DIAGNOSIS — Z87891 Personal history of nicotine dependence: Secondary | ICD-10-CM | POA: Diagnosis not present

## 2021-01-27 DIAGNOSIS — E7800 Pure hypercholesterolemia, unspecified: Secondary | ICD-10-CM

## 2021-01-27 DIAGNOSIS — R635 Abnormal weight gain: Secondary | ICD-10-CM | POA: Diagnosis not present

## 2021-01-27 DIAGNOSIS — Z8639 Personal history of other endocrine, nutritional and metabolic disease: Secondary | ICD-10-CM | POA: Diagnosis not present

## 2021-01-27 DIAGNOSIS — Z79899 Other long term (current) drug therapy: Secondary | ICD-10-CM | POA: Diagnosis not present

## 2021-01-27 DIAGNOSIS — F32A Depression, unspecified: Secondary | ICD-10-CM | POA: Diagnosis not present

## 2021-01-27 MED ORDER — METHOTREXATE SODIUM CHEMO INJECTION 50 MG/2ML: INTRAMUSCULAR | 0 refills | Status: DC

## 2021-01-27 MED ORDER — FOLIC ACID 1 MG PO TABS: 2.0000 mg | ORAL_TABLET | Freq: Every day | ORAL | 3 refills | Status: DC

## 2021-01-27 MED ORDER — RINVOQ 15 MG PO TB24: 1.0000 | ORAL_TABLET | Freq: Every day | ORAL | 0 refills | Status: DC

## 2021-01-27 MED ORDER — PREDNISONE 5 MG PO TABS: ORAL_TABLET | ORAL | 0 refills | Status: DC

## 2021-01-27 NOTE — Telephone Encounter (Signed)
Patient requested refills of MTX, folic acid and rinvoq today. Please review and send to the pharmacies. Thanks!

## 2021-01-27 NOTE — Patient Instructions (Signed)
Standing Labs We placed an order today for your standing lab work.   Please have your standing labs drawn in January and every 3 months   If possible, please have your labs drawn 2 weeks prior to your appointment so that the provider can discuss your results at your appointment.  Please note that you may see your imaging and lab results in MyChart before we have reviewed them. We may be awaiting multiple results to interpret others before contacting you. Please allow our office up to 72 hours to thoroughly review all of the results before contacting the office for clarification of your results.  We have open lab daily: Monday through Thursday from 1:30-4:30 PM and Friday from 1:30-4:00 PM at the office of Dr. Shaili Deveshwar, [facility].   Please be advised, all patients with office appointments requiring lab work will take precedent over walk-in lab work.  If possible, please come for your lab work on Monday and Friday afternoons, as you may experience shorter wait times. The office is located at [address], [address], [place], [place] [address] No appointment is necessary.   Labs are drawn by [facility]. Please bring your co-pay at the time of your lab draw.  You may receive a bill from [facility] for your lab work.  If you wish to have your labs drawn at another location, please call the office 24 hours in advance to send orders.  If you have any questions regarding directions or hours of operation,  please call [phone].   As a reminder, please drink plenty of water prior to coming for your lab work. Thanks!  

## 2021-01-28 LAB — CBC WITH DIFFERENTIAL/PLATELET
Absolute Monocytes: 295 cells/uL (ref 200–950)
Basophils Absolute: 20 cells/uL (ref 0–200)
Basophils Relative: 0.3 %
Eosinophils Absolute: 60 cells/uL (ref 15–500)
Eosinophils Relative: 0.9 %
HCT: 39.2 % (ref 35.0–45.0)
Hemoglobin: 12.9 g/dL (ref 11.7–15.5)
Lymphs Abs: 2251 cells/uL (ref 850–3900)
MCH: 29.1 pg (ref 27.0–33.0)
MCHC: 32.9 g/dL (ref 32.0–36.0)
MCV: 88.3 fL (ref 80.0–100.0)
MPV: 8.6 fL (ref 7.5–12.5)
Monocytes Relative: 4.4 %
Neutro Abs: 4074 cells/uL (ref 1500–7800)
Neutrophils Relative %: 60.8 %
Platelets: 319 10*3/uL (ref 140–400)
RBC: 4.44 10*6/uL (ref 3.80–5.10)
RDW: 13 % (ref 11.0–15.0)
Total Lymphocyte: 33.6 %
WBC: 6.7 10*3/uL (ref 3.8–10.8)

## 2021-01-28 LAB — COMPLETE METABOLIC PANEL WITH GFR
AG Ratio: 1.6 (calc) (ref 1.0–2.5)
ALT: 19 U/L (ref 6–29)
AST: 18 U/L (ref 10–30)
Albumin: 4.2 g/dL (ref 3.6–5.1)
Alkaline phosphatase (APISO): 74 U/L (ref 31–125)
BUN: 14 mg/dL (ref 7–25)
CO2: 24 mmol/L (ref 20–32)
Calcium: 8.7 mg/dL (ref 8.6–10.2)
Chloride: 106 mmol/L (ref 98–110)
Creat: 0.6 mg/dL (ref 0.50–0.97)
Globulin: 2.7 g/dL (calc) (ref 1.9–3.7)
Glucose, Bld: 80 mg/dL (ref 65–99)
Potassium: 4.6 mmol/L (ref 3.5–5.3)
Sodium: 138 mmol/L (ref 135–146)
Total Bilirubin: 0.3 mg/dL (ref 0.2–1.2)
Total Protein: 6.9 g/dL (ref 6.1–8.1)
eGFR: 121 mL/min/{1.73_m2} (ref >=60)

## 2021-01-28 NOTE — Progress Notes (Signed)
CBC and CMP WNL

## 2021-01-30 ENCOUNTER — Ambulatory Visit: Payer: 59 | Admitting: Rheumatology

## 2021-04-14 NOTE — Progress Notes (Deleted)
Office Visit Note  Patient: Lori Valentine             Date of Birth: 1986/04/05           MRN: [id number]             PCP: Sharion Settler, DO Referring: Sharion Settler, DO Visit Date: 04/28/2021 Occupation: @GUAROCC @  Subjective:    History of Present Illness: Lori Valentine is a 36 y.o. female with history of seropositive rheumatoid arthritis.  She is taking rinvoq 15 mg 1 tablet by mouth daily, methotrexate 0.8 ml sq injections once weekly, and folic acid 2 mg daily.    CBC and CMP updated on 01/27/21.  She is overdue to update lab work.  Orders for CBC and CMP updated today.   Activities of Daily Living:  Patient reports morning stiffness for *** {minute/hour:19697}.   Patient {ACTIONS;DENIES/REPORTS:21021675::"Denies"} nocturnal pain.  Difficulty dressing/grooming: {ACTIONS;DENIES/REPORTS:21021675::"Denies"} Difficulty climbing stairs: {ACTIONS;DENIES/REPORTS:21021675::"Denies"} Difficulty getting out of chair: {ACTIONS;DENIES/REPORTS:21021675::"Denies"} Difficulty using hands for taps, buttons, cutlery, and/or writing: {ACTIONS;DENIES/REPORTS:21021675::"Denies"}  No Rheumatology ROS completed.   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   History of anxiety and depression 10/22/2016   Smoker 10/22/2016   Rheumatoid arthritis ([place]) 05/13/2015   Hypothyroidism 07/29/2014    Past Medical History:  Diagnosis Date   Hypothyroid    Rheumatoid arthritis ([place])    Twin gestation, 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        Multiple Gestation       MC/DA - O30.039  Concordant (< 20%), nml AFV, AGA     Discordant (>20%)**  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              DC/DA - O30.049  Concordant (<20%), nml AFV, AGA, no other comorbidities  Discordant (> 20%)**    O30.003 is extra code for discordant growth   Q 3 wks 16-32, Q 2 wks 32-delivery Q 1 wk, Fluid alternating w/ growth   20-24-28-32-36 Q 2-3 wks     Family History  Problem Relation Age of Onset   Schizophrenia Mother    Bipolar disorder Mother    Diabetes Other    Heart disease Other    Diabetes Sister    Healthy Son    Past Surgical History:  Procedure Laterality Date   CESAREAN SECTION N/A 08/04/2017   Procedure: CESAREAN SECTION;  Surgeon: Chancy Milroy, MD;  Location: [place];  Service: Obstetrics;  Laterality: N/A;   TUBAL LIGATION Bilateral 08/04/2017   Procedure: BILATERAL TUBAL LIGATION;  Surgeon: Chancy Milroy, MD;  Location: [place];  Service: Obstetrics;  Laterality: Bilateral;   Social History   Social History Narrative   62 year old son   Lives with  Sister and nephew and son.   Works as Art therapist at [employer] History  Administered Date(s) Administered   Influenza,inj,Quad PF,6+ Mos 05/13/2015, 12/29/2016   Influenza-Unspecified 01/12/2018   Moderna Sars-Covid-2 Vaccination 05/28/2019, 07/03/2019   Tdap 05/13/2015, 06/15/2017     Objective: Vital Signs: There were no vitals taken for this visit.   Physical Exam Vitals and nursing note reviewed.  Constitutional:  Appearance: She is well-developed.  HENT:     Head: Normocephalic and atraumatic.  Eyes:     Conjunctiva/sclera: Conjunctivae normal.  Pulmonary:     Effort: Pulmonary effort is normal.  Abdominal:     Palpations: Abdomen is soft.  Musculoskeletal:     Cervical back: Normal range of motion.  Skin:    General: Skin is warm and dry.     Capillary Refill: Capillary refill takes less than 2 seconds.  Neurological:     Mental Status: She is alert and oriented to person,  place, and time.  Psychiatric:        Behavior: Behavior normal.     Musculoskeletal Exam: ***  CDAI Exam: CDAI Score: -- Patient Global: --; Provider Global: -- Swollen: --; Tender: -- Joint Exam 04/28/2021   No joint exam has been documented for this visit   There is currently no information documented on the homunculus. Go to the Rheumatology activity and complete the homunculus joint exam.  Investigation: No additional findings.  Imaging: No results found.  Recent Labs: Lab Results  Component Value Date   WBC 6.7 01/27/2021   HGB 12.9 01/27/2021   PLT 319 01/27/2021   NA 138 01/27/2021   K 4.6 01/27/2021   CL 106 01/27/2021   CO2 24 01/27/2021   GLUCOSE 80 01/27/2021   BUN 14 01/27/2021   CREATININE 0.60 01/27/2021   BILITOT 0.3 01/27/2021   ALKPHOS 253 (H) 08/04/2017   AST 18 01/27/2021   ALT 19 01/27/2021   PROT 6.9 01/27/2021   ALBUMIN 2.5 (L) 08/04/2017   CALCIUM 8.7 01/27/2021   GFRAA 132 06/16/2020   QFTBGOLD Negative 07/07/2016   QFTBGOLDPLUS NEGATIVE 11/28/2018    Speciality Comments: PPD skin test: 02/01/2020 negative.   Prior therapy: Enbrel, Cimzia, Remicade, Plaquenil, and MTX monotherapy  Procedures:  No procedures performed Allergies: Effexor [venlafaxine]   Assessment / Plan:     Visit Diagnoses: No diagnosis found.  Orders: No orders of the defined types were placed in this encounter.  No orders of the defined types were placed in this encounter.   Face-to-face time spent with patient was *** minutes. Greater than 50% of time was spent in counseling and coordination of care.  Follow-Up Instructions: No follow-ups on file.   Earnestine Mealing, CMA  Note - This record has been created using Editor, commissioning.  Chart creation errors have been sought, but may not always  have been located. Such creation errors do not reflect on  the standard of medical care.

## 2021-04-28 ENCOUNTER — Ambulatory Visit: Payer: 59 | Admitting: Physician Assistant

## 2021-04-28 DIAGNOSIS — Z8639 Personal history of other endocrine, nutritional and metabolic disease: Secondary | ICD-10-CM

## 2021-04-28 DIAGNOSIS — M0579 Rheumatoid arthritis with rheumatoid factor of multiple sites without organ or systems involvement: Secondary | ICD-10-CM

## 2021-04-28 DIAGNOSIS — R635 Abnormal weight gain: Secondary | ICD-10-CM

## 2021-04-28 DIAGNOSIS — E7800 Pure hypercholesterolemia, unspecified: Secondary | ICD-10-CM

## 2021-04-28 DIAGNOSIS — Z87891 Personal history of nicotine dependence: Secondary | ICD-10-CM

## 2021-04-28 DIAGNOSIS — Z79899 Other long term (current) drug therapy: Secondary | ICD-10-CM

## 2021-04-28 DIAGNOSIS — F419 Anxiety disorder, unspecified: Secondary | ICD-10-CM

## 2021-05-01 ENCOUNTER — Other Ambulatory Visit: Payer: Self-pay | Admitting: Physician Assistant

## 2021-05-01 DIAGNOSIS — M0579 Rheumatoid arthritis with rheumatoid factor of multiple sites without organ or systems involvement: Secondary | ICD-10-CM

## 2021-05-01 NOTE — Telephone Encounter (Signed)
Next Visit: 05/07/2021  Last Visit: 01/27/2021  Last Fill: 01/27/2021  DX: Rheumatoid arthritis involving multiple sites with positive rheumatoid factor   Current Dose per office note 01/27/2021: Rinvoq 15 mg 1 tablet by mouth daily  Labs: 01/27/2021 CBC and CMP WNL  TB Skin Test: 01/30/2020 Neg   Patient to update labs at upcoming appointment on 05/07/2021  Okay to refill Rinvoq?

## 2021-05-07 ENCOUNTER — Ambulatory Visit: Payer: 59 | Admitting: Physician Assistant

## 2021-07-09 ENCOUNTER — Other Ambulatory Visit: Payer: Self-pay | Admitting: Physician Assistant

## 2021-07-09 DIAGNOSIS — M0579 Rheumatoid arthritis with rheumatoid factor of multiple sites without organ or systems involvement: Secondary | ICD-10-CM

## 2021-08-03 NOTE — Progress Notes (Signed)
? ?Office Visit Note ? ?Patient: Lori Valentine             ?Date of Birth: Jun 28, 1985           ?MRN: [id number]             ?PCP: Sharion Settler, DO ?Referring: Sharion Settler, DO ?Visit Date: 08/14/2021 ?Occupation: @GUAROCC @ ? ?Subjective:  ?Discuss medications  ? ?History of Present Illness: Lori Valentine is a 36 y.o. female with history of seropositive rheumatoid arthritis.  Patient was last seen in the office on 01/27/2021.  Patient is currently prescribed Rinvoq 15 mg 1 tablet by mouth daily.  She had a recent gap in therapy due to issues with insurance as well as requiring a refill.  She has been out of Enbrel for the past 6 days.  She states that after her last office visit October 2022 she did not restart on methotrexate.  She states that somebody at work was previously administering the weekly injections for her but states that the employee no longer works there.  She states that she tried to self administer the methotrexate but vomited during the process.  She is apprehensive to restart on injectable methotrexate but is willing to try oral methotrexate.  She would like refills of Enbrel, methotrexate, and folic acid to be sent to the pharmacy.  She also requested a refill of Zofran which she takes as needed if she develops nausea after taking methotrexate.  She is currently having a flare in multiple joints.  She is having pain and stiffness in both shoulders, both wrists, both hands.  She has pain and swelling in her right wrist and hand currently.  She requested a prednisone taper to be sent to the pharmacy to treat her current flare.  She states that during her flares she has noticed increased gingivitis.  She has not followed up with her dentist yet but plans to.  She states that she has been trying to reestablish care and prioritize her health.  She recently had a visit with her ophthalmologist as well as a mental health specialist ?She denies any recent infections. ? ? ? ? ?Activities of  Daily Living:  ?Patient reports morning stiffness for several hours.   ?Patient Denies nocturnal pain.  ?Difficulty dressing/grooming: Reports ?Difficulty climbing stairs: Reports ?Difficulty getting out of chair: Reports ?Difficulty using hands for taps, buttons, cutlery, and/or writing: Reports ? ?Review of Systems  ?Constitutional:  Positive for fatigue.  ?HENT:  Negative for mouth sores, mouth dryness and nose dryness.   ?Eyes:  Negative for pain, itching and dryness.  ?Respiratory:  Negative for shortness of breath and difficulty breathing.   ?Cardiovascular:  Negative for chest pain and palpitations.  ?Gastrointestinal:  Negative for blood in stool, constipation and diarrhea.  ?Endocrine: Negative for increased urination.  ?Genitourinary:  Negative for difficulty urinating.  ?Musculoskeletal:  Positive for joint pain, joint pain, joint swelling and morning stiffness. Negative for myalgias, muscle tenderness and myalgias.  ?Skin:  Negative for color change, rash and redness.  ?Allergic/Immunologic: Negative for susceptible to infections.  ?Neurological:  Positive for weakness. Negative for dizziness, numbness, headaches and memory loss.  ?Hematological:  Negative for bruising/bleeding tendency.  ?Psychiatric/Behavioral:  Negative for confusion.   ? ?PMFS History:  ?Patient Active Problem List  ? Diagnosis Date Noted  ? Difficulty concentrating 02/03/2018  ? Anemia 09/08/2017  ? Normal labor 08/04/2017  ? Severe obesity (BMI >= 40) ([place]) 07/28/2017  ? Obesity in pregnancy, antepartum 03/11/2017  ?  Maternal rheumatoid arthritis complicating pregnancy ([place]) 03/11/2017  ? Supervision of high-risk pregnancy 02/08/2017  ? Twin gestation, 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  ? History of anxiety and depression 10/22/2016  ? Smoker 10/22/2016  ? Rheumatoid arthritis ([place]) 05/13/2015  ? Hypothyroidism 07/29/2014  ?  ?Past Medical History:  ?Diagnosis Date  ? Hypothyroid   ? Rheumatoid arthritis ([place])   ? Twin gestation,  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  ?      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  ?Family History  ?Problem Relation Age of Onset  ? Schizophrenia Mother   ? Bipolar disorder Mother   ? Diabetes Other   ? Heart disease Other   ? Diabetes Sister   ? Healthy Son   ? ?Past Surgical History:  ?Procedure Laterality Date  ? CESAREAN SECTION N/A 08/04/2017  ? Procedure: CESAREAN SECTION;  Surgeon: Chancy Milroy, MD;  Location: [facility];  Service: Obstetrics;  Laterality: N/A;  ? TUBAL LIGATION Bilateral 08/04/2017  ? Procedure: BILATERAL TUBAL LIGATION;  Surgeon: Chancy Milroy, MD;  Location: [place];  Service: Obstetrics;  Laterality: Bilateral;  ? ?Social History  ? ?Social History Narrative  ? 55 year old son  ? Lives with  Sister and nephew and son.  ? Works as Art therapist at [employer]  ? ?Immunization History  ?Administered Date(s) Administered  ? Influenza,inj,Quad PF,6+ Mos 05/13/2015, 12/29/2016  ? Influenza-Unspecified 01/12/2018  ? Moderna Sars-Covid-2 Vaccination 05/28/2019, 07/03/2019  ? Tdap 05/13/2015, 06/15/2017  ?  ? ?Objective: ?Vital Signs: BP 120/85 (BP Location: Left Arm, Patient Position: Sitting, Cuff Size: Large)   Pulse 86   Ht 5\' 7"  (1.702 m)   Wt (!) 324 lb 3.2 oz (147.1 kg)   BMI 50.78 kg/m?   ? ?Physical Exam ?Vitals and nursing note reviewed.  ?Constitutional:   ?   Appearance: She is well-developed.  ?HENT:  ?   Head: Normocephalic and atraumatic.  ?Eyes:  ?   Conjunctiva/sclera: Conjunctivae normal.  ?Cardiovascular:  ?   Rate and Rhythm: Normal rate and regular rhythm.  ?   Heart sounds: Normal  heart sounds.  ?Pulmonary:  ?   Effort: Pulmonary effort is normal.  ?   Breath sounds: Normal breath sounds.  ?Abdominal:  ?   General: Bowel sounds are normal.  ?   Palpations: Abdomen is soft.  ?Musculoskeletal:  ?   Cervical back: Normal range of motion.  ?Skin: ?   General: Skin is warm and dry.  ?   Capillary Refill: Capillary refill takes less than 2 seconds.  ?Neurological:  ?   Mental Status: She is alert and oriented to person, place, and time.  ?Psychiatric:     ?   Behavior: Behavior normal.  ?  ? ?Musculoskeletal Exam: C-spine has slightly limited range of motion with stiffness with lateral rotation.  Painful range of motion above shoulder joints with tenderness upon palpation.  Right elbow joint has mild flexion contracture with a rheumatoid nodule on  the extensor surface.  Limited extension of both wrist joints especially the right wrist.  Tenderness and synovitis of the right wrist was noted.  Tenderness and synovitis of the right first, second, and fifth MCP joints.  Tenderness and synovitis of the second and third PIP joints.  Incomplete right fist formation noted.  Knee joints have good range of motion with no warmth or effusion.  Ankle joints have good range of motion with no tenderness or joint swelling. ? ?CDAI Exam: ?CDAI Score: 16.2  ?Patient Global: 4 mm; Provider Global: 8 mm ?Swollen: 6 ; Tender: 9  ?Joint Exam 08/14/2021  ? ?   Right  Left  ?Glenohumeral   Tender   Tender  ?Wrist  Swollen Tender   Tender  ?MCP 1  Swollen Tender     ?MCP 2  Swollen Tender     ?MCP 5  Swollen Tender     ?PIP 2  Swollen Tender     ?PIP 3  Swollen Tender     ? ? ? ?Investigation: ?No additional findings. ? ?Imaging: ?No results found. ? ?Recent Labs: ?Lab Results  ?Component Value Date  ? WBC 6.7 01/27/2021  ? HGB 12.9 01/27/2021  ? PLT 319 01/27/2021  ? NA 138 01/27/2021  ? K 4.6 01/27/2021  ? CL 106 01/27/2021  ? CO2 24 01/27/2021  ? GLUCOSE 80 01/27/2021  ? BUN 14 01/27/2021  ? CREATININE 0.60 01/27/2021   ? BILITOT 0.3 01/27/2021  ? ALKPHOS 253 (H) 08/04/2017  ? AST 18 01/27/2021  ? ALT 19 01/27/2021  ? PROT 6.9 01/27/2021  ? ALBUMIN 2.5 (L) 08/04/2017  ? CALCIUM 8.7 01/27/2021  ? GFRAA 132 06/16/2020  ? QFTBGOLD

## 2021-08-14 ENCOUNTER — Ambulatory Visit (INDEPENDENT_AMBULATORY_CARE_PROVIDER_SITE_OTHER): Payer: 59 | Admitting: Physician Assistant

## 2021-08-14 ENCOUNTER — Encounter: Payer: Self-pay | Admitting: Physician Assistant

## 2021-08-14 VITALS — BP 120/85 | HR 86 | Ht 67.0 in | Wt 324.2 lb

## 2021-08-14 DIAGNOSIS — E7800 Pure hypercholesterolemia, unspecified: Secondary | ICD-10-CM | POA: Diagnosis not present

## 2021-08-14 DIAGNOSIS — M0579 Rheumatoid arthritis with rheumatoid factor of multiple sites without organ or systems involvement: Secondary | ICD-10-CM | POA: Diagnosis not present

## 2021-08-14 DIAGNOSIS — Z79899 Other long term (current) drug therapy: Secondary | ICD-10-CM | POA: Diagnosis not present

## 2021-08-14 DIAGNOSIS — Z8639 Personal history of other endocrine, nutritional and metabolic disease: Secondary | ICD-10-CM

## 2021-08-14 DIAGNOSIS — F419 Anxiety disorder, unspecified: Secondary | ICD-10-CM

## 2021-08-14 DIAGNOSIS — F32A Depression, unspecified: Secondary | ICD-10-CM

## 2021-08-14 DIAGNOSIS — Z87891 Personal history of nicotine dependence: Secondary | ICD-10-CM

## 2021-08-14 DIAGNOSIS — B002 Herpesviral gingivostomatitis and pharyngotonsillitis: Secondary | ICD-10-CM

## 2021-08-14 MED ORDER — FOLIC ACID 1 MG PO TABS
2.0000 mg | ORAL_TABLET | Freq: Every day | ORAL | 3 refills | Status: AC
Start: 2021-08-14 — End: ?

## 2021-08-14 MED ORDER — RINVOQ 15 MG PO TB24: 1.0000 | ORAL_TABLET | Freq: Every day | ORAL | 0 refills | Status: DC

## 2021-08-14 MED ORDER — PREDNISONE 5 MG PO TABS: ORAL_TABLET | ORAL | 0 refills | Status: DC

## 2021-08-14 MED ORDER — VALACYCLOVIR HCL 1 G PO TABS: 1000.0000 mg | ORAL_TABLET | Freq: Two times a day (BID) | ORAL | 0 refills | Status: AC

## 2021-08-14 MED ORDER — METHOTREXATE 2.5 MG PO TABS: 20.0000 mg | ORAL_TABLET | ORAL | 0 refills | Status: DC

## 2021-08-14 MED ORDER — ONDANSETRON HCL 4 MG PO TABS: 4.0000 mg | ORAL_TABLET | Freq: Three times a day (TID) | ORAL | 0 refills | Status: AC | PRN

## 2021-08-14 NOTE — Patient Instructions (Signed)
Standing Labs ?We placed an order today for your standing lab work.  ? ?Please have your standing labs drawn in august and every 3 months  ? ?If possible, please have your labs drawn 2 weeks prior to your appointment so that the provider can discuss your results at your appointment. ? ?Please note that you may see your imaging and lab results in MyChart before we have reviewed them. ?We may be awaiting multiple results to interpret others before contacting you. ?Please allow our office up to 72 hours to thoroughly review all of the results before contacting the office for clarification of your results. ? ?We have open lab daily: ?Monday through Thursday from 1:30-4:30 PM and Friday from 1:30-4:00 PM ?at the office of Dr. Pollyann Savoy, [facility].   ?Please be advised, all patients with office appointments requiring lab work will take precedent over walk-in lab work.  ?If possible, please come for your lab work on Monday and Friday afternoons, as you may experience shorter wait times. ?The office is located at [address], [place], [place] [address] ?No appointment is necessary.   ?Labs are drawn by [facility]. Please bring your co-pay at the time of your lab draw.  You may receive a bill from [facility] for your lab work. ? ?Please note if you are on Hydroxychloroquine and and an order has been placed for a Hydroxychloroquine level, you will need to have it drawn 4 hours or more after your last dose. ? ?If you wish to have your labs drawn at another location, please call the office 24 hours in advance to send orders. ? ?If you have any questions regarding directions or hours of operation,  ?please call [phone].   ?As a reminder, please drink plenty of water prior to coming for your lab work. Thanks! ? ?

## 2021-08-15 LAB — CBC WITH DIFFERENTIAL/PLATELET
Absolute Monocytes: 348 cells/uL (ref 200–950)
Basophils Absolute: 7 cells/uL (ref 0–200)
Basophils Relative: 0.1 %
Eosinophils Absolute: 101 cells/uL (ref 15–500)
Eosinophils Relative: 1.5 %
HCT: 40.7 % (ref 35.0–45.0)
Hemoglobin: 13.1 g/dL (ref 11.7–15.5)
Lymphs Abs: 1943 cells/uL (ref 850–3900)
MCH: 28.4 pg (ref 27.0–33.0)
MCHC: 32.2 g/dL (ref 32.0–36.0)
MCV: 88.1 fL (ref 80.0–100.0)
MPV: 9 fL (ref 7.5–12.5)
Monocytes Relative: 5.2 %
Neutro Abs: 4301 cells/uL (ref 1500–7800)
Neutrophils Relative %: 64.2 %
Platelets: 317 10*3/uL (ref 140–400)
RBC: 4.62 10*6/uL (ref 3.80–5.10)
RDW: 14.1 % (ref 11.0–15.0)
Total Lymphocyte: 29 %
WBC: 6.7 10*3/uL (ref 3.8–10.8)

## 2021-08-15 LAB — COMPLETE METABOLIC PANEL WITH GFR
AG Ratio: 1.6 (calc) (ref 1.0–2.5)
ALT: 32 U/L — ABNORMAL HIGH (ref 6–29)
AST: 20 U/L (ref 10–30)
Albumin: 4.2 g/dL (ref 3.6–5.1)
Alkaline phosphatase (APISO): 90 U/L (ref 31–125)
BUN: 15 mg/dL (ref 7–25)
CO2: 25 mmol/L (ref 20–32)
Calcium: 8.8 mg/dL (ref 8.6–10.2)
Chloride: 105 mmol/L (ref 98–110)
Creat: 0.65 mg/dL (ref 0.50–0.97)
Globulin: 2.7 g/dL (calc) (ref 1.9–3.7)
Glucose, Bld: 95 mg/dL (ref 65–99)
Potassium: 4.4 mmol/L (ref 3.5–5.3)
Sodium: 139 mmol/L (ref 135–146)
Total Bilirubin: 0.3 mg/dL (ref 0.2–1.2)
Total Protein: 6.9 g/dL (ref 6.1–8.1)
eGFR: 118 mL/min/{1.73_m2} (ref >=60)

## 2021-08-15 LAB — LIPID PANEL
Cholesterol: 205 mg/dL — ABNORMAL HIGH (ref <200)
HDL: 31 mg/dL — ABNORMAL LOW (ref >=50)
LDL Cholesterol (Calc): 141 mg/dL (calc) — ABNORMAL HIGH
Non-HDL Cholesterol (Calc): 174 mg/dL (calc) — ABNORMAL HIGH (ref <130)
Total CHOL/HDL Ratio: 6.6 (calc) — ABNORMAL HIGH (ref <5.0)
Triglycerides: 189 mg/dL — ABNORMAL HIGH (ref <150)

## 2021-08-17 NOTE — Progress Notes (Signed)
CBC WNL. ALT borderline elevated-32.  Rest of CMP WNL.  Please advise the patient to avoid the use of tylenol, NSAIDs, and alcohol use.  ?Total cholesterol is borderline elevated.  HDL remains low and TG and LDL remain elevated but have improved slightly.  Please forward results to PCP.

## 2021-09-01 ENCOUNTER — Telehealth: Payer: Self-pay | Admitting: Pharmacy Technician

## 2021-09-01 NOTE — Telephone Encounter (Signed)
Submitted a Prior Authorization request to [facility] for [facility] via CoverMyMeds. Will update once we receive a response.   (Key: [id number]) - [id number]

## 2021-09-01 NOTE — Telephone Encounter (Signed)
[facility]. Authorization has been APPROVED from 09/01/21 to 09/02/22.   Authorization # (Key: [id number]) - [id number]

## 2021-09-07 ENCOUNTER — Other Ambulatory Visit: Payer: Self-pay | Admitting: *Deleted

## 2021-09-08 ENCOUNTER — Other Ambulatory Visit: Payer: Self-pay | Admitting: Rheumatology

## 2021-09-08 ENCOUNTER — Encounter: Payer: Self-pay | Admitting: *Deleted

## 2021-09-08 MED ORDER — PREDNISONE 5 MG PO TABS: ORAL_TABLET | ORAL | 0 refills | Status: DC

## 2021-09-08 NOTE — Telephone Encounter (Signed)
See phone call note on 09/07/2021 for details.

## 2021-09-08 NOTE — Telephone Encounter (Signed)
Okay to send prednisone taper starting at 20 mg and taper by 5 mg every 4 days.  Please let patient know that the prednisone can cause weight gain, increase in blood pressure, increase in blood sugar and osteoporosis.

## 2021-09-08 NOTE — Telephone Encounter (Signed)
Patient called the office stating she sent an urgent MyChart message regarding medication and a flare. Patient requests a call back as soon as possible stating she is starting to swell in her hands and feet. Patient states due to her blood work she cannot take anything OTC and needs something urgently. Patient requests a call on her work # [phone].

## 2021-09-08 NOTE — Telephone Encounter (Signed)
Spoke with pharmacy and the pharmacist states the prescription was sent in for a 90 day supply and her insurance will only cover a 30 day supply. The pharmacy is getting the MTX prescription ready. Patient states she is having swelling and pain in her right hand. Patient states she is having trouble with pain and swelling in her index and pinky fingers. Patient states her middle finger is starting to "turn" She states she is also having trouble with her right foot and left ankle. Patient is requesting a prednisone taper to help. Please advise.

## 2021-10-07 DIAGNOSIS — F332 Major depressive disorder, recurrent severe without psychotic features: Secondary | ICD-10-CM | POA: Diagnosis not present

## 2021-10-07 DIAGNOSIS — F331 Major depressive disorder, recurrent, moderate: Secondary | ICD-10-CM | POA: Diagnosis not present

## 2021-10-07 DIAGNOSIS — F909 Attention-deficit hyperactivity disorder, unspecified type: Secondary | ICD-10-CM | POA: Diagnosis not present

## 2021-10-07 DIAGNOSIS — F4312 Post-traumatic stress disorder, chronic: Secondary | ICD-10-CM | POA: Diagnosis not present

## 2021-11-06 NOTE — Progress Notes (Deleted)
Office Visit Note  Patient: Lori Valentine             Date of Birth: 01/07/1986           MRN: [id number]             PCP: Sabino Dick, DO Referring: Sabino Dick, DO Visit Date: 11/20/2021 Occupation: @GUAROCC @  Subjective:  No chief complaint on file.   History of Present Illness: Lori Valentine is a 36 y.o. female ***   Activities of Daily Living:  Patient reports morning stiffness for *** {minute/hour:19697}.   Patient {ACTIONS;DENIES/REPORTS:21021675::"Denies"} nocturnal pain.  Difficulty dressing/grooming: {ACTIONS;DENIES/REPORTS:21021675::"Denies"} Difficulty climbing stairs: {ACTIONS;DENIES/REPORTS:21021675::"Denies"} Difficulty getting out of chair: {ACTIONS;DENIES/REPORTS:21021675::"Denies"} Difficulty using hands for taps, buttons, cutlery, and/or writing: {ACTIONS;DENIES/REPORTS:21021675::"Denies"}  No Rheumatology ROS completed.   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   History of anxiety and depression 10/22/2016   Smoker 10/22/2016   Rheumatoid arthritis ([facility]) 05/13/2015   Hypothyroidism 07/29/2014    Past Medical History:  Diagnosis Date   Hypothyroid    Rheumatoid arthritis ([facility])    Twin gestation, 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        Multiple Gestation       MC/DA - O30.039  Concordant (< 20%), nml AFV, AGA    Discordant (>20%)**  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              DC/DA - O30.049  Concordant (<20%), nml AFV, AGA, no other comorbidities  Discordant (> 20%)**    O30.003 is extra code for discordant growth   Q 3 wks 16-32, Q 2 wks 32-delivery Q 1  wk, Fluid alternating w/ growth   20-24-28-32-36 Q 2-3 wks     Family History  Problem Relation Age of Onset   Schizophrenia Mother    Bipolar disorder Mother    Diabetes Other    Heart disease Other    Diabetes Sister    Healthy Son    Past Surgical History:  Procedure Laterality Date   CESAREAN SECTION N/A 08/04/2017   Procedure: CESAREAN SECTION;  Surgeon: 10/04/2017, MD;  Location: [facility] BIRTHING SUIT[facility];  Service: Obstetrics;  Laterality: N/A;   TUBAL LIGATION Bilateral 08/04/2017   Procedure: BILATERAL TUBAL LIGATION;  Surgeon: 10/04/2017, MD;  Location: [facility] BIRTHING SUITES;  Service: Obstetrics;  Laterality: Bilateral;   Social History   Social History Narrative   34 year old son   Lives with  Sister and nephew and son.   Works as 10 at [employer] History  Administered Date(s) Administered   Influenza,inj,Quad PF,6+ Mos 05/13/2015, 12/29/2016   Influenza-Unspecified 01/12/2018   Moderna Sars-Covid-2 Vaccination 05/28/2019, 07/03/2019   Tdap 05/13/2015, 06/15/2017     Objective: Vital Signs: There were no vitals taken for this visit.   Physical Exam   Musculoskeletal Exam: ***  CDAI Exam: CDAI Score: -- Patient Global: --; Provider Global: -- Swollen: --; Tender: -- Joint Exam 11/20/2021   No joint exam has been documented for this visit   There is currently no information documented on the homunculus. Go to the Rheumatology activity and complete the homunculus joint exam.  Investigation: No  additional findings.  Imaging: No results found.  Recent Labs: Lab Results  Component Value Date   WBC 6.7 08/14/2021   HGB 13.1 08/14/2021   PLT 317 08/14/2021   NA 139 08/14/2021   K 4.4 08/14/2021   CL 105 08/14/2021   CO2 25 08/14/2021   GLUCOSE 95 08/14/2021   BUN 15 08/14/2021   CREATININE 0.65 08/14/2021   BILITOT 0.3 08/14/2021   ALKPHOS 253 (H) 08/04/2017   AST 20 08/14/2021   ALT 32 (H) 08/14/2021    PROT 6.9 08/14/2021   ALBUMIN 2.5 (L) 08/04/2017   CALCIUM 8.8 08/14/2021   GFRAA 132 06/16/2020   QFTBGOLD Negative 07/07/2016   QFTBGOLDPLUS NEGATIVE 11/28/2018    Speciality Comments: PPD skin test: 02/01/2020 negative.   Prior therapy: Enbrel, Cimzia, Remicade, Plaquenil, and MTX monotherapy  Procedures:  No procedures performed Allergies: Effexor [venlafaxine]   Assessment / Plan:     Visit Diagnoses: No diagnosis found.  Orders: No orders of the defined types were placed in this encounter.  No orders of the defined types were placed in this encounter.   Face-to-face time spent with patient was *** minutes. Greater than 50% of time was spent in counseling and coordination of care.  Follow-Up Instructions: No follow-ups on file.   Ellen Henri, CMA  Note - This record has been created using Animal nutritionist.  Chart creation errors have been sought, but may not always  have been located. Such creation errors do not reflect on  the standard of medical care.

## 2021-11-10 DIAGNOSIS — F332 Major depressive disorder, recurrent severe without psychotic features: Secondary | ICD-10-CM | POA: Diagnosis not present

## 2021-11-10 DIAGNOSIS — F4312 Post-traumatic stress disorder, chronic: Secondary | ICD-10-CM | POA: Diagnosis not present

## 2021-11-10 DIAGNOSIS — F909 Attention-deficit hyperactivity disorder, unspecified type: Secondary | ICD-10-CM | POA: Diagnosis not present

## 2021-11-12 ENCOUNTER — Other Ambulatory Visit (HOSPITAL_COMMUNITY): Payer: Self-pay

## 2021-11-12 ENCOUNTER — Telehealth: Payer: Self-pay | Admitting: Pharmacy Technician

## 2021-11-12 NOTE — Telephone Encounter (Signed)
Patient Advocate Encounter  Received notification from [employer] that prior authorization for [facility]MG  is required.   PA submitted on 8.10.23 Key [id number]  Status is pending    [id number], CPhT Patient Advocate Phone: [phone]

## 2021-11-19 ENCOUNTER — Other Ambulatory Visit (HOSPITAL_COMMUNITY): Payer: Self-pay

## 2021-11-19 NOTE — Telephone Encounter (Signed)
Patient Advocate Encounter  Prior Authorization for [facility] 15[facility]  has been approved.    PA# ---  Effective dates: 8.10.23 through 8.8.24  Keola Heninger B. CPhT P: [phone] F: [phone]

## 2021-11-20 ENCOUNTER — Ambulatory Visit: Payer: BC Managed Care – PPO | Attending: Rheumatology | Admitting: Rheumatology

## 2021-11-20 DIAGNOSIS — Z79899 Other long term (current) drug therapy: Secondary | ICD-10-CM

## 2021-11-20 DIAGNOSIS — B002 Herpesviral gingivostomatitis and pharyngotonsillitis: Secondary | ICD-10-CM

## 2021-11-20 DIAGNOSIS — Z8639 Personal history of other endocrine, nutritional and metabolic disease: Secondary | ICD-10-CM

## 2021-11-20 DIAGNOSIS — E7800 Pure hypercholesterolemia, unspecified: Secondary | ICD-10-CM

## 2021-11-20 DIAGNOSIS — Z87891 Personal history of nicotine dependence: Secondary | ICD-10-CM

## 2021-11-20 DIAGNOSIS — M0579 Rheumatoid arthritis with rheumatoid factor of multiple sites without organ or systems involvement: Secondary | ICD-10-CM

## 2021-11-20 DIAGNOSIS — F419 Anxiety disorder, unspecified: Secondary | ICD-10-CM

## 2021-12-11 NOTE — Progress Notes (Unsigned)
Office Visit Note  Patient: Lori Valentine             Date of Birth: 1985/04/11           MRN: [id number]             PCP: Sabino Dick, DO Referring: Sabino Dick, DO Visit Date: 12/15/2021 Occupation: @GUAROCC @  Subjective:  No chief complaint on file.   History of Present Illness: Lori Valentine is a 36 y.o. female ***   Activities of Daily Living:  Patient reports morning stiffness for *** {minute/hour:19697}.   Patient {ACTIONS;DENIES/REPORTS:21021675::"Denies"} nocturnal pain.  Difficulty dressing/grooming: {ACTIONS;DENIES/REPORTS:21021675::"Denies"} Difficulty climbing stairs: {ACTIONS;DENIES/REPORTS:21021675::"Denies"} Difficulty getting out of chair: {ACTIONS;DENIES/REPORTS:21021675::"Denies"} Difficulty using hands for taps, buttons, cutlery, and/or writing: {ACTIONS;DENIES/REPORTS:21021675::"Denies"}  No Rheumatology ROS completed.   PMFS History:  Patient Active Problem List   Diagnosis Date Noted  . Difficulty concentrating 02/03/2018  . Anemia 09/08/2017  . Normal labor 08/04/2017  . Severe obesity (BMI >= 40) ([facility]) 07/28/2017  . Obesity in pregnancy, antepartum 03/11/2017  . Maternal rheumatoid arthritis complicating pregnancy ([facility]) 03/11/2017  . Supervision of high-risk pregnancy 02/08/2017  . 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 History of anxiety and depression 10/22/2016  . Smoker 10/22/2016  . Rheumatoid arthritis ([facility]) 05/13/2015  . Hypothyroidism 07/29/2014    Past Medical History:  Diagnosis Date  . Hypothyroid   . Rheumatoid arthritis ([facility])   . 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 Schizophrenia Mother   . Bipolar disorder Mother   . Diabetes Other   . Heart disease Other   . Diabetes Sister   . Healthy Son    Past Surgical History:  Procedure Laterality Date  . CESAREAN SECTION N/A 08/04/2017   Procedure: CESAREAN SECTION;  Surgeon: 10/04/2017, MD;  Location: [facility] BIRTHING SUITES;  Service: Obstetrics;  Laterality: N/A;  . TUBAL LIGATION Bilateral 08/04/2017   Procedure: BILATERAL TUBAL LIGATION;  Surgeon: 10/04/2017, MD;  Location: [facility] BIRTHING SUITES;  Service: Obstetrics;  Laterality: Bilateral;   Social History   Social History Narrative   74 year old son   Lives with  Sister and nephew and son.   Works as 10 at [employer] History  Administered Date(s) Administered  . Influenza,inj,Quad PF,6+ Mos 05/13/2015, 12/29/2016  . Influenza-Unspecified 01/12/2018  . Moderna Sars-Covid-2 Vaccination 05/28/2019, 07/03/2019  . Tdap 05/13/2015, 06/15/2017     Objective: Vital Signs: There were no vitals taken for this visit.   Physical Exam   Musculoskeletal Exam: ***  CDAI Exam: CDAI Score: -- Patient Global: --; Provider Global: -- Swollen: --; Tender: -- Joint Exam 12/15/2021   No joint exam has been documented for this visit   There is currently no information documented on the homunculus. Go to the Rheumatology activity and complete the homunculus joint exam.  Investigation: No  additional findings.  Imaging: No results found.  Recent Labs: Lab Results  Component Value Date   WBC 6.7 08/14/2021   HGB 13.1 08/14/2021   PLT 317 08/14/2021   NA 139 08/14/2021   K 4.4 08/14/2021   CL 105 08/14/2021   CO2 25 08/14/2021   GLUCOSE 95 08/14/2021   BUN 15 08/14/2021   CREATININE 0.65 08/14/2021   BILITOT 0.3 08/14/2021   ALKPHOS 253 (H) 08/04/2017   AST 20 08/14/2021    ALT 32 (H) 08/14/2021   PROT 6.9 08/14/2021   ALBUMIN 2.5 (L) 08/04/2017   CALCIUM 8.8 08/14/2021   GFRAA 132 06/16/2020   QFTBGOLD Negative 07/07/2016   QFTBGOLDPLUS NEGATIVE 11/28/2018    Speciality Comments: PPD skin test: 02/01/2020 negative.   Prior therapy: Enbrel, Cimzia, Remicade, Plaquenil, and MTX monotherapy  Procedures:  No procedures performed Allergies: Effexor [venlafaxine]   Assessment / Plan:     Visit Diagnoses: No diagnosis found.  Orders: No orders of the defined types were placed in this encounter.  No orders of the defined types were placed in this encounter.   Face-to-face time spent with patient was *** minutes. Greater than 50% of time was spent in counseling and coordination of care.  Follow-Up Instructions: No follow-ups on file.   Ellen Henri, CMA  Note - This record has been created using Animal nutritionist.  Chart creation errors have been sought, but may not always  have been located. Such creation errors do not reflect on  the standard of medical care.

## 2021-12-15 ENCOUNTER — Ambulatory Visit: Payer: BC Managed Care – PPO | Attending: Physician Assistant | Admitting: Physician Assistant

## 2021-12-15 ENCOUNTER — Telehealth: Payer: Self-pay | Admitting: Pharmacist

## 2021-12-15 ENCOUNTER — Encounter: Payer: Self-pay | Admitting: Physician Assistant

## 2021-12-15 VITALS — BP 126/84 | HR 93 | Resp 17 | Ht 67.0 in | Wt 303.2 lb

## 2021-12-15 DIAGNOSIS — M0579 Rheumatoid arthritis with rheumatoid factor of multiple sites without organ or systems involvement: Secondary | ICD-10-CM | POA: Diagnosis not present

## 2021-12-15 DIAGNOSIS — F32A Depression, unspecified: Secondary | ICD-10-CM

## 2021-12-15 DIAGNOSIS — F331 Major depressive disorder, recurrent, moderate: Secondary | ICD-10-CM | POA: Diagnosis not present

## 2021-12-15 DIAGNOSIS — E7800 Pure hypercholesterolemia, unspecified: Secondary | ICD-10-CM

## 2021-12-15 DIAGNOSIS — Z8639 Personal history of other endocrine, nutritional and metabolic disease: Secondary | ICD-10-CM | POA: Diagnosis not present

## 2021-12-15 DIAGNOSIS — Z111 Encounter for screening for respiratory tuberculosis: Secondary | ICD-10-CM

## 2021-12-15 DIAGNOSIS — Z87891 Personal history of nicotine dependence: Secondary | ICD-10-CM | POA: Diagnosis not present

## 2021-12-15 DIAGNOSIS — F4312 Post-traumatic stress disorder, chronic: Secondary | ICD-10-CM | POA: Diagnosis not present

## 2021-12-15 DIAGNOSIS — F909 Attention-deficit hyperactivity disorder, unspecified type: Secondary | ICD-10-CM | POA: Diagnosis not present

## 2021-12-15 DIAGNOSIS — Z79899 Other long term (current) drug therapy: Secondary | ICD-10-CM | POA: Diagnosis not present

## 2021-12-15 DIAGNOSIS — F419 Anxiety disorder, unspecified: Secondary | ICD-10-CM

## 2021-12-15 DIAGNOSIS — B002 Herpesviral gingivostomatitis and pharyngotonsillitis: Secondary | ICD-10-CM | POA: Diagnosis not present

## 2021-12-15 NOTE — Patient Instructions (Addendum)
Standing Labs We placed an order today for your standing lab work.   Please have your standing labs drawn in   If possible, please have your labs drawn 2 weeks prior to your appointment so that the provider can discuss your results at your appointment.  Please note that you may see your imaging and lab results in MyChart before we have reviewed them. We may be awaiting multiple results to interpret others before contacting you. Please allow our office up to 72 hours to thoroughly review all of the results before contacting the office for clarification of your results.  We currently have open lab daily: Monday through Thursday from 1:30 PM-4:30 PM and Friday from 1:30 PM- 4:00 PM If possible, please come for your lab work on Monday, Thursday or Friday afternoons, as you may experience shorter wait times.   Effective February 03, 2022 the new lab hours will change to: Monday through Thursday from 1:30 PM-5:00 PM and Friday from 8:30 AM-12:00 PM If possible, please come for your lab work on Monday and Thursday afternoons, as you may experience shorter wait times.  Please be advised, all patients with office appointments requiring lab work will take precedent over walk-in lab work.    The office is located at [address], [address], [place], [place] [address] No appointment is necessary.   Labs are drawn by [facility]. Please bring your co-pay at the time of your lab draw.  You may receive a bill from [facility] for your lab work.  Please note if you are on Hydroxychloroquine and and an order has been placed for a Hydroxychloroquine level, you will need to have it drawn 4 hours or more after your last dose.  If you wish to have your labs drawn at another location, please call the office 24 hours in advance to send orders.  If you have any questions regarding directions or hours of operation,  please call [phone].   As a reminder, please drink plenty of water prior to coming for your lab work.  Thanks!  If you have signs or symptoms of an infection or start antibiotics: First, call your PCP for workup of your infection. Hold your medication through the infection, until you complete your antibiotics, and until symptoms resolve if you take the following: Injectable medication (Actemra, Benlysta, Cimzia, Cosentyx, Enbrel, Humira, Kevzara, Orencia, Remicade, Simponi, Stelara, Taltz, Tremfya) Methotrexate Leflunomide (Arava) Mycophenolate (Cellcept) [name], Olumiant, or Rinvoq  Vaccines You are taking a medication(s) that can suppress your immune system.  The following immunizations are recommended: Flu annually Covid-19  Td/Tdap (tetanus, diphtheria, pertussis) every 10 years Pneumonia (Prevnar 15 then Pneumovax 23 at least 1 year apart.  Alternatively, can take Prevnar 20 without needing additional dose) Shingrix: 2 doses from 4 weeks to 6 months apart  Please check with your PCP to make sure you are up to date.   Because you are taking [name], Rinvoq, or Olumiant, it is very important to know that this class of medications has a FDA BLACK BOX WARNING for major adverse cardiovascular events (MACE), thrombosis, mortality (including sudden cardiovascular death), serious infections, and lymphomas. MACE is defined as cardiovascular death, myocardial infarction, and stroke. Thrombosis includes deep venous thrombosis (DVT), pulmonary embolism (PE), and arterial thrombosis. If you are a current or former smoker, you are at higher risk for MACE.

## 2021-12-15 NOTE — Telephone Encounter (Signed)
PA for Rinvoq submitted with clinicals attached from today  Key: [id number]  Chesley Mires, PharmD, MPH, BCPS, CPP Clinical Pharmacist (Rheumatology and Pulmonology)

## 2021-12-15 NOTE — Telephone Encounter (Signed)
SAVED a Prior Authorization request to [facility] for RINVOQ via CoverMyMeds. Will update once we receive a response. Pending OV note to be signed from today prior to submission of PA w clinicals  Key: [id number]  Chesley Mires, PharmD, MPH, BCPS, CPP Clinical Pharmacist (Rheumatology and Pulmonology)

## 2021-12-15 NOTE — Telephone Encounter (Signed)
Patient had OV today and states she had change in insurance about 2 weeks ago.  Please run Rinvoq authorization (urgent if possible) through new insurance (scan of new card is in media tab of pt's chart)  Chesley Mires, PharmD, MPH, BCPS, CPP Clinical Pharmacist (Rheumatology and Pulmonology)

## 2021-12-16 ENCOUNTER — Other Ambulatory Visit (HOSPITAL_COMMUNITY): Payer: Self-pay

## 2021-12-16 DIAGNOSIS — F4312 Post-traumatic stress disorder, chronic: Secondary | ICD-10-CM | POA: Diagnosis not present

## 2021-12-16 DIAGNOSIS — F331 Major depressive disorder, recurrent, moderate: Secondary | ICD-10-CM | POA: Diagnosis not present

## 2021-12-16 DIAGNOSIS — F909 Attention-deficit hyperactivity disorder, unspecified type: Secondary | ICD-10-CM | POA: Diagnosis not present

## 2021-12-16 NOTE — Progress Notes (Signed)
CBC and CMP WNL

## 2021-12-16 NOTE — Telephone Encounter (Signed)
Per automated response, prior authorization for Rinvoq already previously approved (see encounter from [employer] on 11/12/21). Test claim was not run at that time.  Per test claim, Medicaid requires prior authorization as well even as secondary (?). Submitted via CMM. Automated response: Submit Bill To Other Processor Or Primary Payer  Called pharmacy to see if they could re-process Rinvoq prescription.  Per rep, they are also getting rejection for auth required. Will have to call Medicaid help desk to assist.  Phone: [phone]  Chesley Mires, PharmD, MPH, BCPS, CPP Clinical Pharmacist (Rheumatology and Pulmonology)

## 2021-12-17 ENCOUNTER — Telehealth: Payer: Self-pay | Admitting: Rheumatology

## 2021-12-17 LAB — COMPLETE METABOLIC PANEL WITH GFR
AG Ratio: 1.4 (calc) (ref 1.0–2.5)
ALT: 22 U/L (ref 6–29)
AST: 14 U/L (ref 10–30)
Albumin: 3.9 g/dL (ref 3.6–5.1)
Alkaline phosphatase (APISO): 90 U/L (ref 31–125)
BUN: 13 mg/dL (ref 7–25)
CO2: 24 mmol/L (ref 20–32)
Calcium: 8.7 mg/dL (ref 8.6–10.2)
Chloride: 106 mmol/L (ref 98–110)
Creat: 0.66 mg/dL (ref 0.50–0.97)
Globulin: 2.8 g/dL (calc) (ref 1.9–3.7)
Glucose, Bld: 90 mg/dL (ref 65–99)
Potassium: 4.2 mmol/L (ref 3.5–5.3)
Sodium: 138 mmol/L (ref 135–146)
Total Bilirubin: 0.3 mg/dL (ref 0.2–1.2)
Total Protein: 6.7 g/dL (ref 6.1–8.1)
eGFR: 117 mL/min/{1.73_m2} (ref >=60)

## 2021-12-17 LAB — CBC WITH DIFFERENTIAL/PLATELET
Absolute Monocytes: 319 cells/uL (ref 200–950)
Basophils Absolute: 20 cells/uL (ref 0–200)
Basophils Relative: 0.3 %
Eosinophils Absolute: 143 cells/uL (ref 15–500)
Eosinophils Relative: 2.2 %
HCT: 39.1 % (ref 35.0–45.0)
Hemoglobin: 13.1 g/dL (ref 11.7–15.5)
Lymphs Abs: 1573 cells/uL (ref 850–3900)
MCH: 28.5 pg (ref 27.0–33.0)
MCHC: 33.5 g/dL (ref 32.0–36.0)
MCV: 85.2 fL (ref 80.0–100.0)
MPV: 9.3 fL (ref 7.5–12.5)
Monocytes Relative: 4.9 %
Neutro Abs: 4446 cells/uL (ref 1500–7800)
Neutrophils Relative %: 68.4 %
Platelets: 290 10*3/uL (ref 140–400)
RBC: 4.59 10*6/uL (ref 3.80–5.10)
RDW: 14 % (ref 11.0–15.0)
Total Lymphocyte: 24.2 %
WBC: 6.5 10*3/uL (ref 3.8–10.8)

## 2021-12-17 LAB — QUANTIFERON-TB GOLD PLUS
Mitogen-NIL: >10 IU/mL
NIL: 0.03 IU/mL
QuantiFERON-TB Gold Plus: NEGATIVE
TB1-NIL: 0.01 IU/mL
TB2-NIL: 0.02 IU/mL

## 2021-12-17 NOTE — Telephone Encounter (Unsigned)
Patient called stating she was returning Devki's call regarding PA for Rinvoq medication.

## 2021-12-18 NOTE — Progress Notes (Signed)
TB gold negative

## 2021-12-21 ENCOUNTER — Other Ambulatory Visit (HOSPITAL_COMMUNITY): Payer: Self-pay

## 2021-12-21 NOTE — Telephone Encounter (Addendum)
Called Medicaid regarding patient's Rinvoq authorization.  Received notification from  [facility]  regarding a prior authorization for [facility]. Authorization has been APPROVED from 12/21/21 to 12/21/22.  Per rep, prior authorization is still required even if Medicaid is being run as secondary coverage.  Patient can continue to fill through [place]: [phone]   Ref # [id number] Phone: [place] to assist with reprocessing Rinvoq claim. Rep able to successfully reprocess Rinvoq rx. Copay is $4 after Medicaid picks up as secondary coverage. ATC patient to review. Unable to reach. Left detailed VM and advised her to call pharmacy  Knox Saliva, PharmD, MPH, BCPS, CPP Clinical Pharmacist (Rheumatology and Pulmonology)

## 2021-12-21 NOTE — Telephone Encounter (Signed)
[facility] to assist with reprocessing Rinvoq claim. Rep able to successfully reprocess Rinvoq rx. Copay is $4 after Medicaid picks up as secondary coverage. ATC patient to review. Unable to reach. Left detailed VM and advised her to call pharmacy  Knox Saliva, PharmD, MPH, BCPS, CPP Clinical Pharmacist (Rheumatology and Pulmonology)

## 2022-01-11 ENCOUNTER — Other Ambulatory Visit: Payer: Self-pay | Admitting: Physician Assistant

## 2022-01-11 DIAGNOSIS — M0579 Rheumatoid arthritis with rheumatoid factor of multiple sites without organ or systems involvement: Secondary | ICD-10-CM

## 2022-01-11 NOTE — Telephone Encounter (Signed)
Next Visit: 03/18/2022  Last Visit: 12/15/2021  Last Fill: 08/14/2021  DX: Rheumatoid arthritis involving multiple sites with positive rheumatoid factor  Current Dose per office note 12/15/2021: Rinvoq 15 mg 1 tablet by mouth daily  Labs: 12/15/2021 CBC and CMP WNL  TB Gold: 12/15/2021 Neg    Okay to refill Rinvoq?

## 2022-01-12 DIAGNOSIS — F331 Major depressive disorder, recurrent, moderate: Secondary | ICD-10-CM | POA: Diagnosis not present

## 2022-01-12 DIAGNOSIS — F4312 Post-traumatic stress disorder, chronic: Secondary | ICD-10-CM | POA: Diagnosis not present

## 2022-01-12 DIAGNOSIS — F909 Attention-deficit hyperactivity disorder, unspecified type: Secondary | ICD-10-CM | POA: Diagnosis not present

## 2022-03-05 NOTE — Progress Notes (Deleted)
Office Visit Note  Patient: Lori Valentine             Date of Birth: 1985-08-23           MRN: [id number]             PCP: Sabino Dick, DO Referring: Sabino Dick, DO Visit Date: 03/18/2022 Occupation: @GUAROCC @  Subjective:  No chief complaint on file.   History of Present Illness: Lori Valentine is a 36 y.o. female ***   Activities of Daily Living:  Patient reports morning stiffness for *** {minute/hour:19697}.   Patient {ACTIONS;DENIES/REPORTS:21021675::"Denies"} nocturnal pain.  Difficulty dressing/grooming: {ACTIONS;DENIES/REPORTS:21021675::"Denies"} Difficulty climbing stairs: {ACTIONS;DENIES/REPORTS:21021675::"Denies"} Difficulty getting out of chair: {ACTIONS;DENIES/REPORTS:21021675::"Denies"} Difficulty using hands for taps, buttons, cutlery, and/or writing: {ACTIONS;DENIES/REPORTS:21021675::"Denies"}  No Rheumatology ROS completed.   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   History of anxiety and depression 10/22/2016   Smoker 10/22/2016   Rheumatoid arthritis (HCC) 05/13/2015   Hypothyroidism 07/29/2014    Past Medical History:  Diagnosis Date   Hypothyroid    Rheumatoid arthritis (HCC)    Twin gestation, 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        Multiple Gestation       MC/DA - O30.039  Concordant (< 20%), nml AFV, AGA    Discordant (>20%)**  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              DC/DA - O30.049  Concordant (<20%), nml AFV, AGA, no other comorbidities  Discordant (> 20%)**    O30.003 is extra code for discordant growth   Q 3 wks 16-32, Q 2 wks 32-delivery Q 1  wk, Fluid alternating w/ growth   20-24-28-32-36 Q 2-3 wks     Family History  Problem Relation Age of Onset   Schizophrenia Mother    Bipolar disorder Mother    Diabetes Other    Heart disease Other    Diabetes Sister    Healthy Son    Past Surgical History:  Procedure Laterality Date   CESAREAN SECTION N/A 08/04/2017   Procedure: CESAREAN SECTION;  Surgeon: 10/04/2017, MD;  Location: [facility]IRTHING SU[facility];  Service: Obstetrics;  Laterality: N/A;   TUBAL LIGATION Bilateral 08/04/2017   Procedure: BILATERAL TUBAL LIGATION;  Surgeon: 10/04/2017, MD;  Location: [facility]TH[facility] SU[facility];  Service: Obstetrics;  Laterality: Bilateral;   Social History   Social History Narrative   64 year old son   Lives with  Sister and nephew and son.   Works as 10 at [employer] History  Administered Date(s) Administered   Influenza,inj,Quad PF,6+ Mos 05/13/2015, 12/29/2016   Influenza-Unspecified 01/12/2018   Moderna Sars-Covid-2 Vaccination 05/28/2019, 07/03/2019   Tdap 05/13/2015, 06/15/2017     Objective: Vital Signs: There were no vitals taken for this visit.   Physical Exam   Musculoskeletal Exam: ***  CDAI Exam: CDAI Score: -- Patient Global: --; Provider Global: -- Swollen: --; Tender: -- Joint Exam 03/18/2022   No joint exam has been documented for this visit   There is currently no information documented on the homunculus. Go to the Rheumatology activity and complete the homunculus joint exam.  Investigation: No  additional findings.  Imaging: No results found.  Recent Labs: Lab Results  Component Value Date   WBC 6.5 12/15/2021   HGB 13.1 12/15/2021   PLT 290 12/15/2021   NA 138 12/15/2021   K 4.2 12/15/2021   CL 106 12/15/2021   CO2 24 12/15/2021   GLUCOSE 90 12/15/2021   BUN 13 12/15/2021   CREATININE 0.66 12/15/2021   BILITOT 0.3 12/15/2021   ALKPHOS 253 (H) 08/04/2017   AST 14 12/15/2021   ALT 22 12/15/2021   PROT  6.7 12/15/2021   ALBUMIN 2.5 (L) 08/04/2017   CALCIUM 8.7 12/15/2021   GFRAA 132 06/16/2020   QFTBGOLD Negative 07/07/2016   QFTBGOLDPLUS NEGATIVE 12/15/2021    Speciality Comments: PPD skin test: 02/01/2020 negative.   Prior therapy: Enbrel, Cimzia, Remicade, Plaquenil, and MTX monotherapy  Procedures:  No procedures performed Allergies: Effexor [venlafaxine]   Assessment / Plan:     Visit Diagnoses: No diagnosis found.  Orders: No orders of the defined types were placed in this encounter.  No orders of the defined types were placed in this encounter.   Face-to-face time spent with patient was *** minutes. Greater than 50% of time was spent in counseling and coordination of care.  Follow-Up Instructions: No follow-ups on file.   Ellen Henri, CMA  Note - This record has been created using Animal nutritionist.  Chart creation errors have been sought, but may not always  have been located. Such creation errors do not reflect on  the standard of medical care.

## 2022-03-09 DIAGNOSIS — F4312 Post-traumatic stress disorder, chronic: Secondary | ICD-10-CM | POA: Diagnosis not present

## 2022-03-09 DIAGNOSIS — F331 Major depressive disorder, recurrent, moderate: Secondary | ICD-10-CM | POA: Diagnosis not present

## 2022-03-09 DIAGNOSIS — F909 Attention-deficit hyperactivity disorder, unspecified type: Secondary | ICD-10-CM | POA: Diagnosis not present

## 2022-03-18 ENCOUNTER — Ambulatory Visit: Payer: BC Managed Care – PPO | Admitting: Physician Assistant

## 2022-03-18 DIAGNOSIS — Z79899 Other long term (current) drug therapy: Secondary | ICD-10-CM

## 2022-03-18 DIAGNOSIS — M0579 Rheumatoid arthritis with rheumatoid factor of multiple sites without organ or systems involvement: Secondary | ICD-10-CM

## 2022-03-18 DIAGNOSIS — Z8639 Personal history of other endocrine, nutritional and metabolic disease: Secondary | ICD-10-CM

## 2022-03-18 DIAGNOSIS — Z87891 Personal history of nicotine dependence: Secondary | ICD-10-CM

## 2022-03-18 DIAGNOSIS — E7800 Pure hypercholesterolemia, unspecified: Secondary | ICD-10-CM

## 2022-03-18 DIAGNOSIS — B002 Herpesviral gingivostomatitis and pharyngotonsillitis: Secondary | ICD-10-CM

## 2022-03-18 DIAGNOSIS — F419 Anxiety disorder, unspecified: Secondary | ICD-10-CM

## 2022-03-23 ENCOUNTER — Telehealth: Payer: Self-pay | Admitting: Pharmacist

## 2022-03-23 NOTE — Telephone Encounter (Signed)
PA for Rinvoq received from [facility] It was already previpously approved from 11/12/21 through 11/11/2022  [facility] Key: [id number]   Chesley Mires, PharmD, MPH, BCPS, CPP Clinical Pharmacist (Rheumatology and Pulmonology)

## 2022-03-24 NOTE — Telephone Encounter (Signed)
PA on CMM cancelled likely due to auth already being active through 11/11/2022.  Chesley Mires, PharmD, MPH, BCPS, CPP Clinical Pharmacist (Rheumatology and Pulmonology)

## 2022-04-09 ENCOUNTER — Other Ambulatory Visit (HOSPITAL_COMMUNITY): Payer: Self-pay

## 2022-04-09 NOTE — Progress Notes (Signed)
Office Visit Note  Patient: Lori Valentine             Date of Birth: 1985-04-13           MRN: [id number]             PCP: Sharion Settler, DO Referring: Sharion Settler, DO Visit Date: 04/14/2022 Occupation: @GUAROCC @  Subjective:  No chief complaint on file.   History of Present Illness: Lori Valentine is a 37 y.o. female ***     Activities of Daily Living:  Patient reports morning stiffness for *** {minute/hour:19697}.   Patient {ACTIONS;DENIES/REPORTS:21021675::"Denies"} nocturnal pain.  Difficulty dressing/grooming: {ACTIONS;DENIES/REPORTS:21021675::"Denies"} Difficulty climbing stairs: {ACTIONS;DENIES/REPORTS:21021675::"Denies"} Difficulty getting out of chair: {ACTIONS;DENIES/REPORTS:21021675::"Denies"} Difficulty using hands for taps, buttons, cutlery, and/or writing: {ACTIONS;DENIES/REPORTS:21021675::"Denies"}  No Rheumatology ROS completed.   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   History of anxiety and depression 10/22/2016   Smoker 10/22/2016   Rheumatoid arthritis ([place]) 05/13/2015   Hypothyroidism 07/29/2014    Past Medical History:  Diagnosis Date   Hypothyroid    Rheumatoid arthritis ([place])    Twin gestation, 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        Multiple Gestation       MC/DA - O30.039  Concordant (< 20%), nml AFV, AGA    Discordant (>20%)**  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              DC/DA - O30.049  Concordant (<20%), nml AFV, AGA, no other comorbidities  Discordant (> 20%)**    O30.003 is extra code for discordant growth   Q 3 wks 16-32, Q 2 wks 32-delivery Q  1 wk, Fluid alternating w/ growth   20-24-28-32-36 Q 2-3 wks     Family History  Problem Relation Age of Onset   Schizophrenia Mother    Bipolar disorder Mother    Diabetes Other    Heart disease Other    Diabetes Sister    Healthy Son    Past Surgical History:  Procedure Laterality Date   CESAREAN SECTION N/A 08/04/2017   Procedure: CESAREAN SECTION;  Surgeon: Chancy Milroy, MD;  Location: [facility];  Service: Obstetrics;  Laterality: N/A;   TUBAL LIGATION Bilateral 08/04/2017   Procedure: BILATERAL TUBAL LIGATION;  Surgeon: Chancy Milroy, MD;  Location: [facility];  Service: Obstetrics;  Laterality: Bilateral;   Social History   Social History Narrative   55 year old son   Lives with  Sister and nephew and son.   Works as Art therapist at [employer] History  Administered Date(s) Administered   Influenza,inj,Quad PF,6+ Mos 05/13/2015, 12/29/2016   Influenza-Unspecified 01/12/2018   Moderna Sars-Covid-2 Vaccination 05/28/2019, 07/03/2019   Tdap 05/13/2015, 06/15/2017     Objective: Vital Signs: There were no vitals taken for this visit.   Physical Exam   Musculoskeletal Exam: ***  CDAI Exam: CDAI Score: -- Patient Global: --; Provider Global: -- Swollen: --; Tender: -- Joint Exam 04/14/2022   No joint exam has been documented for this visit   There is currently no information documented on the homunculus. Go to the Rheumatology activity and complete the homunculus joint exam.  Investigation: No additional findings.  Imaging: No results found.  Recent Labs: Lab Results  Component Value Date   WBC 6.5 12/15/2021   HGB 13.1 12/15/2021   PLT 290 12/15/2021   NA 138 12/15/2021   K 4.2 12/15/2021   CL 106 12/15/2021   CO2 24 12/15/2021   GLUCOSE 90 12/15/2021   BUN 13 12/15/2021   CREATININE 0.66 12/15/2021   BILITOT 0.3 12/15/2021   ALKPHOS 253 (H) 08/04/2017   AST 14 12/15/2021   ALT 22 12/15/2021   PROT  6.7 12/15/2021   ALBUMIN 2.5 (L) 08/04/2017   CALCIUM 8.7 12/15/2021   GFRAA 132 06/16/2020   QFTBGOLD Negative 07/07/2016   QFTBGOLDPLUS NEGATIVE 12/15/2021    Speciality Comments: PPD skin test: 02/01/2020 negative.   Prior therapy: Enbrel, Cimzia, Remicade, Plaquenil, and MTX monotherapy  Procedures:  No procedures performed Allergies: Effexor [venlafaxine]   Assessment / Plan:     Visit Diagnoses: Rheumatoid arthritis involving multiple sites with positive rheumatoid factor (HCC)  High risk medication use  Elevated LDL cholesterol level  Recurrent oral herpes simplex  Anxiety and depression  History of hypothyroidism  Orders: No orders of the defined types were placed in this encounter.  No orders of the defined types were placed in this encounter.   Face-to-face time spent with patient was *** minutes. Greater than 50% of time was spent in counseling and coordination of care.  Follow-Up Instructions: No follow-ups on file.   Ofilia Neas, PA-C  Note - This record has been created using Dragon software.  Chart creation errors have been sought, but may not always  have been located. Such creation errors do not reflect on  the standard of medical care.

## 2022-04-14 ENCOUNTER — Encounter: Payer: Self-pay | Admitting: Physician Assistant

## 2022-04-14 ENCOUNTER — Ambulatory Visit: Payer: BC Managed Care – PPO | Attending: Physician Assistant | Admitting: Physician Assistant

## 2022-04-14 VITALS — BP 118/80 | HR 96 | Resp 17 | Ht 67.0 in | Wt 302.2 lb

## 2022-04-14 DIAGNOSIS — M0579 Rheumatoid arthritis with rheumatoid factor of multiple sites without organ or systems involvement: Secondary | ICD-10-CM | POA: Diagnosis not present

## 2022-04-14 DIAGNOSIS — Z79899 Other long term (current) drug therapy: Secondary | ICD-10-CM | POA: Diagnosis not present

## 2022-04-14 DIAGNOSIS — E7800 Pure hypercholesterolemia, unspecified: Secondary | ICD-10-CM

## 2022-04-14 DIAGNOSIS — F4312 Post-traumatic stress disorder, chronic: Secondary | ICD-10-CM | POA: Diagnosis not present

## 2022-04-14 DIAGNOSIS — B002 Herpesviral gingivostomatitis and pharyngotonsillitis: Secondary | ICD-10-CM | POA: Diagnosis not present

## 2022-04-14 DIAGNOSIS — F32A Depression, unspecified: Secondary | ICD-10-CM

## 2022-04-14 DIAGNOSIS — F419 Anxiety disorder, unspecified: Secondary | ICD-10-CM

## 2022-04-14 DIAGNOSIS — Z8639 Personal history of other endocrine, nutritional and metabolic disease: Secondary | ICD-10-CM

## 2022-04-14 DIAGNOSIS — F331 Major depressive disorder, recurrent, moderate: Secondary | ICD-10-CM | POA: Diagnosis not present

## 2022-04-14 DIAGNOSIS — F909 Attention-deficit hyperactivity disorder, unspecified type: Secondary | ICD-10-CM | POA: Diagnosis not present

## 2022-04-14 NOTE — Patient Instructions (Signed)
Standing Labs We placed an order today for your standing lab work.   Please have your standing labs drawn in April and every 3 months   Please have your labs drawn 2 weeks prior to your appointment so that the provider can discuss your lab results at your appointment.  Please note that you may see your imaging and lab results in MyChart before we have reviewed them. We will contact you once all results are reviewed. Please allow our office up to 72 hours to thoroughly review all of the results before contacting the office for clarification of your results.  Lab hours are:   Monday through Thursday from 8:00 am -12:30 pm and 1:00 pm-5:00 pm and Friday from 8:00 am-12:00 pm.  Please be advised, all patients with office appointments requiring lab work will take precedent over walk-in lab work.   Labs are drawn by [facility]. Please bring your co-pay at the time of your lab draw.  You may receive a bill from [facility] for your lab work.  Please note if you are on Hydroxychloroquine and and an order has been placed for a Hydroxychloroquine level, you will need to have it drawn 4 hours or more after your last dose.  If you wish to have your labs drawn at another location, please call the office 24 hours in advance so we can fax the orders.  The office is located at [address], [address], [place], [place] [address] No appointment is necessary.    If you have any questions regarding directions or hours of operation,  please call [phone].   As a reminder, please drink plenty of water prior to coming for your lab work. Thanks!  

## 2022-04-14 NOTE — Progress Notes (Signed)
CBC WNL

## 2022-04-15 ENCOUNTER — Telehealth: Payer: Self-pay | Admitting: Family Medicine

## 2022-04-15 LAB — CBC WITH DIFFERENTIAL/PLATELET
Absolute Monocytes: 273 cells/uL (ref 200–950)
Basophils Absolute: 9 cells/uL (ref 0–200)
Basophils Relative: 0.2 %
Eosinophils Absolute: 71 cells/uL (ref 15–500)
Eosinophils Relative: 1.5 %
HCT: 38.6 % (ref 35.0–45.0)
Hemoglobin: 12.8 g/dL (ref 11.7–15.5)
Lymphs Abs: 1603 cells/uL (ref 850–3900)
MCH: 27.8 pg (ref 27.0–33.0)
MCHC: 33.2 g/dL (ref 32.0–36.0)
MCV: 83.9 fL (ref 80.0–100.0)
MPV: 9.2 fL (ref 7.5–12.5)
Monocytes Relative: 5.8 %
Neutro Abs: 2745 cells/uL (ref 1500–7800)
Neutrophils Relative %: 58.4 %
Platelets: 272 10*3/uL (ref 140–400)
RBC: 4.6 10*6/uL (ref 3.80–5.10)
RDW: 14.3 % (ref 11.0–15.0)
Total Lymphocyte: 34.1 %
WBC: 4.7 10*3/uL (ref 3.8–10.8)

## 2022-04-15 LAB — COMPLETE METABOLIC PANEL WITH GFR
AG Ratio: 1.4 (calc) (ref 1.0–2.5)
ALT: 24 U/L (ref 6–29)
AST: 16 U/L (ref 10–30)
Albumin: 4.2 g/dL (ref 3.6–5.1)
Alkaline phosphatase (APISO): 88 U/L (ref 31–125)
BUN: 9 mg/dL (ref 7–25)
CO2: 25 mmol/L (ref 20–32)
Calcium: 9 mg/dL (ref 8.6–10.2)
Chloride: 105 mmol/L (ref 98–110)
Creat: 0.71 mg/dL (ref 0.50–0.97)
Globulin: 3.1 g/dL (calc) (ref 1.9–3.7)
Glucose, Bld: 90 mg/dL (ref 65–99)
Potassium: 4.5 mmol/L (ref 3.5–5.3)
Sodium: 138 mmol/L (ref 135–146)
Total Bilirubin: 0.3 mg/dL (ref 0.2–1.2)
Total Protein: 7.3 g/dL (ref 6.1–8.1)
eGFR: 114 mL/min/{1.73_m2} (ref >=60)

## 2022-04-15 LAB — LIPID PANEL
Cholesterol: 208 mg/dL — ABNORMAL HIGH (ref <200)
HDL: 38 mg/dL — ABNORMAL LOW (ref >=50)
LDL Cholesterol (Calc): 138 mg/dL (calc) — ABNORMAL HIGH
Non-HDL Cholesterol (Calc): 170 mg/dL (calc) — ABNORMAL HIGH (ref <130)
Total CHOL/HDL Ratio: 5.5 (calc) — ABNORMAL HIGH (ref <5.0)
Triglycerides: 182 mg/dL — ABNORMAL HIGH (ref <150)

## 2022-04-15 NOTE — Progress Notes (Signed)
CMP WNL.  Total cholesterol is elevated 208. Triglycerides and LDL remain elevated and HDL is borderline elevated.  Please notify the patient and recommend dietary changes.  Please also forward results to PCP to review as requested.

## 2022-04-15 NOTE — Telephone Encounter (Signed)
Received patients lab results from her recent Rheumatology visit. Pt has not been seen here at the [facility] clinic since 2019. Called patient to see if she would like to re-establish care. She did not answer phone- left HIPAA compliant voicemail noting that we would happily see her if she would like to re-establish with [place] and to also let us know if she has found another primary care provider elsewhere.

## 2022-04-27 ENCOUNTER — Other Ambulatory Visit: Payer: Self-pay | Admitting: *Deleted

## 2022-04-27 ENCOUNTER — Encounter: Payer: Self-pay | Admitting: *Deleted

## 2022-04-27 DIAGNOSIS — M0579 Rheumatoid arthritis with rheumatoid factor of multiple sites without organ or systems involvement: Secondary | ICD-10-CM

## 2022-04-27 MED ORDER — RINVOQ 15 MG PO TB24: 1.0000 | ORAL_TABLET | Freq: Every day | ORAL | 0 refills | Status: DC

## 2022-04-27 NOTE — Telephone Encounter (Signed)
From: [name] To: Office of Ofilia Neas, [place] Sent: 04/27/2022 10:52 AM EST Subject: Medication Renewal Request  Refills have been requested for the following medications:   RINVOQ 15 MG TB24 Lori Valentine]  Preferred pharmacy: [facility], [address] Delivery method: Mail

## 2022-04-27 NOTE — Telephone Encounter (Signed)
Next Visit: 07/23/2022  Last Visit: 04/14/2022  Last Fill: 01/11/2022  HT:[id number] arthritis involving multiple sites with positive rheumatoid factor   Current Dose per office note 04/14/2022: Rinvoq 15 mg 1 tablet by mouth daily.   Labs: 04/14/2022 CBC WNL CMP WNL.   TB Gold: 12/15/2021 Neg    Okay to refill Rinvoq?

## 2022-05-12 DIAGNOSIS — F331 Major depressive disorder, recurrent, moderate: Secondary | ICD-10-CM | POA: Diagnosis not present

## 2022-05-12 DIAGNOSIS — F4312 Post-traumatic stress disorder, chronic: Secondary | ICD-10-CM | POA: Diagnosis not present

## 2022-05-12 DIAGNOSIS — F909 Attention-deficit hyperactivity disorder, unspecified type: Secondary | ICD-10-CM | POA: Diagnosis not present

## 2022-05-18 DIAGNOSIS — F909 Attention-deficit hyperactivity disorder, unspecified type: Secondary | ICD-10-CM | POA: Diagnosis not present

## 2022-05-18 DIAGNOSIS — F332 Major depressive disorder, recurrent severe without psychotic features: Secondary | ICD-10-CM | POA: Diagnosis not present

## 2022-05-18 DIAGNOSIS — F331 Major depressive disorder, recurrent, moderate: Secondary | ICD-10-CM | POA: Diagnosis not present

## 2022-05-18 DIAGNOSIS — F4312 Post-traumatic stress disorder, chronic: Secondary | ICD-10-CM | POA: Diagnosis not present

## 2022-07-02 NOTE — Progress Notes (Deleted)
Office Visit Note  Patient: Lori Valentine             Date of Birth: Jan 02, 1986           MRN: [id number]             PCP: Sharion Settler, DO Referring: Sharion Settler, DO Visit Date: 07/16/2022 Occupation: @GUAROCC @  Subjective:  No chief complaint on file.   History of Present Illness: Lori Valentine is a 37 y.o. female ***     Activities of Daily Living:  Patient reports morning stiffness for *** {minute/hour:19697}.   Patient {ACTIONS;DENIES/REPORTS:21021675::"Denies"} nocturnal pain.  Difficulty dressing/grooming: {ACTIONS;DENIES/REPORTS:21021675::"Denies"} Difficulty climbing stairs: {ACTIONS;DENIES/REPORTS:21021675::"Denies"} Difficulty getting out of chair: {ACTIONS;DENIES/REPORTS:21021675::"Denies"} Difficulty using hands for taps, buttons, cutlery, and/or writing: {ACTIONS;DENIES/REPORTS:21021675::"Denies"}  No Rheumatology ROS completed.   PMFS History:  Patient Active Problem List   Diagnosis Date Noted  . Difficulty concentrating 02/03/2018  . Anemia 09/08/2017  . Normal labor 08/04/2017  . Severe obesity (BMI >= 40) ([facility]) 07/28/2017  . Obesity in pregnancy, antepartum 03/11/2017  . Maternal rheumatoid arthritis complicating pregnancy ([place]) 03/11/2017  . Supervision of high-risk pregnancy 02/08/2017  . 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 History of anxiety and depression 10/22/2016  . Smoker 10/22/2016  . Rheumatoid arthritis ([place]) 05/13/2015  . Hypothyroidism 07/29/2014    Past Medical History:  Diagnosis Date  . Hypothyroid   . Rheumatoid arthritis ([place])   . 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 Schizophrenia Mother   . Bipolar disorder Mother   . Diabetes Other   . Heart disease Other   . Diabetes Sister   . Healthy Son    Past Surgical History:  Procedure Laterality Date  . CESAREAN SECTION N/A 08/04/2017   Procedure: CESAREAN SECTION;  Surgeon: Chancy Milroy, MD;  Location: [facility];  Service: Obstetrics;  Laterality: N/A;  . TUBAL LIGATION Bilateral 08/04/2017   Procedure: BILATERAL TUBAL LIGATION;  Surgeon: Chancy Milroy, MD;  Location: [facility];  Service: Obstetrics;  Laterality: Bilateral;   Social History   Social History Narrative   37 year old son   Lives with  Sister and nephew and son.   Works as Art therapist at [employer] History  Administered Date(s) Administered  . Influenza,inj,Quad PF,6+ Mos 05/13/2015, 12/29/2016  . Influenza-Unspecified 01/12/2018  . Moderna Sars-Covid-2 Vaccination 05/28/2019, 07/03/2019  . Tdap 05/13/2015, 06/15/2017     Objective: Vital Signs: There were no vitals taken for this visit.   Physical Exam   Musculoskeletal Exam: ***  CDAI Exam: CDAI Score: -- Patient Global: --; Provider Global: -- Swollen: --; Tender: -- Joint Exam 07/16/2022   No joint exam has been documented for this visit   There is currently no information documented on the homunculus. Go to the Rheumatology activity and complete the homunculus joint exam.  Investigation: No additional findings.  Imaging: No results found.  Recent Labs: Lab Results  Component Value Date   WBC 4.7 04/14/2022   HGB 12.8 04/14/2022   PLT 272 04/14/2022   NA 138 04/14/2022   K 4.5 04/14/2022   CL 105 04/14/2022   CO2 25 04/14/2022   GLUCOSE 90 04/14/2022   BUN 9 04/14/2022   CREATININE 0.71 04/14/2022   BILITOT 0.3 04/14/2022   ALKPHOS 253 (H) 08/04/2017   AST 16 04/14/2022    ALT 24 04/14/2022   PROT 7.3 04/14/2022   ALBUMIN 2.5 (L) 08/04/2017   CALCIUM 9.0 04/14/2022   GFRAA 132 06/16/2020   QFTBGOLD Negative 07/07/2016   QFTBGOLDPLUS NEGATIVE 12/15/2021    Speciality Comments: PPD skin test: 02/01/2020 negative.   Prior therapy: Enbrel, Cimzia, Remicade, Plaquenil, and MTX monotherapy  Procedures:  No procedures performed Allergies: Effexor [venlafaxine]   Assessment / Plan:     Visit Diagnoses: Rheumatoid arthritis involving multiple sites with positive rheumatoid factor  High risk medication use  Elevated LDL cholesterol level  Recurrent oral herpes simplex  Anxiety and depression  History of hypothyroidism  Former smoker  Orders: No orders of the defined types were placed in this encounter.  No orders of the defined types were placed in this encounter.   Face-to-face time spent with patient was *** minutes. Greater than 50% of time was spent in counseling and coordination of care.  Follow-Up Instructions: No follow-ups on file.   Ofilia Neas, PA-C  Note - This record has been created using Dragon software.  Chart creation errors have been sought, but may not always  have been located. Such creation errors do not reflect on  the standard of medical care.

## 2022-07-08 ENCOUNTER — Other Ambulatory Visit: Payer: Self-pay | Admitting: *Deleted

## 2022-07-08 DIAGNOSIS — M0579 Rheumatoid arthritis with rheumatoid factor of multiple sites without organ or systems involvement: Secondary | ICD-10-CM

## 2022-07-08 MED ORDER — RINVOQ 15 MG PO TB24: 1.0000 | ORAL_TABLET | Freq: Every day | ORAL | 0 refills | Status: DC

## 2022-07-08 NOTE — Addendum Note (Signed)
Addended by: Carole Binning on: 07/08/2022 02:08 PM   Modules accepted: Orders

## 2022-07-08 NOTE — Telephone Encounter (Signed)
Last Fill: 04/27/2022  Labs: 04/14/2022 CBC WNL CMP WNL. Total cholesterol is elevated 208. Triglycerides and LDL remain elevated and HDL is borderline elevated.     TB Gold: 12/15/2021   Next Visit: 07/16/2022  Last Visit: 04/14/2022  [id number] arthritis involving multiple sites with positive rheumatoid factor   Current Dose per office note 04/27/2022:  Rinvoq 15 mg 1 tablet by mouth daily.   Patient to update labs at upcoming appointment on 07/16/2022  Okay to refill Rinvoq?

## 2022-07-08 NOTE — Addendum Note (Signed)
Addended by: Carole Binning on: 07/08/2022 01:41 PM   Modules accepted: Orders

## 2022-07-12 MED ORDER — RINVOQ 15 MG PO TB24: 1.0000 | ORAL_TABLET | Freq: Every day | ORAL | 0 refills | Status: DC

## 2022-07-12 NOTE — Addendum Note (Signed)
Addended by: Henriette Combs on: 07/12/2022 01:54 PM   Modules accepted: Orders

## 2022-07-14 DIAGNOSIS — F331 Major depressive disorder, recurrent, moderate: Secondary | ICD-10-CM | POA: Diagnosis not present

## 2022-07-14 DIAGNOSIS — F909 Attention-deficit hyperactivity disorder, unspecified type: Secondary | ICD-10-CM | POA: Diagnosis not present

## 2022-07-14 DIAGNOSIS — F4312 Post-traumatic stress disorder, chronic: Secondary | ICD-10-CM | POA: Diagnosis not present

## 2022-07-16 ENCOUNTER — Ambulatory Visit: Payer: BC Managed Care – PPO | Admitting: Physician Assistant

## 2022-07-16 DIAGNOSIS — Z8639 Personal history of other endocrine, nutritional and metabolic disease: Secondary | ICD-10-CM

## 2022-07-16 DIAGNOSIS — E7800 Pure hypercholesterolemia, unspecified: Secondary | ICD-10-CM

## 2022-07-16 DIAGNOSIS — Z87891 Personal history of nicotine dependence: Secondary | ICD-10-CM

## 2022-07-16 DIAGNOSIS — F32A Depression, unspecified: Secondary | ICD-10-CM

## 2022-07-16 DIAGNOSIS — B002 Herpesviral gingivostomatitis and pharyngotonsillitis: Secondary | ICD-10-CM

## 2022-07-16 DIAGNOSIS — M0579 Rheumatoid arthritis with rheumatoid factor of multiple sites without organ or systems involvement: Secondary | ICD-10-CM

## 2022-07-16 DIAGNOSIS — Z79899 Other long term (current) drug therapy: Secondary | ICD-10-CM

## 2022-07-21 ENCOUNTER — Other Ambulatory Visit: Payer: Self-pay

## 2022-07-22 ENCOUNTER — Encounter: Payer: Self-pay | Admitting: *Deleted

## 2022-07-22 ENCOUNTER — Other Ambulatory Visit (HOSPITAL_COMMUNITY): Payer: Self-pay

## 2022-07-22 ENCOUNTER — Other Ambulatory Visit: Payer: Self-pay | Admitting: Rheumatology

## 2022-07-22 DIAGNOSIS — M0579 Rheumatoid arthritis with rheumatoid factor of multiple sites without organ or systems involvement: Secondary | ICD-10-CM

## 2022-07-22 MED ORDER — RINVOQ 15 MG PO TB24
1.0000 | ORAL_TABLET | Freq: Every day | ORAL | 0 refills | Status: DC
  Filled 2022-07-22 – 2022-07-29 (×2): qty 30, 30d supply, fill #0

## 2022-07-22 NOTE — Telephone Encounter (Signed)
Last Fill: 04/27/2022   Labs: 04/14/2022 CBC WNL CMP WNL. Total cholesterol is elevated 208. Triglycerides and LDL remain elevated and HDL is borderline elevated.   TB Gold:  12/15/2021    Next Visit: 07/16/2022   Last Visit: 04/14/2022   [id number] arthritis involving multiple sites with positive rheumatoid factor   Current Dose per office note 04/14/2022: Rinvoq 15 mg 1 tablet by mouth daily.   Sent message to patient via my chart to advise patient she is due to update labs   Okay to refill Rinvoq?

## 2022-07-22 NOTE — Telephone Encounter (Signed)
Patient called the office requesting a refill of Rinvoq to [facility]. Patient has been off of medication for 3 weeks.

## 2022-07-23 ENCOUNTER — Other Ambulatory Visit: Payer: Self-pay

## 2022-07-23 ENCOUNTER — Ambulatory Visit: Payer: BC Managed Care – PPO | Admitting: Physician Assistant

## 2022-07-23 ENCOUNTER — Other Ambulatory Visit (HOSPITAL_COMMUNITY): Payer: Self-pay

## 2022-07-24 ENCOUNTER — Other Ambulatory Visit (HOSPITAL_COMMUNITY): Payer: Self-pay

## 2022-07-26 ENCOUNTER — Other Ambulatory Visit (HOSPITAL_COMMUNITY): Payer: Self-pay

## 2022-07-26 ENCOUNTER — Other Ambulatory Visit: Payer: Self-pay

## 2022-07-29 ENCOUNTER — Other Ambulatory Visit: Payer: Self-pay

## 2022-07-29 ENCOUNTER — Other Ambulatory Visit (HOSPITAL_COMMUNITY): Payer: Self-pay

## 2022-07-30 NOTE — Progress Notes (Signed)
Office Visit Note  Patient: Lori Valentine             Date of Birth: 1986-01-09           MRN: [id number]             PCP: Sabino Dick, DO Referring: Sabino Dick, DO Visit Date: 08/13/2022 Occupation: @GUAROCC @  Subjective:  Medication monitoring   History of Present Illness: Lori Valentine is a 37 y.o. female with history of seropositive rheumatoid arthritis.  Patient remains on Rinvoq 15 mg 1 tablet by mouth daily.  Patient reports that she recently had a gap in therapy for 3 weeks but has been back on Rinvoq for about 3 weeks.  She states during the gap in therapy she had a flare involving the left shoulder which resolved with the use of ibuprofen.  She continues to have some discomfort in the left shoulder at times and has some inflammation in her hands currently.  Patient states that overall when she is able to take her invoke consistently her RA has been well controlled.  She is no longer taking methotrexate due to recurrent infections and side effects.   She denies any recent or recurrent infections.      Activities of Daily Living:  Patient reports morning stiffness for a few minutes.   Patient Reports nocturnal pain.  Difficulty dressing/grooming: Denies Difficulty climbing stairs: Denies Difficulty getting out of chair: Denies Difficulty using hands for taps, buttons, cutlery, and/or writing: Reports  Review of Systems  Constitutional:  Positive for fatigue.  HENT:  Positive for mouth dryness. Negative for mouth sores.   Eyes:  Negative for dryness.  Respiratory:  Negative for shortness of breath.   Cardiovascular:  Negative for chest pain and palpitations.  Gastrointestinal:  Negative for blood in stool, constipation and diarrhea.  Endocrine: Negative for increased urination.  Genitourinary:  Positive for nocturia. Negative for painful urination and involuntary urination.  Musculoskeletal:  Positive for joint pain, joint pain and morning stiffness.  Negative for gait problem, joint swelling, myalgias, muscle weakness, muscle tenderness and myalgias.  Skin:  Negative for color change, rash, hair loss and sensitivity to sunlight.  Allergic/Immunologic: Positive for susceptible to infections.  Neurological:  Negative for dizziness and headaches.  Hematological:  Negative for swollen glands.  Psychiatric/Behavioral:  Negative for depressed mood and sleep disturbance. The patient is not nervous/anxious.     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   History of anxiety and depression 10/22/2016   Smoker 10/22/2016   Rheumatoid arthritis ([facility]) 05/13/2015   Hypothyroidism 07/29/2014    Past Medical History:  Diagnosis Date   Hypothyroid    Rheumatoid arthritis ([facility])    Twin gestation, 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        Multiple Gestation       MC/DA - O30.039  Concordant (< 20%), nml AFV, AGA    Discordant (>20%)**  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              DC/DA - O30.049  Concordant (<20%), nml AFV, AGA, no other comorbidities  Discordant (> 20%)**    O30.003 is extra code for discordant  growth   Q 3 wks 16-32, Q 2 wks 32-delivery Q 1 wk, Fluid alternating w/ growth   20-24-28-32-36 Q 2-3 wks     Family History  Problem Relation Age of Onset   Schizophrenia Mother    Bipolar disorder Mother    Diabetes Other    Heart disease Other    Diabetes Sister    Healthy Son    Past Surgical History:  Procedure Laterality Date   CESAREAN SECTION N/A 08/04/2017   Procedure: CESAREAN SECTION;  Surgeon: Hermina Staggers, MD;  Location: [facility];   Service: Obstetrics;  Laterality: N/A;   TUBAL LIGATION Bilateral 08/04/2017   Procedure: BILATERAL TUBAL LIGATION;  Surgeon: Hermina Staggers, MD;  Location: [facility]THING SU[facility];  Service: Obstetrics;  Laterality: Bilateral;   Social History   Social History Narrative   76 year old son   Lives with  Sister and nephew and son.   Works as Sales executive at [employer] History  Administered Date(s) Administered   Influenza,inj,Quad PF,6+ Mos 05/13/2015, 12/29/2016   Influenza-Unspecified 01/12/2018   Moderna Sars-Covid-2 Vaccination 05/28/2019, 07/03/2019   Tdap 05/13/2015, 06/15/2017     Objective: Vital Signs: BP 134/85 (BP Location: Left Arm, Patient Position: Sitting, Cuff Size: Large)   Pulse 92   Resp 17   Ht 5\' 7"  (1.702 m)   Wt (!) 315 lb (142.9 kg)   BMI 49.34 kg/m    Physical Exam Vitals and nursing note reviewed.  Constitutional:      Appearance: She is well-developed.  HENT:     Head: Normocephalic and atraumatic.  Eyes:     Conjunctiva/sclera: Conjunctivae normal.  Cardiovascular:     Rate and Rhythm: Normal rate and regular rhythm.     Heart sounds: Normal heart sounds.  Pulmonary:     Effort: Pulmonary effort is normal.     Breath sounds: Normal breath sounds.  Abdominal:     General: Bowel sounds are normal.     Palpations: Abdomen is soft.  Musculoskeletal:     Cervical back: Normal range of motion.  Lymphadenopathy:     Cervical: No cervical adenopathy.  Skin:    General: Skin is warm and dry.     Capillary Refill: Capillary refill takes less than 2 seconds.  Neurological:     Mental Status: She is alert and oriented to person, place, and time.  Psychiatric:        Behavior: Behavior normal.      Musculoskeletal Exam: Patient remained seated during examination today.  C-spine has good range of motion.  Shoulder joints have good range of motion with mild discomfort in the left shoulder.  Rheumatoid nodule palpable on the  extensor surface of the right elbow.  Tenderness along the right elbow joint line.  Thickening and limited extension of both wrist joints.  She has tenderness and synovitis in the right third MCP and right second and fifth PIP joints.  Knee joints have good range of motion with no warmth or effusion.  Some synovial thickening in the right ankle noted.  No tenderness or synovitis over MTP joints currently.  CDAI Exam: CDAI Score: 8.6  Patient Global: 3 mm; Provider Global: 3 mm Swollen: 3 ; Tender: 5  Joint Exam 08/13/2022      Right  Left  Glenohumeral      Tender  Elbow   Tender     MCP 3  Swollen Tender     PIP 2  Swollen Tender     PIP 5  Swollen Tender        Investigation: No additional findings.  Imaging: No results found.  Recent Labs: Lab Results  Component Value Date   WBC 4.7 04/14/2022   HGB 12.8 04/14/2022   PLT 272 04/14/2022   NA 138 04/14/2022   K 4.5 04/14/2022   CL 105 04/14/2022   CO2 25 04/14/2022   GLUCOSE 90 04/14/2022   BUN 9 04/14/2022   CREATININE 0.71 04/14/2022   BILITOT 0.3 04/14/2022   ALKPHOS 253 (H) 08/04/2017   AST 16 04/14/2022   ALT 24 04/14/2022   PROT 7.3 04/14/2022   ALBUMIN 2.5 (L) 08/04/2017   CALCIUM 9.0 04/14/2022   GFRAA 132 06/16/2020   QFTBGOLD Negative 07/07/2016   QFTBGOLDPLUS NEGATIVE 12/15/2021    Speciality Comments: PPD skin test: 02/01/2020 negative.   Prior therapy: Enbrel, Cimzia, Remicade, Plaquenil, and MTX monotherapy  Procedures:  No procedures performed Allergies: Effexor [venlafaxine]    Assessment / Plan:     Visit Diagnoses: Rheumatoid arthritis involving multiple sites with positive rheumatoid factor ([facility]) - Prior therapy: Enbrel, Cimzia, Remicade, Plaquenil, and MTX monotherapy: Patient is currently taking Rinvoq 15 mg 1 tablet by mouth daily.  She is tolerating Rinvoq without any side effects.  She continues to find Rinvoq to be effective at managing her rheumatoid arthritis.  She recently had a  gap in therapy for 3 weeks at which time she had a flare involving the left shoulder which subsided with the use of ibuprofen.  She continues to have mild tenderness of the left shoulder, tenderness of the right elbow, and inflammation involving the right second and fifth PIP joints and the right third MCP joint.  She discontinued methotrexate due to side effects and recurrent infections.  She has not noticed any new or worsening symptoms while on Rinvoq as monotherapy.  She does not want to make any medication changes at this time.  She is overdue to update lab work but unfortunately the lab tech was not available in the office today.  She plans on having updated lab work next week.  Future orders for CBC and CMP were placed today.  A refill of her invoke will be sent to the pharmacy today.  She was advised to notify us if she continues to have recurrent flares.  She will follow-up in the office in 3 months or sooner if needed.  High risk medication use - Rinvoq 15 mg 1 tablet by mouth daily.  TB gold negative on 12/15/21.  CBC and CMP updated on 04/14/22---WNL.  Patient is due to update lab work today.  No lab tech was in the office today.  She plans on having updated lab work on Monday.  Future orders for CBC and CMP were placed today.  She will continue to require lab work every 3 months. Lipid panel updated on 04/14/22.  Patient discontinued methotrexate due to side effects and recurrent infections. No recent or recurrent infections.  Discussed the importance of holding Rinvoq if she develops signs or symptoms of an infection and to resume once the infection is completely cleared.  - Plan: COMPLETE METABOLIC PANEL WITH GFR, CBC with Differential/Platelet  Other medical conditions are listed as follows:   Elevated LDL cholesterol level: Lipid panel updated on 04/14/22: Total cholesterol 208, HDL 38, TG 182, and LDL 138.    Anxiety and depression  Recurrent oral herpes simplex - Orolabial-recurrent,  immunosuppressed patient.  Not currently symptomatic.  History of hypothyroidism  Former smoker  Orders: Orders Placed This Encounter  Procedures   COMPLETE METABOLIC PANEL WITH GFR   CBC with Differential/Platelet   Meds ordered this encounter  Medications   Upadacitinib ER (RINVOQ) 15 MG TB24    Sig: Take 1 tablet (15 mg total) by mouth daily.    Dispense:  90 tablet    Refill:  0     Follow-Up Instructions: Return in about 3 months (around 11/13/2022) for Rheumatoid arthritis.   Gearldine Bienenstock, PA-C  Note - This record has been created using Dragon software.  Chart creation errors have been sought, but may not always  have been located. Such creation errors do not reflect on  the standard of medical care.

## 2022-08-13 ENCOUNTER — Other Ambulatory Visit: Payer: Self-pay

## 2022-08-13 ENCOUNTER — Ambulatory Visit: Payer: BC Managed Care – PPO | Attending: Physician Assistant | Admitting: Physician Assistant

## 2022-08-13 ENCOUNTER — Encounter: Payer: Self-pay | Admitting: Physician Assistant

## 2022-08-13 ENCOUNTER — Other Ambulatory Visit (HOSPITAL_COMMUNITY): Payer: Self-pay

## 2022-08-13 VITALS — BP 134/85 | HR 92 | Resp 17 | Ht 67.0 in | Wt 315.0 lb

## 2022-08-13 DIAGNOSIS — M0579 Rheumatoid arthritis with rheumatoid factor of multiple sites without organ or systems involvement: Secondary | ICD-10-CM

## 2022-08-13 DIAGNOSIS — Z79899 Other long term (current) drug therapy: Secondary | ICD-10-CM

## 2022-08-13 DIAGNOSIS — F32A Depression, unspecified: Secondary | ICD-10-CM

## 2022-08-13 DIAGNOSIS — E7800 Pure hypercholesterolemia, unspecified: Secondary | ICD-10-CM

## 2022-08-13 DIAGNOSIS — Z8639 Personal history of other endocrine, nutritional and metabolic disease: Secondary | ICD-10-CM

## 2022-08-13 DIAGNOSIS — F419 Anxiety disorder, unspecified: Secondary | ICD-10-CM

## 2022-08-13 DIAGNOSIS — B002 Herpesviral gingivostomatitis and pharyngotonsillitis: Secondary | ICD-10-CM

## 2022-08-13 DIAGNOSIS — Z87891 Personal history of nicotine dependence: Secondary | ICD-10-CM

## 2022-08-13 MED ORDER — RINVOQ 15 MG PO TB24
1.0000 | ORAL_TABLET | Freq: Every day | ORAL | 0 refills | Status: DC
Start: 2022-08-13 — End: 2023-01-18
  Filled 2022-08-13: qty 90, 90d supply, fill #0
  Filled 2022-08-23: qty 30, 30d supply, fill #0
  Filled 2022-09-14: qty 30, 30d supply, fill #1
  Filled 2022-11-02: qty 30, 30d supply, fill #2

## 2022-08-13 NOTE — Patient Instructions (Addendum)
Standing Labs We placed an order today for your standing lab work.   Please have your standing labs drawn on Monday, 08/16/2022 and then every 3 months   Please have your labs drawn 2 weeks prior to your appointment so that the provider can discuss your lab results at your appointment, if possible.  Please note that you may see your imaging and lab results in MyChart before we have reviewed them. We will contact you once all results are reviewed. Please allow our office up to 72 hours to thoroughly review all of the results before contacting the office for clarification of your results.  WALK-IN LAB HOURS  Monday through Thursday from 8:00 am -12:30 pm and 1:00 pm-5:00 pm and Friday from 8:00 am-12:00 pm.  Patients with office visits requiring labs will be seen before walk-in labs.  You may encounter longer than normal wait times. Please allow additional time. Wait times may be shorter on  Monday and Thursday afternoons.  We do not book appointments for walk-in labs. We appreciate your patience and understanding with our staff.   Labs are drawn by [facility]. Please bring your co-pay at the time of your lab draw.  You may receive a bill from [facility] for your lab work.  Please note if you are on Hydroxychloroquine and and an order has been placed for a Hydroxychloroquine level,  you will need to have it drawn 4 hours or more after your last dose.  If you wish to have your labs drawn at another location, please call the office 24 hours in advance so we can fax the orders.  The office is located at [address], [address], [place], [place] [address]   If you have any questions regarding directions or hours of operation,  please call [phone].   As a reminder, please drink plenty of water prior to coming for your lab work. Thanks!

## 2022-08-19 ENCOUNTER — Other Ambulatory Visit (HOSPITAL_COMMUNITY): Payer: Self-pay

## 2022-08-23 ENCOUNTER — Other Ambulatory Visit (HOSPITAL_COMMUNITY): Payer: Self-pay

## 2022-08-23 ENCOUNTER — Other Ambulatory Visit: Payer: Self-pay

## 2022-09-14 ENCOUNTER — Other Ambulatory Visit (HOSPITAL_COMMUNITY): Payer: Self-pay

## 2022-09-15 ENCOUNTER — Other Ambulatory Visit (HOSPITAL_COMMUNITY): Payer: Self-pay

## 2022-09-16 ENCOUNTER — Other Ambulatory Visit (HOSPITAL_COMMUNITY): Payer: Self-pay

## 2022-09-17 ENCOUNTER — Other Ambulatory Visit (HOSPITAL_COMMUNITY): Payer: Self-pay

## 2022-10-04 ENCOUNTER — Other Ambulatory Visit (HOSPITAL_COMMUNITY): Payer: Self-pay

## 2022-10-11 ENCOUNTER — Other Ambulatory Visit (HOSPITAL_COMMUNITY): Payer: Self-pay

## 2022-10-15 ENCOUNTER — Other Ambulatory Visit (HOSPITAL_COMMUNITY): Payer: Self-pay

## 2022-10-18 ENCOUNTER — Other Ambulatory Visit (HOSPITAL_COMMUNITY): Payer: Self-pay

## 2022-10-20 ENCOUNTER — Other Ambulatory Visit (HOSPITAL_COMMUNITY): Payer: Self-pay

## 2022-11-01 ENCOUNTER — Telehealth: Payer: Self-pay | Admitting: Pharmacist

## 2022-11-01 NOTE — Telephone Encounter (Signed)
Clinical questions for Rinvoq PA renewal completed on CMM

## 2022-11-01 NOTE — Telephone Encounter (Signed)
Submitted a Prior Authorization renewal request to [facility] for RINVOQ via CoverMyMeds. Pending clinical questions to populate  Key: [id number]  Chesley Mires, PharmD, MPH, BCPS, CPP Clinical Pharmacist (Rheumatology and Pulmonology)

## 2022-11-02 ENCOUNTER — Other Ambulatory Visit: Payer: Self-pay

## 2022-11-02 ENCOUNTER — Other Ambulatory Visit (HOSPITAL_COMMUNITY): Payer: Self-pay

## 2022-11-03 NOTE — Telephone Encounter (Signed)
Received notification from [facility] regarding a prior authorization for [facility]. Authorization has been APPROVED from 11/01/22 to 11/01/23. Approval letter sent to scan center.  Patient can fill through [facility]: [phone]   Authorization # [id number]  Patient overdue for labs.  Chesley Mires, PharmD, MPH, BCPS, CPP Clinical Pharmacist (Rheumatology and Pulmonology)

## 2022-11-19 ENCOUNTER — Ambulatory Visit: Payer: BC Managed Care – PPO | Admitting: Physician Assistant

## 2022-11-24 ENCOUNTER — Other Ambulatory Visit: Payer: Self-pay | Admitting: Physician Assistant

## 2022-11-24 ENCOUNTER — Other Ambulatory Visit (HOSPITAL_COMMUNITY): Payer: Self-pay

## 2022-11-24 DIAGNOSIS — M0579 Rheumatoid arthritis with rheumatoid factor of multiple sites without organ or systems involvement: Secondary | ICD-10-CM

## 2022-12-01 ENCOUNTER — Other Ambulatory Visit (HOSPITAL_COMMUNITY): Payer: Self-pay

## 2022-12-03 ENCOUNTER — Other Ambulatory Visit (HOSPITAL_COMMUNITY): Payer: Self-pay

## 2022-12-08 DIAGNOSIS — F909 Attention-deficit hyperactivity disorder, unspecified type: Secondary | ICD-10-CM | POA: Diagnosis not present

## 2022-12-08 DIAGNOSIS — F331 Major depressive disorder, recurrent, moderate: Secondary | ICD-10-CM | POA: Diagnosis not present

## 2022-12-08 DIAGNOSIS — F4312 Post-traumatic stress disorder, chronic: Secondary | ICD-10-CM | POA: Diagnosis not present

## 2022-12-10 DIAGNOSIS — R5383 Other fatigue: Secondary | ICD-10-CM | POA: Diagnosis not present

## 2022-12-10 DIAGNOSIS — E559 Vitamin D deficiency, unspecified: Secondary | ICD-10-CM | POA: Diagnosis not present

## 2022-12-10 DIAGNOSIS — I10 Essential (primary) hypertension: Secondary | ICD-10-CM | POA: Diagnosis not present

## 2022-12-10 DIAGNOSIS — R739 Hyperglycemia, unspecified: Secondary | ICD-10-CM | POA: Diagnosis not present

## 2022-12-10 DIAGNOSIS — E782 Mixed hyperlipidemia: Secondary | ICD-10-CM | POA: Diagnosis not present

## 2022-12-10 DIAGNOSIS — E8881 Metabolic syndrome: Secondary | ICD-10-CM | POA: Diagnosis not present

## 2022-12-12 LAB — LAB REPORT - SCANNED
A1c: 5.8
EGFR: 116

## 2022-12-21 ENCOUNTER — Other Ambulatory Visit (HOSPITAL_COMMUNITY): Payer: Self-pay

## 2022-12-23 ENCOUNTER — Other Ambulatory Visit (HOSPITAL_COMMUNITY): Payer: Self-pay

## 2022-12-28 DIAGNOSIS — F4312 Post-traumatic stress disorder, chronic: Secondary | ICD-10-CM | POA: Diagnosis not present

## 2022-12-28 DIAGNOSIS — F331 Major depressive disorder, recurrent, moderate: Secondary | ICD-10-CM | POA: Diagnosis not present

## 2022-12-28 DIAGNOSIS — F909 Attention-deficit hyperactivity disorder, unspecified type: Secondary | ICD-10-CM | POA: Diagnosis not present

## 2023-01-17 NOTE — Progress Notes (Unsigned)
Office Visit Note  Patient: Lori Valentine             Date of Birth: [birth year]           MRN: [id number]             PCP: Ivery Quale, MD Referring: Ivery Quale, MD Visit Date: 01/18/2023 Occupation: @GUAROCC @  Subjective:  No chief complaint on file.   History of Present Illness: Lori Valentine is a 37 y.o. female ***     Activities of Daily Living:  Patient reports morning stiffness for *** {minute/hour:19697}.   Patient {ACTIONS;DENIES/REPORTS:21021675::"Denies"} nocturnal pain.  Difficulty dressing/grooming: {ACTIONS;DENIES/REPORTS:21021675::"Denies"} Difficulty climbing stairs: {ACTIONS;DENIES/REPORTS:21021675::"Denies"} Difficulty getting out of chair: {ACTIONS;DENIES/REPORTS:21021675::"Denies"} Difficulty using hands for taps, buttons, cutlery, and/or writing: {ACTIONS;DENIES/REPORTS:21021675::"Denies"}  No Rheumatology ROS completed.   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   History of anxiety and depression 10/22/2016   Smoker 10/22/2016   Rheumatoid arthritis (HCC) 05/13/2015   Hypothyroidism 07/29/2014    Past Medical History:  Diagnosis Date   Hypothyroid    Rheumatoid arthritis (HCC)    Twin gestation, 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        Multiple Gestation       MC/DA - O30.039  Concordant (< 20%), nml AFV, AGA    Discordant (>20%)**  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              DC/DA - O30.049  Concordant (<20%), nml AFV, AGA, no other comorbidities  Discordant (> 20%)**    O30.003 is extra code for discordant growth   Q 3 wks 16-32, Q 2 wks 32-delivery Q 1 wk, Fluid  alternating w/ growth   20-24-28-32-36 Q 2-3 wks     Family History  Problem Relation Age of Onset   Schizophrenia Mother    Bipolar disorder Mother    Diabetes Other    Heart disease Other    Diabetes Sister    Healthy Son    Past Surgical History:  Procedure Laterality Date   CESAREAN SECTION N/A 08/04/2017   Procedure: CESAREAN SECTION;  Surgeon: Hermina Staggers, MD;  Location: [facility] BIRTHING SUITES;  Service: Obstetrics;  Laterality: N/A;   TUBAL LIGATION Bilateral 08/04/2017   Procedure: BILATERAL TUBAL LIGATION;  Surgeon: Hermina Staggers, MD;  Location: [facility] BIRTHING SUITES;  Service: Obstetrics;  Laterality: Bilateral;   Social History   Social History Narrative   70 year old son   Lives with  Sister and nephew and son.   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   Physical Exam   Musculoskeletal Exam: ***  CDAI Exam: CDAI Score: -- Patient Global: --; Provider Global: -- Swollen: --; Tender: -- Joint Exam 01/18/2023   No joint exam has been documented for this visit   There is currently no information documented on the homunculus. Go to the Rheumatology activity and complete the homunculus joint exam.  Investigation: No additional findings.  Imaging: No results found.  Recent Labs: Lab Results  Component Value Date   WBC 4.7 04/14/2022   HGB 12.8 04/14/2022   PLT 272 04/14/2022   NA 138 04/14/2022   K 4.5 04/14/2022   CL 105 04/14/2022   CO2 25 04/14/2022   GLUCOSE 90 04/14/2022   BUN 9 04/14/2022   CREATININE 0.71 04/14/2022   BILITOT 0.3 04/14/2022   ALKPHOS 253 (H) 08/04/2017   AST 16 04/14/2022   ALT 24 04/14/2022   PROT 7.3  04/14/2022   ALBUMIN 2.5 (L) 08/04/2017   CALCIUM 9.0 04/14/2022   GFRAA 132 06/16/2020   QFTBGOLD Negative 07/07/2016   QFTBGOLDPLUS NEGATIVE 12/15/2021    Speciality Comments: PPD skin test: 02/01/2020 negative.   Prior therapy: Enbrel, Cimzia, Remicade, Plaquenil, and MTX monotherapy  Procedures:  No procedures performed Allergies: Effexor [venlafaxine]   Assessment / Plan:     Visit Diagnoses: No diagnosis found.  Orders: No orders of the defined types were placed in this encounter.  No orders of the defined types were placed in this encounter.   Face-to-face time spent with patient was *** minutes. Greater than 50% of time was spent in counseling and coordination of care.  Follow-Up Instructions: No follow-ups on file.   Ellen Henri, CMA  Note - This record has been created using Animal nutritionist.  Chart creation errors have been sought, but may not always  have been located. Such creation errors do not reflect on  the standard of medical care.

## 2023-01-17 NOTE — Progress Notes (Deleted)
Office Visit Note  Patient: Lori Valentine             Date of Birth: 06-18-1985           MRN: [id number]             PCP: Ivery Quale, MD Referring: Ivery Quale, MD Visit Date: 01/31/2023 Occupation: @GUAROCC @  Subjective:  No chief complaint on file.   History of Present Illness: Lori Valentine is a 37 y.o. female ***     Activities of Daily Living:  Patient reports morning stiffness for *** {minute/hour:19697}.   Patient {ACTIONS;DENIES/REPORTS:21021675::"Denies"} nocturnal pain.  Difficulty dressing/grooming: {ACTIONS;DENIES/REPORTS:21021675::"Denies"} Difficulty climbing stairs: {ACTIONS;DENIES/REPORTS:21021675::"Denies"} Difficulty getting out of chair: {ACTIONS;DENIES/REPORTS:21021675::"Denies"} Difficulty using hands for taps, buttons, cutlery, and/or writing: {ACTIONS;DENIES/REPORTS:21021675::"Denies"}  No Rheumatology ROS completed.   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   History of anxiety and depression 10/22/2016   Smoker 10/22/2016   Rheumatoid arthritis (HCC) 05/13/2015   Hypothyroidism 07/29/2014    Past Medical History:  Diagnosis Date   Hypothyroid    Rheumatoid arthritis (HCC)    Twin gestation, 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        Multiple Gestation       MC/DA - O30.039  Concordant (< 20%), nml AFV, AGA    Discordant (>20%)**  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              DC/DA - O30.049  Concordant (<20%), nml AFV, AGA, no other comorbidities  Discordant (> 20%)**    O30.003 is extra code for discordant growth   Q 3 wks 16-32, Q 2 wks 32-delivery Q 1 wk, Fluid  alternating w/ growth   20-24-28-32-36 Q 2-3 wks     Family History  Problem Relation Age of Onset   Schizophrenia Mother    Bipolar disorder Mother    Diabetes Other    Heart disease Other    Diabetes Sister    Healthy Son    Past Surgical History:  Procedure Laterality Date   CESAREAN SECTION N/A 08/04/2017   Procedure: CESAREAN SECTION;  Surgeon: Hermina Staggers, MD;  Location: [facility] BIRTHING SUITES;  Service: Obstetrics;  Laterality: N/A;   TUBAL LIGATION Bilateral 08/04/2017   Procedure: BILATERAL TUBAL LIGATION;  Surgeon: Hermina Staggers, MD;  Location: [facility] BIRTHING SUITES;  Service: Obstetrics;  Laterality: Bilateral;   Social History   Social History Narrative   66 year old son   Lives with  Sister and nephew and son.   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   Physical Exam   Musculoskeletal Exam: ***  CDAI Exam: CDAI Score: -- Patient Global: --; Provider Global: -- Swollen: --; Tender: -- Joint Exam 01/31/2023   No joint exam has been documented for this visit   There is currently no information documented on the homunculus. Go to the Rheumatology activity and complete the homunculus joint exam.  Investigation: No additional findings.  Imaging: No results found.  Recent Labs: Lab Results  Component Value Date   WBC 4.7 04/14/2022   HGB 12.8 04/14/2022   PLT 272 04/14/2022   NA 138 04/14/2022   K 4.5 04/14/2022   CL 105 04/14/2022   CO2 25 04/14/2022   GLUCOSE 90 04/14/2022   BUN 9 04/14/2022   CREATININE 0.71 04/14/2022   BILITOT 0.3 04/14/2022   ALKPHOS 253 (H) 08/04/2017   AST 16 04/14/2022   ALT 24 04/14/2022   PROT 7.3  04/14/2022   ALBUMIN 2.5 (L) 08/04/2017   CALCIUM 9.0 04/14/2022   GFRAA 132 06/16/2020   QFTBGOLD Negative 07/07/2016   QFTBGOLDPLUS NEGATIVE 12/15/2021    Speciality Comments: PPD skin test: 02/01/2020 negative.   Prior therapy: Enbrel, Cimzia, Remicade, Plaquenil, and MTX monotherapy  Procedures:  No procedures performed Allergies: Effexor [venlafaxine]   Assessment / Plan:     Visit Diagnoses: Rheumatoid arthritis involving multiple sites with positive rheumatoid factor (HCC)  High risk medication use  Elevated LDL cholesterol level  Anxiety and depression  Recurrent oral herpes simplex  History of hypothyroidism  Former smoker  Orders: No orders of the defined types were placed in this encounter.  No orders of the defined types were placed in this encounter.   Face-to-face time spent with patient was *** minutes. Greater than 50% of time was spent in counseling and coordination of care.  Follow-Up Instructions: No follow-ups on file.   Gearldine Bienenstock, PA-C  Note - This record has been created using Dragon software.  Chart creation errors have been sought, but may not always  have been located. Such creation errors do not reflect on  the standard of medical care.

## 2023-01-18 ENCOUNTER — Ambulatory Visit: Payer: BC Managed Care – PPO | Attending: Physician Assistant | Admitting: Physician Assistant

## 2023-01-18 ENCOUNTER — Other Ambulatory Visit: Payer: Self-pay

## 2023-01-18 ENCOUNTER — Encounter: Payer: Self-pay | Admitting: Physician Assistant

## 2023-01-18 VITALS — BP 138/98 | HR 94 | Resp 17 | Ht 67.0 in | Wt 318.4 lb

## 2023-01-18 DIAGNOSIS — Z87891 Personal history of nicotine dependence: Secondary | ICD-10-CM | POA: Diagnosis not present

## 2023-01-18 DIAGNOSIS — E7800 Pure hypercholesterolemia, unspecified: Secondary | ICD-10-CM

## 2023-01-18 DIAGNOSIS — F32A Depression, unspecified: Secondary | ICD-10-CM | POA: Diagnosis not present

## 2023-01-18 DIAGNOSIS — F419 Anxiety disorder, unspecified: Secondary | ICD-10-CM | POA: Diagnosis not present

## 2023-01-18 DIAGNOSIS — M940 Chondrocostal junction syndrome [Tietze]: Secondary | ICD-10-CM

## 2023-01-18 DIAGNOSIS — B002 Herpesviral gingivostomatitis and pharyngotonsillitis: Secondary | ICD-10-CM

## 2023-01-18 DIAGNOSIS — Z79899 Other long term (current) drug therapy: Secondary | ICD-10-CM | POA: Diagnosis not present

## 2023-01-18 DIAGNOSIS — Z8639 Personal history of other endocrine, nutritional and metabolic disease: Secondary | ICD-10-CM | POA: Diagnosis not present

## 2023-01-18 DIAGNOSIS — M0579 Rheumatoid arthritis with rheumatoid factor of multiple sites without organ or systems involvement: Secondary | ICD-10-CM

## 2023-01-18 MED ORDER — RINVOQ 15 MG PO TB24
1.0000 | ORAL_TABLET | Freq: Every day | ORAL | 0 refills | Status: DC
  Filled 2023-01-18: qty 90, 90d supply, fill #0
  Filled 2023-01-19: qty 30, 30d supply, fill #0
  Filled 2023-02-17: qty 30, 30d supply, fill #1
  Filled 2023-03-16: qty 30, 30d supply, fill #2

## 2023-01-18 MED ORDER — PREDNISONE 5 MG PO TABS: ORAL_TABLET | ORAL | 0 refills | Status: DC

## 2023-01-18 NOTE — Telephone Encounter (Signed)
Please review and sign pended rinvoq refill. Thanks!

## 2023-01-18 NOTE — Patient Instructions (Signed)
Standing Labs We placed an order today for your standing lab work.   Please have your standing labs drawn in December and every 3 months   Please have your labs drawn 2 weeks prior to your appointment so that the provider can discuss your lab results at your appointment, if possible.  Please note that you may see your imaging and lab results in MyChart before we have reviewed them. We will contact you once all results are reviewed. Please allow our office up to 72 hours to thoroughly review all of the results before contacting the office for clarification of your results.  WALK-IN LAB HOURS  Monday through Thursday from 8:00 am -12:30 pm and 1:00 pm-5:00 pm and Friday from 8:00 am-12:00 pm.  Patients with office visits requiring labs will be seen before walk-in labs.  You may encounter longer than normal wait times. Please allow additional time. Wait times may be shorter on  Monday and Thursday afternoons.  We do not book appointments for walk-in labs. We appreciate your patience and understanding with our staff.   Labs are drawn by [facility]. Please bring your co-pay at the time of your lab draw.  You may receive a bill from [facility] for your lab work.  Please note if you are on Hydroxychloroquine and and an order has been placed for a Hydroxychloroquine level,  you will need to have it drawn 4 hours or more after your last dose.  If you wish to have your labs drawn at another location, please call the office 24 hours in advance so we can fax the orders.  The office is located at [address], [place], [place] [address]   If you have any questions regarding directions or hours of operation,  please call [phone].   As a reminder, please drink plenty of water prior to coming for your lab work. Thanks!

## 2023-01-19 ENCOUNTER — Other Ambulatory Visit: Payer: Self-pay

## 2023-01-19 NOTE — Progress Notes (Signed)
Specialty Pharmacy Ongoing Clinical Assessment Note  Lori Valentine is a 37 y.o. female who is being followed by the specialty pharmacy service for RxSp Rheumatoid Arthritis   Patient's specialty medication(s) reviewed today: Upadacitinib   Missed doses in the last 4 weeks: (S) More than 5 (Held doses due to Covid)   Patient/Caregiver did not have any additional questions or concerns.   Therapeutic benefit summary: Patient is achieving benefit   Adverse events/side effects summary: No adverse events/side effects   Patient's therapy is appropriate to: Continue    Goals Addressed             This Visit's Progress    Reduce inflammation       Patient is on track. Patient will maintain adherence         Follow up:  6 months  Bobette Mo Specialty Pharmacist

## 2023-01-19 NOTE — Progress Notes (Signed)
Specialty Pharmacy Refill Coordination Note  Lori Valentine is a 37 y.o. female contacted today regarding refills of specialty medication(s) Upadacitinib   Patient requested Delivery   Delivery date: 01/25/23   Verified address: [address] [name] [address]   Medication will be filled on 01/24/23.

## 2023-01-20 LAB — QUANTIFERON-TB GOLD PLUS
Mitogen-NIL: 8.24 [IU]/mL
NIL: 0.02 [IU]/mL
QuantiFERON-TB Gold Plus: NEGATIVE
TB1-NIL: 0 [IU]/mL
TB2-NIL: 0 [IU]/mL

## 2023-01-24 ENCOUNTER — Other Ambulatory Visit (HOSPITAL_COMMUNITY): Payer: Self-pay

## 2023-01-24 ENCOUNTER — Other Ambulatory Visit: Payer: Self-pay

## 2023-01-24 NOTE — Progress Notes (Signed)
TB gold negative

## 2023-01-31 ENCOUNTER — Ambulatory Visit: Payer: BC Managed Care – PPO | Admitting: Physician Assistant

## 2023-02-03 DIAGNOSIS — F331 Major depressive disorder, recurrent, moderate: Secondary | ICD-10-CM | POA: Diagnosis not present

## 2023-02-03 DIAGNOSIS — F909 Attention-deficit hyperactivity disorder, unspecified type: Secondary | ICD-10-CM | POA: Diagnosis not present

## 2023-02-03 DIAGNOSIS — F4312 Post-traumatic stress disorder, chronic: Secondary | ICD-10-CM | POA: Diagnosis not present

## 2023-02-11 ENCOUNTER — Other Ambulatory Visit (HOSPITAL_COMMUNITY): Payer: Self-pay

## 2023-02-14 ENCOUNTER — Other Ambulatory Visit (HOSPITAL_COMMUNITY): Payer: Self-pay

## 2023-02-16 DIAGNOSIS — R0683 Snoring: Secondary | ICD-10-CM | POA: Diagnosis not present

## 2023-02-16 DIAGNOSIS — Z1331 Encounter for screening for depression: Secondary | ICD-10-CM | POA: Diagnosis not present

## 2023-02-16 DIAGNOSIS — R7303 Prediabetes: Secondary | ICD-10-CM | POA: Diagnosis not present

## 2023-02-16 DIAGNOSIS — E559 Vitamin D deficiency, unspecified: Secondary | ICD-10-CM | POA: Diagnosis not present

## 2023-02-16 DIAGNOSIS — E782 Mixed hyperlipidemia: Secondary | ICD-10-CM | POA: Diagnosis not present

## 2023-02-17 ENCOUNTER — Other Ambulatory Visit (HOSPITAL_COMMUNITY): Payer: Self-pay

## 2023-02-17 ENCOUNTER — Other Ambulatory Visit: Payer: Self-pay

## 2023-02-17 MED ORDER — WEGOVY 0.25 MG/0.5ML ~~LOC~~ SOAJ
0.2500 mg | SUBCUTANEOUS | 0 refills | Status: DC
Start: 2023-02-17 — End: 2023-07-27
  Filled 2023-02-17 – 2023-04-07 (×4): qty 2, 28d supply, fill #0

## 2023-02-17 NOTE — Progress Notes (Signed)
Specialty Pharmacy Refill Coordination Note  Lori Valentine is a 37 y.o. female contacted today regarding refills of specialty medication(s) Upadacitinib   Patient requested Delivery   Delivery date: 02/21/23   Verified address: [address] [name] [address]   Medication will be filled on 02/18/23.

## 2023-02-18 ENCOUNTER — Other Ambulatory Visit (HOSPITAL_COMMUNITY): Payer: Self-pay

## 2023-02-18 ENCOUNTER — Other Ambulatory Visit: Payer: Self-pay

## 2023-03-01 ENCOUNTER — Other Ambulatory Visit (HOSPITAL_COMMUNITY): Payer: Self-pay

## 2023-03-02 DIAGNOSIS — F909 Attention-deficit hyperactivity disorder, unspecified type: Secondary | ICD-10-CM | POA: Diagnosis not present

## 2023-03-02 DIAGNOSIS — F4312 Post-traumatic stress disorder, chronic: Secondary | ICD-10-CM | POA: Diagnosis not present

## 2023-03-02 DIAGNOSIS — F331 Major depressive disorder, recurrent, moderate: Secondary | ICD-10-CM | POA: Diagnosis not present

## 2023-03-02 DIAGNOSIS — E039 Hypothyroidism, unspecified: Secondary | ICD-10-CM | POA: Diagnosis not present

## 2023-03-02 DIAGNOSIS — R0683 Snoring: Secondary | ICD-10-CM | POA: Diagnosis not present

## 2023-03-02 DIAGNOSIS — R7303 Prediabetes: Secondary | ICD-10-CM | POA: Diagnosis not present

## 2023-03-02 DIAGNOSIS — E559 Vitamin D deficiency, unspecified: Secondary | ICD-10-CM | POA: Diagnosis not present

## 2023-03-08 DIAGNOSIS — R0683 Snoring: Secondary | ICD-10-CM | POA: Diagnosis not present

## 2023-03-14 ENCOUNTER — Other Ambulatory Visit (HOSPITAL_COMMUNITY): Payer: Self-pay

## 2023-03-16 ENCOUNTER — Other Ambulatory Visit (HOSPITAL_COMMUNITY): Payer: Self-pay | Admitting: Pharmacy Technician

## 2023-03-16 ENCOUNTER — Other Ambulatory Visit (HOSPITAL_COMMUNITY): Payer: Self-pay

## 2023-03-16 NOTE — Progress Notes (Signed)
Specialty Pharmacy Refill Coordination Note  Lori Valentine is a 37 y.o. female contacted today regarding refills of specialty medication(s) Upadacitinib   Patient requested Delivery   Delivery date: 03/23/23   Verified address: [name]   Medication will be filled on 03/22/23.

## 2023-03-22 ENCOUNTER — Other Ambulatory Visit: Payer: Self-pay

## 2023-04-07 ENCOUNTER — Other Ambulatory Visit (HOSPITAL_BASED_OUTPATIENT_CLINIC_OR_DEPARTMENT_OTHER): Payer: Self-pay

## 2023-04-07 MED ORDER — WEGOVY 0.5 MG/0.5ML ~~LOC~~ SOAJ
0.5000 mg | SUBCUTANEOUS | 0 refills | Status: AC
Start: 2023-04-07 — End: ?
  Filled 2023-04-07 – 2023-07-01 (×3): qty 2, 28d supply, fill #0

## 2023-04-08 NOTE — Progress Notes (Signed)
Office Visit Note  Patient: Lori Valentine             Date of Birth: [birth year]           MRN: [id number]             PCP: Ivery Quale, MD Referring: Ivery Quale, MD Visit Date: 04/22/2023 Occupation: @GUAROCC @  Subjective:  Medication monitoring   History of Present Illness: Lori Valentine is a 38 y.o. female with history of seropositive rheumatoid arthritis.  Patient remains on Rinvoq 15 mg 1 tablet by mouth daily.  She is tolerating Rinvoq without any side effects and has not had any recent interruptions in therapy.  She continues to find Rinvoq to be effective at managing her rheumatoid arthritis.  Her morning stiffness has been lasting for about 30 minutes daily.  She has not had any nocturnal pain.  Patient reports that she is planning to have gastric bypass this year.  She has established care at the [facility] center and is currently on Wegovy.  Patient states that she was recently started on Chantix and is planning to quit smoking prior to undergoing gastric bypass. She denies any recent or recurrent infections.   Activities of Daily Living:  Patient reports morning stiffness for 30 minutes.   Patient Denies nocturnal pain.  Difficulty dressing/grooming: Denies Difficulty climbing stairs: Reports Difficulty getting out of chair: Reports Difficulty using hands for taps, buttons, cutlery, and/or writing: Reports  Review of Systems  Constitutional:  Positive for fatigue.  HENT:  Negative for mouth sores and mouth dryness.   Eyes:  Negative for dryness.  Respiratory:  Negative for shortness of breath.   Cardiovascular:  Negative for chest pain and palpitations.  Gastrointestinal:  Positive for constipation. Negative for blood in stool and diarrhea.  Endocrine: Positive for increased urination.  Genitourinary:  Negative for involuntary urination.  Musculoskeletal:  Positive for joint pain, joint pain, joint swelling, myalgias, morning stiffness, muscle tenderness and  myalgias. Negative for gait problem and muscle weakness.  Skin:  Negative for color change, rash, hair loss and sensitivity to sunlight.  Allergic/Immunologic: Positive for susceptible to infections.  Neurological:  Negative for dizziness and headaches.  Hematological:  Negative for swollen glands.  Psychiatric/Behavioral:  Positive for sleep disturbance. Negative for depressed mood. The patient is not nervous/anxious.     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   History of anxiety and depression 10/22/2016   Smoker 10/22/2016   Rheumatoid arthritis (HCC) 05/13/2015   Hypothyroidism 07/29/2014    Past Medical History:  Diagnosis Date   Hypothyroid    Rheumatoid arthritis (HCC)    Twin gestation, 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        Multiple Gestation       MC/DA - O30.039  Concordant (< 20%), nml AFV, AGA    Discordant (>20%)**  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              DC/DA - O30.049  Concordant (<20%), nml AFV, AGA, no other comorbidities  Discordant (> 20%)**    O30.003 is extra code for discordant growth   Q 3 wks 16-32, Q 2 wks 32-delivery  Q 1 wk, Fluid alternating w/ growth   20-24-28-32-36 Q 2-3 wks     Family History  Problem Relation Age of Onset   Schizophrenia Mother    Bipolar disorder Mother    Diabetes Other    Heart disease Other    Diabetes Sister    Healthy Son    Past Surgical History:  Procedure Laterality Date   CESAREAN SECTION N/A 08/04/2017   Procedure: CESAREAN SECTION;  Surgeon: Hermina Staggers, MD;  Location: [facility] BIRTHING SUITES;  Service: Obstetrics;  Laterality:  N/A;   TUBAL LIGATION Bilateral 08/04/2017   Procedure: BILATERAL TUBAL LIGATION;  Surgeon: Hermina Staggers, MD;  Location: [facility] BIRTHING SUITES;  Service: Obstetrics;  Laterality: Bilateral;   Social History   Social History Narrative   48 year old son   Lives with  Sister and nephew and son.   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  Constitutional:      Appearance: She is well-developed.  HENT:     Head: Normocephalic and atraumatic.  Eyes:     Conjunctiva/sclera: Conjunctivae normal.  Cardiovascular:     Rate and Rhythm: Normal rate and regular rhythm.     Heart sounds: Normal heart sounds.  Pulmonary:     Effort: Pulmonary effort is normal.     Breath sounds: Normal breath sounds.  Abdominal:     General: Bowel sounds are normal.     Palpations: Abdomen is soft.  Musculoskeletal:     Cervical back: Normal range of motion.  Lymphadenopathy:     Cervical: No cervical adenopathy.  Skin:    General: Skin is warm and dry.     Capillary Refill: Capillary refill takes less than 2 seconds.  Neurological:     Mental Status: She is alert and oriented to person, place, and time.  Psychiatric:        Behavior: Behavior normal.      Musculoskeletal Exam: Patient remained seated on the examination table.  C-spine has slightly limited range of motion with lateral rotation.  Shoulder joints have good range of motion with no discomfort at this time.  Rheumatoid  nodule noted on the extensor surface of the right elbow.  Limited extension of both wrist joints with synovial thickening.  Tenderness and inflammation noted in the right fifth PIP joint.  Synovial thickening of MCP joints but no tenderness noted.  Knee joints have good ROM with no effusion.  Ankle joints have good ROM with no tenderness.    CDAI Exam: CDAI Score: -- Patient Global: --; Provider Global: -- Swollen: --; Tender: -- Joint Exam 04/22/2023   No joint exam has been documented for this visit   There is currently no information documented on the homunculus. Go to the Rheumatology activity and complete the homunculus joint exam.  Investigation: No additional findings.  Imaging: No results found.  Recent Labs: Lab Results  Component Value Date   WBC 4.7  04/14/2022   HGB 12.8 04/14/2022   PLT 272 04/14/2022   NA 138 04/14/2022   K 4.5 04/14/2022   CL 105 04/14/2022   CO2 25 04/14/2022   GLUCOSE 90 04/14/2022   BUN 9 04/14/2022   CREATININE 0.71 04/14/2022   BILITOT 0.3 04/14/2022   ALKPHOS 253 (H) 08/04/2017   AST 16 04/14/2022   ALT 24 04/14/2022   PROT 7.3 04/14/2022   ALBUMIN 2.5 (L) 08/04/2017   CALCIUM 9.0 04/14/2022   GFRAA 132 06/16/2020   QFTBGOLD Negative 07/07/2016   QFTBGOLDPLUS NEGATIVE 01/18/2023    Speciality Comments: PPD skin test: 02/01/2020 negative.   Prior therapy: Enbrel, Cimzia, Remicade, Plaquenil, and MTX monotherapy  Procedures:  No procedures performed Allergies: Effexor [venlafaxine]   Assessment / Plan:     Visit Diagnoses: Rheumatoid arthritis involving multiple sites with positive rheumatoid factor (HCC) - Prior therapy: Enbrel, Cimzia, Remicade, Plaquenil, and MTX monotherapy: Patient has not had any recent rheumatoid arthritis flares.  On examination today she has some tenderness and inflammation in the right fifth PIP joint.  Her morning stiffness has been lasting for about 30 minutes daily.  She has not had any nocturnal  pain.  Overall her rheumatoid arthritis remains well-controlled on Rinvoq as monotherapy as long as she does not have interruptions in therapy. She is tolerating Rinvoq without any side effects and has not had any recent gaps in therapy.  No recent or recurrent infections.  No medication changes will be made at this time.  She was advised to notify us if she develops signs or symptoms of a flare.  She will follow-up in the office in 5 months or sooner if needed.  - Plan: Lipid panel  High risk medication use - Rinvoq 15 mg 1 tablet by mouth daily.  TB gold negative on 01/18/23.   Orders for CBC and CMP released today.  Patient had to leave for a meeting and plans on returning for lab work.   Lipid panel updated on 12/12/22. Lipid panel released today-patient had to leave the office prior to obtaining lab work due to having to go to a meeting but plans on returning to have updated lab work drawn. No recent or recurrent infections.  Discussed the importance of holding rinvoq if she develops signs or symptoms of an infection and to resume once the infection has completely cleared.    - Plan: COMPLETE METABOLIC PANEL WITH GFR, CBC with Differential/Platelet, Lipid panel  Costochondritis: Not currently symptomatic.   Elevated LDL cholesterol level -Lipid panel updated today.   Plan: Lipid panel  Other medical conditions are listed as follows:   Anxiety and depression  Recurrent oral herpes simplex - - Orolabial-recurrent, immunosuppressed patient.  History of hypothyroidism  Smoker: She was recently started on Chantix.  She is planning to quit smoking prior to undergoing gastric bypass.  Orders: Orders Placed This Encounter  Procedures   COMPLETE METABOLIC PANEL WITH GFR   CBC with Differential/Platelet   Lipid panel   No orders of the defined types were placed in this encounter.   Follow-Up Instructions: No follow-ups on file.   Gearldine Bienenstock, PA-C  Note - This record has been  created using Dragon software.  Chart creation errors have been sought, but may not always  have been located. Such creation errors do not reflect on  the standard of medical care.

## 2023-04-13 ENCOUNTER — Other Ambulatory Visit: Payer: Self-pay

## 2023-04-14 ENCOUNTER — Other Ambulatory Visit: Payer: Self-pay | Admitting: Physician Assistant

## 2023-04-14 ENCOUNTER — Other Ambulatory Visit (HOSPITAL_COMMUNITY): Payer: Self-pay | Admitting: Pharmacy Technician

## 2023-04-14 ENCOUNTER — Other Ambulatory Visit (HOSPITAL_COMMUNITY): Payer: Self-pay

## 2023-04-14 DIAGNOSIS — M0579 Rheumatoid arthritis with rheumatoid factor of multiple sites without organ or systems involvement: Secondary | ICD-10-CM

## 2023-04-14 NOTE — Telephone Encounter (Signed)
 Last Fill: 01/18/2023  Labs: 12/10/22-creatinine was 0.67, GFR 116, AST 19, ALT 30, white blood cell count 5.5, hemoglobin 12.3, platelets 365K.   TB Gold: 01/18/2023 Neg   Next Visit: 04/22/2023  Last Visit: 01/18/2023  IK:Myzlfjunpi arthritis involving multiple sites with positive rheumatoid factor   Current Dose per office note 01/18/2023:  Rinvoq  15 mg 1 tablet by mouth daily.   Patient to update labs at upcoming appointment on 04/22/2023  Okay to refill Rinvoq ?

## 2023-04-14 NOTE — Progress Notes (Signed)
 Specialty Pharmacy Refill Coordination Note  Lori Valentine is a 38 y.o. female contacted today regarding refills of specialty medication(s) Upadacitinib  (Rinvoq )   Patient requested Delivery   Delivery date: 04/19/23   Verified address: [address], [address]  [place]   Medication will be filled on 04/18/23.  Refill request sent to MD; call if any delays

## 2023-04-15 ENCOUNTER — Other Ambulatory Visit: Payer: Self-pay

## 2023-04-15 MED ORDER — RINVOQ 15 MG PO TB24
1.0000 | ORAL_TABLET | Freq: Every day | ORAL | 0 refills | Status: DC
  Filled 2023-04-15 – 2023-04-18 (×2): qty 30, 30d supply, fill #0

## 2023-04-18 ENCOUNTER — Other Ambulatory Visit: Payer: Self-pay

## 2023-04-18 ENCOUNTER — Other Ambulatory Visit (HOSPITAL_COMMUNITY): Payer: Self-pay

## 2023-04-21 ENCOUNTER — Other Ambulatory Visit: Payer: Self-pay

## 2023-04-22 ENCOUNTER — Encounter: Payer: Self-pay | Admitting: Physician Assistant

## 2023-04-22 ENCOUNTER — Ambulatory Visit: Payer: BC Managed Care – PPO | Attending: Physician Assistant | Admitting: Physician Assistant

## 2023-04-22 VITALS — BP 137/89 | HR 85 | Resp 14 | Ht 67.0 in | Wt 325.0 lb

## 2023-04-22 DIAGNOSIS — M0579 Rheumatoid arthritis with rheumatoid factor of multiple sites without organ or systems involvement: Secondary | ICD-10-CM | POA: Diagnosis not present

## 2023-04-22 DIAGNOSIS — Z79899 Other long term (current) drug therapy: Secondary | ICD-10-CM

## 2023-04-22 DIAGNOSIS — F17200 Nicotine dependence, unspecified, uncomplicated: Secondary | ICD-10-CM

## 2023-04-22 DIAGNOSIS — B002 Herpesviral gingivostomatitis and pharyngotonsillitis: Secondary | ICD-10-CM

## 2023-04-22 DIAGNOSIS — E7800 Pure hypercholesterolemia, unspecified: Secondary | ICD-10-CM | POA: Diagnosis not present

## 2023-04-22 DIAGNOSIS — Z87891 Personal history of nicotine dependence: Secondary | ICD-10-CM

## 2023-04-22 DIAGNOSIS — F419 Anxiety disorder, unspecified: Secondary | ICD-10-CM

## 2023-04-22 DIAGNOSIS — Z8639 Personal history of other endocrine, nutritional and metabolic disease: Secondary | ICD-10-CM

## 2023-04-22 DIAGNOSIS — F32A Depression, unspecified: Secondary | ICD-10-CM

## 2023-04-22 DIAGNOSIS — M940 Chondrocostal junction syndrome [Tietze]: Secondary | ICD-10-CM | POA: Diagnosis not present

## 2023-04-26 DIAGNOSIS — G4733 Obstructive sleep apnea (adult) (pediatric): Secondary | ICD-10-CM | POA: Diagnosis not present

## 2023-04-28 DIAGNOSIS — G4734 Idiopathic sleep related nonobstructive alveolar hypoventilation: Secondary | ICD-10-CM | POA: Diagnosis not present

## 2023-04-28 DIAGNOSIS — G4733 Obstructive sleep apnea (adult) (pediatric): Secondary | ICD-10-CM | POA: Diagnosis not present

## 2023-05-04 DIAGNOSIS — G4733 Obstructive sleep apnea (adult) (pediatric): Secondary | ICD-10-CM | POA: Diagnosis not present

## 2023-05-04 DIAGNOSIS — R7303 Prediabetes: Secondary | ICD-10-CM | POA: Diagnosis not present

## 2023-05-04 DIAGNOSIS — E039 Hypothyroidism, unspecified: Secondary | ICD-10-CM | POA: Diagnosis not present

## 2023-05-10 ENCOUNTER — Other Ambulatory Visit: Payer: Self-pay

## 2023-05-13 ENCOUNTER — Other Ambulatory Visit (HOSPITAL_COMMUNITY): Payer: Self-pay

## 2023-05-16 ENCOUNTER — Other Ambulatory Visit: Payer: Self-pay

## 2023-05-16 ENCOUNTER — Other Ambulatory Visit (HOSPITAL_COMMUNITY): Payer: Self-pay

## 2023-05-16 ENCOUNTER — Other Ambulatory Visit: Payer: Self-pay | Admitting: Rheumatology

## 2023-05-16 DIAGNOSIS — M0579 Rheumatoid arthritis with rheumatoid factor of multiple sites without organ or systems involvement: Secondary | ICD-10-CM

## 2023-05-16 NOTE — Progress Notes (Signed)
 LVM; Refill request denied Patient should contact provider first.

## 2023-05-16 NOTE — Progress Notes (Signed)
 Specialty Pharmacy Refill Coordination Note  Lori Valentine is a 38 y.o. female contacted today regarding refills of specialty medication(s) Upadacitinib  (Rinvoq )   Patient requested Delivery   Delivery date: 05/19/23   Verified address: [address], [address]  [name]   Medication will be filled on 02.12.25.   This fill date is pending response to refill request from provider. Patient is aware and if they have not received fill by intended date they must follow up with pharmacy.

## 2023-05-25 DIAGNOSIS — F331 Major depressive disorder, recurrent, moderate: Secondary | ICD-10-CM | POA: Diagnosis not present

## 2023-05-25 DIAGNOSIS — E039 Hypothyroidism, unspecified: Secondary | ICD-10-CM | POA: Diagnosis not present

## 2023-05-25 DIAGNOSIS — F4312 Post-traumatic stress disorder, chronic: Secondary | ICD-10-CM | POA: Diagnosis not present

## 2023-05-25 DIAGNOSIS — G4733 Obstructive sleep apnea (adult) (pediatric): Secondary | ICD-10-CM | POA: Diagnosis not present

## 2023-05-25 DIAGNOSIS — R7303 Prediabetes: Secondary | ICD-10-CM | POA: Diagnosis not present

## 2023-05-25 DIAGNOSIS — F17210 Nicotine dependence, cigarettes, uncomplicated: Secondary | ICD-10-CM | POA: Diagnosis not present

## 2023-05-25 DIAGNOSIS — F909 Attention-deficit hyperactivity disorder, unspecified type: Secondary | ICD-10-CM | POA: Diagnosis not present

## 2023-06-08 ENCOUNTER — Other Ambulatory Visit (HOSPITAL_COMMUNITY): Payer: Self-pay

## 2023-06-13 ENCOUNTER — Other Ambulatory Visit: Payer: Self-pay

## 2023-06-23 ENCOUNTER — Other Ambulatory Visit (HOSPITAL_COMMUNITY): Payer: Self-pay

## 2023-06-23 ENCOUNTER — Other Ambulatory Visit: Payer: Self-pay

## 2023-06-23 MED ORDER — WEGOVY 0.25 MG/0.5ML ~~LOC~~ SOAJ
0.2500 mg | SUBCUTANEOUS | 0 refills | Status: DC
Start: 2023-06-22 — End: 2023-07-27
  Filled 2023-06-23 – 2023-07-01 (×3): qty 2, 28d supply, fill #0

## 2023-07-01 ENCOUNTER — Other Ambulatory Visit (HOSPITAL_COMMUNITY): Payer: Self-pay

## 2023-07-15 ENCOUNTER — Other Ambulatory Visit: Payer: Self-pay

## 2023-07-26 NOTE — Progress Notes (Unsigned)
 Office Visit Note  Patient: Lori Valentine             Date of Birth: [birth year]           MRN: [id number]             PCP: Carey Chapman, MD Referring: Carey Chapman, MD Visit Date: 07/27/2023 Occupation: @GUAROCC @  Subjective:  Pain in both elbows and hands   History of Present Illness: Lori Valentine is a 38 y.o. female with seropositive rheumatoid arthritis.  She returns today after her last visit in January 2025.  She remains on Rinvoq  15 mg p.o. daily.  Patient states she has been taking Rinvoq  on a regular basis since October.  Although she has missed several doses before due to infections.  She has been having increased pain and discomfort in her both elbows and her both hands.  She has also noticed nodules over her elbows.  He also has discomfort in both shoulders.  None of the other joints are painful or swollen.  She has been under a lot of stress recently.  Patient states that she lost her job and also lost one of her best friends.    Activities of Daily Living:  Patient reports morning stiffness for all day  Patient Reports nocturnal pain.  Difficulty dressing/grooming: Reports Difficulty climbing stairs: Denies Difficulty getting out of chair: Denies Difficulty using hands for taps, buttons, cutlery, and/or writing: Reports  Review of Systems  Constitutional:  Positive for fatigue.  HENT:  Positive for mouth sores. Negative for mouth dryness.   Eyes:  Negative for dryness.  Respiratory:  Negative for shortness of breath.   Cardiovascular:  Negative for chest pain and palpitations.  Gastrointestinal:  Positive for constipation. Negative for blood in stool and diarrhea.  Endocrine: Positive for increased urination.  Genitourinary:  Positive for involuntary urination.  Musculoskeletal:  Positive for joint pain, joint pain, joint swelling, myalgias, morning stiffness, muscle tenderness and myalgias. Negative for gait problem and muscle weakness.  Skin:  Negative for color  change, rash, hair loss and sensitivity to sunlight.  Allergic/Immunologic: Positive for susceptible to infections.  Neurological:  Negative for dizziness and headaches.  Hematological:  Negative for swollen glands.  Psychiatric/Behavioral:  Positive for sleep disturbance. Negative for depressed mood. The patient is not nervous/anxious.     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   History of anxiety and depression 10/22/2016   Smoker 10/22/2016   Rheumatoid arthritis (HCC) 05/13/2015   Hypothyroidism 07/29/2014    Past Medical History:  Diagnosis Date   Hypothyroid    Rheumatoid arthritis (HCC)    Twin gestation, 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        Multiple Gestation       MC/DA - O30.039  Concordant (< 20%), nml AFV, AGA    Discordant (>20%)**  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              DC/DA - O30.049  Concordant (<20%), nml AFV, AGA, no other comorbidities  Discordant (> 20%)**    O30.003 is extra code for discordant growth   Q 3 wks 16-32, Q 2 wks 32-delivery Q 1  wk, Fluid alternating w/ growth   20-24-28-32-36 Q 2-3 wks     Family History  Problem Relation Age of Onset   Schizophrenia Mother    Bipolar disorder Mother    Diabetes Other    Heart disease Other    Diabetes Sister    Healthy Son    Past Surgical History:  Procedure Laterality Date   CESAREAN SECTION N/A 08/04/2017   Procedure: CESAREAN SECTION;  Surgeon: Othelia Blinks, MD;  Location: [facility] B[facility] SUIT[facility];  Service: Obstetrics;  Laterality: N/A;   TUBAL LIGATION Bilateral 08/04/2017   Procedure: BILATERAL TUBAL LIGATION;   Surgeon: Othelia Blinks, MD;  Location: [facility] BIRTHING SUITES;  Service: Obstetrics;  Laterality: Bilateral;   Social History   Social History Narrative   31 year old son   Lives with  Sister and nephew and son.   Works as Sales executive at [employer] History  Administered Date(s) Administered   Influenza,inj,Quad PF,6+ Mos 05/13/2015, 12/29/2016   Influenza-Unspecified 01/12/2018   Moderna Sars-Covid-2 Vaccination 05/28/2019, 07/03/2019   Tdap 05/13/2015, 06/15/2017     Objective: Vital Signs: BP 131/81 (BP Location: Left Arm, Patient Position: Sitting, Cuff Size: Large)   Pulse 81   Ht 5\' 7"  (1.702 m)   Wt (!) 324 lb (147 kg)   BMI 50.75 kg/m    Physical Exam Vitals and nursing note reviewed.  Constitutional:      Appearance: She is well-developed.  HENT:     Head: Normocephalic and atraumatic.  Eyes:     Conjunctiva/sclera: Conjunctivae normal.  Cardiovascular:     Rate and Rhythm: Normal rate and regular rhythm.     Heart sounds: Normal heart sounds.  Pulmonary:     Effort: Pulmonary effort is normal.     Breath sounds: Normal breath sounds.  Abdominal:     General: Bowel sounds are normal.     Palpations: Abdomen is soft.  Musculoskeletal:     Cervical back: Normal range of motion.  Lymphadenopathy:     Cervical: No cervical adenopathy.  Skin:    General: Skin is warm and dry.     Capillary Refill: Capillary refill takes less than 2 seconds.  Neurological:     Mental Status: She is alert and oriented to person, place, and time.  Psychiatric:        Behavior: Behavior normal.      Musculoskeletal Exam: She had good range of motion of the cervical spine.  Right shoulder joint abduction was limited to about 90 degrees and forward flexion about 120 degrees with discomfort.  She had painful range of motion of her left shoulder joint.  Both elbow joints had tenderness and full range of motion.  Rheumatoid nodule was palpable over the right  elbow.  She had synovitis and limited range of motion of her right wrist joint.  She has synovitis over MCPs and PIPs as described below.  Hip joints could not be assessed in the seated position.  Knee joints were in good range of motion.  She had warmth and swelling over bilateral ankles.  There was no tenderness over MTPs.  CDAI Exam: CDAI Score: 32  Patient Global: 70 / 100; Provider Global: 70 / 100 Swollen: 9 ; Tender: 13  Joint Exam 07/27/2023      Right  Left  Glenohumeral   Tender   Tender  Elbow   Tender   Tender  Wrist  Swollen Tender     MCP 2  Swollen Tender     MCP 3  Swollen Tender     PIP 2 (finger)  Swollen Tender     PIP 3 (finger)  Swollen Tender  Swollen Tender  PIP 5 (finger)  Swollen Tender     Ankle  Swollen Tender  Swollen Tender     Investigation: No additional findings.  Imaging: No results found.  Recent Labs: Lab Results  Component Value Date   WBC 4.7 04/14/2022   HGB 12.8 04/14/2022   PLT 272 04/14/2022   NA 138 04/14/2022   K 4.5 04/14/2022   CL 105 04/14/2022   CO2 25 04/14/2022   GLUCOSE 90 04/14/2022   BUN 9 04/14/2022   CREATININE 0.71 04/14/2022   BILITOT 0.3 04/14/2022   ALKPHOS 253 (H) 08/04/2017   AST 16 04/14/2022   ALT 24 04/14/2022   PROT 7.3 04/14/2022   ALBUMIN 2.5 (L) 08/04/2017   CALCIUM 9.0 04/14/2022   GFRAA 132 06/16/2020   QFTBGOLD Negative 07/07/2016   QFTBGOLDPLUS NEGATIVE 01/18/2023    Speciality Comments: PPD skin test: 02/01/2020 negative.   Prior therapy: Enbrel, Cimzia , Remicade , Plaquenil , and MTX monotherapy  Procedures:  No procedures performed Allergies: Effexor  [venlafaxine ]   Assessment / Plan:     Visit Diagnoses: Rheumatoid arthritis involving multiple sites with positive rheumatoid factor (HCC) - Prior therapy: Enbrel, Cimzia , Remicade , Plaquenil , and MTX monotherapy: -Patient comes to the office today with severe flare involving multiple joints as described above.  She states that the flare  started couple of months ago and has been gradually getting worse.  She states she had no interruption in Rinvoq  since October although she missed several doses of Rinvoq  in the past due to frequent infections.  She states she has been off methotrexate  for several months and she does not want to take oral or subcutaneous methotrexate .  We had a detailed discussion regarding aggressive nature of rheumatoid arthritis and pain and swelling in multiple joints.  Different treatment options and their side effects were discussed.  I discussed adding leflunomide  to Rinvoq .  Increased immunosuppression and increased risk of infection was emphasized.  Side effects of leflunomide  were also discussed at length.  A handout was given and consent was taken.  I will also give a prednisone  taper starting at 20 mg and taper by 5 mg every week.  Side effects of prednisone  including weight gain, heart disease, hypertension, diabetes, cataracts and osteoporosis were discussed.  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   Patient was counseled on the purpose, proper use, and adverse effects of leflunomide  including risk of infection, nausea/diarrhea/weight loss, increase in blood pressure, rash, hair loss, tingling in the hands and feet, and signs and symptoms of interstitial lung disease.   Also counseled on Black Box warning of liver injury and importance of avoiding alcohol while on therapy. Discussed that there is the possibility of an increased risk of malignancy but it is not well understood if this increased risk is due to the medication or the disease state.  Counseled patient to avoid live vaccines. Recommend annual influenza, Pneumovax 23, Prevnar 13, and Shingrix as indicated.   Discussed the importance of frequent monitoring of liver function and blood count.  Standing orders placed.  Discussed importance of birth control while on leflunomide  due to risk of  congenital abnormalities, and patient confirms history of tubal ligation.  Provided patient with educational materials on leflunomide  and answered all questions.  Patient consented to Arava   use, and consent will be uploaded into the media tab.   Patient dose will be leflunomide  10 mg p.o. daily for 2 weeks and increase it to 20 mg p.o. daily after 2 weeks if the labs are normal..  Prescription pending lab results and/or insurance approval.   High risk medication use - Rinvoq  15 mg 1 tablet by mouth daily. -CBC and CMP were normal in October 2024.  Getting labs to monitor for hematoma recall, hepatic and renal toxicity every 3 months was emphasized.  TB Gold was negative in October 2024.  Will check labs today.  She was advised to get labs 2 weeks x 2 after starting Ro and then every 3 months.  Plan: CBC with Differential/Platelet, Comprehensive metabolic panel with GFR.  Information for immunization was placed in the AVS.  She was advised to hold leflunomide  and Rinvoq  if she develops an infection resume after the infection resolves.  FDA blackbox warning associated with Rinvoq  regarding adverse cardiovascular events including increased risk of cardiovascular events, MI, stroke, pulmonary embolism and DVTs were revised.  Annual skin examination to screen for skin cancer was also advised.  Use of sunscreen and sun protection was discussed.  Acute pain of both shoulders-she has been having pain and discomfort in the bilateral shoulders.  She had right frozen shoulder.  Pain of both elbows-she complains of discomfort in her bilateral elbow joints.  She had tenderness and painful range of motion of bilateral elbows.  Pain in both hands-she had synovitis over the right wrist joint and swelling over some of her MCPs and PIPs as described below.  Pain in both ankles-she informed swelling of her bilateral ankles.  Costochondritis-currently symptomatic.  Elevated LDL cholesterol level - Plan: Lipid  panel  Anxiety and depression-patient states she has been under stress as she lost her friend recently.  She also lost her job.  Recurrent oral herpes simplex - Orolabial-recurrent, immunosuppressed patient.  History of hypothyroidism  Vitamin D  deficiency -she has history of vitamin D  deficiency.  Plan: VITAMIN D  25 Hydroxy (Vit-D Deficiency, Fractures)  Screening for diabetes mellitus - Plan: Hemoglobin A1c  Smoker-association of smoking with rheumatoid arthritis was discussed.  Smoking cessation was discussed at length.  Orders: Orders Placed This Encounter  Procedures   CBC with Differential/Platelet   Comprehensive metabolic panel with GFR   Lipid panel   Hemoglobin A1c   VITAMIN D  25 Hydroxy (Vit-D Deficiency, Fractures)   Sedimentation rate   CBC with Differential/Platelet   Comprehensive metabolic panel with GFR   Meds ordered this encounter  Medications   predniSONE  (DELTASONE ) 5 MG tablet    Sig: Take 4 tablets by mouth daily x7 days, 3 tablets daily x7 days, 2 tablets daily x7 days, 1 tablet daily x7 days.    Dispense:  70 tablet    Refill:  0     Follow-Up Instructions: Return in about 2 months (around 09/26/2023) for Rheumatoid arthritis.   Nicholas Bari, MD  Note - This record has been created using Animal nutritionist.  Chart creation errors have been sought, but may not always  have been located. Such creation errors do not reflect on  the standard of medical care.

## 2023-07-27 ENCOUNTER — Telehealth: Payer: Self-pay

## 2023-07-27 ENCOUNTER — Encounter: Payer: Self-pay | Admitting: Rheumatology

## 2023-07-27 ENCOUNTER — Ambulatory Visit: Attending: Rheumatology | Admitting: Rheumatology

## 2023-07-27 VITALS — BP 131/81 | HR 81 | Ht 67.0 in | Wt 324.0 lb

## 2023-07-27 DIAGNOSIS — Z8639 Personal history of other endocrine, nutritional and metabolic disease: Secondary | ICD-10-CM | POA: Diagnosis not present

## 2023-07-27 DIAGNOSIS — Z79899 Other long term (current) drug therapy: Secondary | ICD-10-CM | POA: Diagnosis not present

## 2023-07-27 DIAGNOSIS — M0579 Rheumatoid arthritis with rheumatoid factor of multiple sites without organ or systems involvement: Secondary | ICD-10-CM | POA: Diagnosis not present

## 2023-07-27 DIAGNOSIS — B002 Herpesviral gingivostomatitis and pharyngotonsillitis: Secondary | ICD-10-CM

## 2023-07-27 DIAGNOSIS — E7800 Pure hypercholesterolemia, unspecified: Secondary | ICD-10-CM | POA: Diagnosis not present

## 2023-07-27 DIAGNOSIS — M25521 Pain in right elbow: Secondary | ICD-10-CM

## 2023-07-27 DIAGNOSIS — M25571 Pain in right ankle and joints of right foot: Secondary | ICD-10-CM

## 2023-07-27 DIAGNOSIS — M0630 Rheumatoid nodule, unspecified site: Secondary | ICD-10-CM | POA: Diagnosis not present

## 2023-07-27 DIAGNOSIS — M25572 Pain in left ankle and joints of left foot: Secondary | ICD-10-CM

## 2023-07-27 DIAGNOSIS — F32A Depression, unspecified: Secondary | ICD-10-CM

## 2023-07-27 DIAGNOSIS — E559 Vitamin D deficiency, unspecified: Secondary | ICD-10-CM

## 2023-07-27 DIAGNOSIS — M25522 Pain in left elbow: Secondary | ICD-10-CM

## 2023-07-27 DIAGNOSIS — F419 Anxiety disorder, unspecified: Secondary | ICD-10-CM | POA: Diagnosis not present

## 2023-07-27 DIAGNOSIS — M25512 Pain in left shoulder: Secondary | ICD-10-CM

## 2023-07-27 DIAGNOSIS — M25511 Pain in right shoulder: Secondary | ICD-10-CM

## 2023-07-27 DIAGNOSIS — Z131 Encounter for screening for diabetes mellitus: Secondary | ICD-10-CM | POA: Diagnosis not present

## 2023-07-27 DIAGNOSIS — M79641 Pain in right hand: Secondary | ICD-10-CM

## 2023-07-27 DIAGNOSIS — F17200 Nicotine dependence, unspecified, uncomplicated: Secondary | ICD-10-CM

## 2023-07-27 DIAGNOSIS — M940 Chondrocostal junction syndrome [Tietze]: Secondary | ICD-10-CM

## 2023-07-27 DIAGNOSIS — G8929 Other chronic pain: Secondary | ICD-10-CM

## 2023-07-27 DIAGNOSIS — M79642 Pain in left hand: Secondary | ICD-10-CM

## 2023-07-27 MED ORDER — PREDNISONE 5 MG PO TABS
ORAL_TABLET | ORAL | 0 refills | Status: AC
Start: 2023-07-27 — End: ?

## 2023-07-27 NOTE — Patient Instructions (Addendum)
 Standing Labs We placed an order today for your standing lab work.   Please have your standing labs drawn in 2 weeks x2 and then every 3 months  Please have your labs drawn 2 weeks prior to your appointment so that the provider can discuss your lab results at your appointment, if possible.  Please note that you may see your imaging and lab results in MyChart before we have reviewed them. We will contact you once all results are reviewed. Please allow our office up to 72 hours to thoroughly review all of the results before contacting the office for clarification of your results.  WALK-IN LAB HOURS  Monday through Thursday from 8:00 am -12:30 pm and 1:00 pm-5:00 pm and Friday from 8:00 am-12:00 pm.  Patients with office visits requiring labs will be seen before walk-in labs.  You may encounter longer than normal wait times. Please allow additional time. Wait times may be shorter on  Monday and Thursday afternoons.  We do not book appointments for walk-in labs. We appreciate your patience and understanding with our staff.   Labs are drawn by [facility]. Please bring your co-pay at the time of your lab draw.  You may receive a bill from [facility] for your lab work.  Please note if you are on Hydroxychloroquine  and and an order has been placed for a Hydroxychloroquine  level,  you will need to have it drawn 4 hours or more after your last dose.  If you wish to have your labs drawn at another location, please call the office 24 hours in advance so we can fax the orders.  The office is located at [address], [place], [place] [address]   If you have any questions regarding directions or hours of operation,  please call [phone].   As a reminder, please drink plenty of water prior to coming for your lab work. Thanks!   Vaccines You are taking a medication(s) that can suppress your immune system.  The following immunizations are recommended: Flu annually Covid-19  Td/Tdap (tetanus,  diphtheria, pertussis) every 10 years Pneumonia (Prevnar 15 then Pneumovax 23 at least 1 year apart.  Alternatively, can take Prevnar 20 without needing additional dose) Shingrix: 2 doses from 4 weeks to 6 months apart  Please check with your PCP to make sure you are up to date.   If you have signs or symptoms of an infection or start antibiotics: First, call your PCP for workup of your infection. Hold your medication through the infection, until you complete your antibiotics, and until symptoms resolve if you take the following: Injectable medication (Actemra, Benlysta, Cimzia , Cosentyx, Enbrel, Humira, Kevzara, Orencia, Remicade , Simponi, Stelara, Taltz, Tremfya) Methotrexate  Leflunomide  (Arava ) Mycophenolate (Cellcept) Xeljanz , Olumiant, or Rinvoq    Because you are taking Xeljanz , Rinvoq , or Olumiant, it is very important to know that this class of medications has a FDA BLACK BOX WARNING for major adverse cardiovascular events (MACE), thrombosis, mortality (including sudden cardiovascular death), serious infections, and lymphomas. MACE is defined as cardiovascular death, myocardial infarction, and stroke. Thrombosis includes deep venous thrombosis (DVT), pulmonary embolism (PE), and arterial thrombosis. If you are a current or former smoker, you are at higher risk for MACE.   Please get an annual skin examination to screen for skin cancer while you are on Rinvoq .  Please use sunscreen and sun protection.  Leflunomide  Tablets What is this medication? LEFLUNOMIDE  (le FLOO na mide) treats the symptoms of rheumatoid arthritis. It works by slowing down an overactive immune  system. This decreases inflammation. It belongs to a group of medications called DMARDs. This medicine may be used for other purposes; ask your health care provider or pharmacist if you have questions. COMMON BRAND NAME(S): Arava  What should I tell my care team before I take this medication? They need to know if you have any  of these conditions: Cancer Diabetes High blood pressure Immune system problems Infection Kidney disease Liver disease Low blood cell levels (white cells, red cells, and platelets) Lung or breathing disease, such as asthma or COPD Recent or upcoming vaccine Skin conditions Tingling of the fingers or toes, or other nerve disorder An unusual or allergic reaction to leflunomide , other medications, food, dyes, or preservatives Pregnant or trying to get pregnant Breastfeeding How should I use this medication? Take this medication by mouth with a full glass of water. Take it as directed on the prescription label at the same time every day. Keep taking it unless your care team tells you to stop. Talk to your care team about the use of this medication in children. Special care may be needed. Overdosage: If you think you have taken too much of this medicine contact a poison control center or emergency room at once. NOTE: This medicine is only for you. Do not share this medicine with others. What if I miss a dose? If you miss a dose, take it as soon as you can. If it is almost time for your next dose, take only that dose. Do not take double or extra doses. What may interact with this medication? Do not take this medication with any of the following: Teriflunomide This medication may also interact with the following: Alosetron Caffeine Cefaclor Certain medications for diabetes, such as nateglinide, repaglinide, rosiglitazone, pioglitazone Certain medications for high cholesterol, such as atorvastatin, pravastatin, rosuvastatin, simvastatin Charcoal Cholestyramine Ciprofloxacin Duloxetine Estrogen and progestin hormones Furosemide  Ketoprofen Live virus vaccines Medications that increase your risk for infection Methotrexate  Mitoxantrone Paclitaxel Penicillin Theophylline Tizanidine Warfarin This list may not describe all possible interactions. Give your health care provider a list  of all the medicines, herbs, non-prescription drugs, or dietary supplements you use. Also tell them if you smoke, drink alcohol, or use illegal drugs. Some items may interact with your medicine. What should I watch for while using this medication? Visit your care team for regular checks on your progress. Tell your care team if your symptoms do not start to get better or if they get worse. You may need blood work done while you are taking this medication. This medication may cause serious skin reactions. They can happen weeks to months after starting the medication. Contact your care team right away if you notice fevers or flu-like symptoms with a rash. The rash may be red or purple and then turn into blisters or peeling of the skin. You may also notice a red rash with swelling of the face, lips, or lymph nodes in your neck or under your arms. You should not receive certain vaccines during your treatment and for a certain time after your treatment with this medication ends. Talk to your care team for more information. This medication may stay in your body for up to 2 years after your last dose. Tell your care team about any unusual side effects or symptoms. A medication can be given to help lower your blood levels of this medication more quickly. Talk to your care team if you may be pregnant. This medication can cause serious birth defects if taken during pregnancy  and for a while after the last dose. You will need a negative pregnancy test before starting this medication. Contraception is recommended while taking this medication and for a while after the last dose. Your care team can help you find the option that works for you. Do not breastfeed while taking this medication. What side effects may I notice from receiving this medication? Side effects that you should report to your care team as soon as possible: Allergic reactions--skin rash, itching, hives, swelling of the face, lips, tongue, or throat Dry  cough, shortness of breath or trouble breathing Increase in blood pressure Infection--fever, chills, cough, sore throat, wounds that don't heal, pain or trouble when passing urine, general feeling of discomfort or being unwell Redness, blistering, peeling, or loosening of the skin, including inside the mouth Liver injury--right upper belly pain, loss of appetite, nausea, light-colored stool, dark yellow or brown urine, yellowing skin or eyes, unusual weakness or fatigue Pain, tingling, or numbness in the hands or feet Unusual bruising or bleeding Side effects that usually do not require medical attention (report to your care team if they continue or are bothersome): Back pain Diarrhea Hair loss Headache Nausea This list may not describe all possible side effects. Call your doctor for medical advice about side effects. You may report side effects to FDA at 1-800-FDA-1088. Where should I keep my medication? Keep out of the reach of children and pets. Store at room temperature between 20 and 25 degrees C (68 and 77 degrees F). Protect from moisture and light. Keep the container tightly closed. Get rid of any unused medication after the expiration date. To get rid of medications that are no longer needed or have expired: Take the medication to a medication take-back program. Check with your pharmacy or law enforcement to find a location. If you cannot return the medication, ask your pharmacist or care team how to get rid of this medication safely. NOTE: This sheet is a summary. It may not cover all possible information. If you have questions about this medicine, talk to your doctor, pharmacist, or health care provider.  2024 Elsevier/Gold Standard (2021-08-20 00:00:00)

## 2023-07-27 NOTE — Telephone Encounter (Signed)
 Pending lab results, patient will be starting Arava  per Dr. Alvira Josephs.  Arava  10mg  every day x2 weeks then increase to 20mg  daily pending stable labs.  Consent obtained and sent to the scan center. Patient's preferred pharmacy is [facility].   Please send lab results to Raguel Bunkers, FNP at [facility] per patient's request. Thanks!

## 2023-07-28 ENCOUNTER — Encounter: Payer: Self-pay | Admitting: Rheumatology

## 2023-07-28 ENCOUNTER — Encounter: Payer: Self-pay | Admitting: *Deleted

## 2023-07-28 LAB — COMPREHENSIVE METABOLIC PANEL WITH GFR
AG Ratio: 1.4 (calc) (ref 1.0–2.5)
ALT: 37 U/L — ABNORMAL HIGH (ref 6–29)
AST: 22 U/L (ref 10–30)
Albumin: 3.9 g/dL (ref 3.6–5.1)
Alkaline phosphatase (APISO): 111 U/L (ref 31–125)
BUN: 15 mg/dL (ref 7–25)
CO2: 27 mmol/L (ref 20–32)
Calcium: 8.6 mg/dL (ref 8.6–10.2)
Chloride: 105 mmol/L (ref 98–110)
Creat: 0.59 mg/dL (ref 0.50–0.97)
Globulin: 2.8 g/dL (ref 1.9–3.7)
Glucose, Bld: 104 mg/dL — ABNORMAL HIGH (ref 65–99)
Potassium: 4 mmol/L (ref 3.5–5.3)
Sodium: 140 mmol/L (ref 135–146)
Total Bilirubin: 0.2 mg/dL (ref 0.2–1.2)
Total Protein: 6.7 g/dL (ref 6.1–8.1)
eGFR: 119 mL/min/{1.73_m2} (ref >=60)

## 2023-07-28 LAB — CBC WITH DIFFERENTIAL/PLATELET
Absolute Lymphocytes: 1521 {cells}/uL (ref 850–3900)
Absolute Monocytes: 329 {cells}/uL (ref 200–950)
Basophils Absolute: 11 {cells}/uL (ref 0–200)
Basophils Relative: 0.2 %
Eosinophils Absolute: 90 {cells}/uL (ref 15–500)
Eosinophils Relative: 1.7 %
HCT: 37.9 % (ref 35.0–45.0)
Hemoglobin: 12.4 g/dL (ref 11.7–15.5)
MCH: 28.4 pg (ref 27.0–33.0)
MCHC: 32.7 g/dL (ref 32.0–36.0)
MCV: 86.9 fL (ref 80.0–100.0)
MPV: 9.7 fL (ref 7.5–12.5)
Monocytes Relative: 6.2 %
Neutro Abs: 3350 {cells}/uL (ref 1500–7800)
Neutrophils Relative %: 63.2 %
Platelets: 282 10*3/uL (ref 140–400)
RBC: 4.36 10*6/uL (ref 3.80–5.10)
RDW: 13.5 % (ref 11.0–15.0)
Total Lymphocyte: 28.7 %
WBC: 5.3 10*3/uL (ref 3.8–10.8)

## 2023-07-28 LAB — LIPID PANEL
Cholesterol: 191 mg/dL (ref <200)
HDL: 31 mg/dL — ABNORMAL LOW (ref >=50)
LDL Cholesterol (Calc): 120 mg/dL — ABNORMAL HIGH
Non-HDL Cholesterol (Calc): 160 mg/dL — ABNORMAL HIGH (ref <130)
Total CHOL/HDL Ratio: 6.2 (calc) — ABNORMAL HIGH (ref <5.0)
Triglycerides: 259 mg/dL — ABNORMAL HIGH (ref <150)

## 2023-07-28 LAB — SEDIMENTATION RATE: Sed Rate: 29 mm/h — ABNORMAL HIGH (ref 0–20)

## 2023-07-28 LAB — HEMOGLOBIN A1C
Hgb A1c MFr Bld: 5.7 % — ABNORMAL HIGH (ref <5.7)
Mean Plasma Glucose: 117 mg/dL
eAG (mmol/L): 6.5 mmol/L

## 2023-07-28 LAB — VITAMIN D 25 HYDROXY (VIT D DEFICIENCY, FRACTURES): Vit D, 25-Hydroxy: 31 ng/mL (ref 30–100)

## 2023-07-28 MED ORDER — LEFLUNOMIDE 10 MG PO TABS: ORAL_TABLET | ORAL | 0 refills | Status: DC

## 2023-07-28 NOTE — Progress Notes (Signed)
 CBC normal, vitamin D  31 which is in the desirable range.  Sed rate 29 which is mildly elevated indicating inflammation.  Hemoglobin A1c slightly elevated at 5.7.  Triglycerides and LDL are elevated.  CMP shows mildly elevated LFTs.  Please forward results to her PCP.

## 2023-08-23 ENCOUNTER — Other Ambulatory Visit: Payer: Self-pay | Admitting: Rheumatology

## 2023-08-23 DIAGNOSIS — M0579 Rheumatoid arthritis with rheumatoid factor of multiple sites without organ or systems involvement: Secondary | ICD-10-CM

## 2023-08-24 DIAGNOSIS — F909 Attention-deficit hyperactivity disorder, unspecified type: Secondary | ICD-10-CM | POA: Diagnosis not present

## 2023-08-24 DIAGNOSIS — F4310 Post-traumatic stress disorder, unspecified: Secondary | ICD-10-CM | POA: Diagnosis not present

## 2023-08-24 DIAGNOSIS — F331 Major depressive disorder, recurrent, moderate: Secondary | ICD-10-CM | POA: Diagnosis not present

## 2023-08-26 ENCOUNTER — Other Ambulatory Visit (HOSPITAL_COMMUNITY): Payer: Self-pay

## 2023-08-29 ENCOUNTER — Other Ambulatory Visit: Payer: Self-pay | Admitting: Rheumatology

## 2023-08-30 ENCOUNTER — Telehealth: Payer: Self-pay | Admitting: Rheumatology

## 2023-08-30 NOTE — Telephone Encounter (Signed)
 I called patient, patient has not started Arava  due to an ulcer.

## 2023-08-30 NOTE — Telephone Encounter (Signed)
 Pt stated she received a phone call from someone in our office and was returning the call. Pt stated that there was no voice message left on her machine.

## 2023-08-30 NOTE — Telephone Encounter (Signed)
 Last Fill: 07/28/2023  Labs: 07/27/2023  CBC normal, vitamin D  31 which is in the desirable range.  Sed rate 29 which is mildly elevated indicating inflammation.  Hemoglobin A1c slightly elevated at 5.7.  Triglycerides and LDL are elevated.  CMP shows mildly elevated LFTs.   Next Visit: 09/26/2023  Last Visit: 07/27/2023  DX: Rheumatoid arthritis involving multiple sites with positive rheumatoid factor   Current Dose per office note 07/27/2023: leflunomide  10 mg p.o. daily for 2 weeks and increase it to 20 mg p.o. daily after 2 weeks if the labs are normal   Okay to refill Arava  ?

## 2023-09-06 ENCOUNTER — Other Ambulatory Visit: Payer: Self-pay

## 2023-09-07 ENCOUNTER — Other Ambulatory Visit: Payer: Self-pay | Admitting: Rheumatology

## 2023-09-07 ENCOUNTER — Other Ambulatory Visit: Payer: Self-pay

## 2023-09-07 DIAGNOSIS — M0579 Rheumatoid arthritis with rheumatoid factor of multiple sites without organ or systems involvement: Secondary | ICD-10-CM

## 2023-09-07 MED ORDER — RINVOQ 15 MG PO TB24
1.0000 | ORAL_TABLET | Freq: Every day | ORAL | 2 refills | Status: DC
Start: 2023-09-07 — End: 2023-12-09
  Filled 2023-09-07: qty 30, 30d supply, fill #0
  Filled 2023-09-30 – 2023-10-18 (×3): qty 30, 30d supply, fill #1
  Filled 2023-11-11 – 2023-11-18 (×2): qty 30, 30d supply, fill #2

## 2023-09-07 NOTE — Telephone Encounter (Signed)
 Last Fill: 04/15/2023  Labs: 07/27/2023  CBC normal, vitamin D  31 which is in the desirable range.  Sed rate 29 which is mildly elevated indicating inflammation.  Hemoglobin A1c slightly elevated at 5.7.  Triglycerides and LDL are elevated.  CMP shows mildly elevated LFTs   TB Gold: 01/18/2023   Next Visit: 09/26/2023  Last Visit: 07/27/2023  MW:UXLKGMWNUU arthritis involving multiple sites with positive rheumatoid factor   Current Dose per office note 07/27/2023: Rinvoq  15 mg 1 tablet by mouth daily.   Okay to refill Rinvoq ?

## 2023-09-07 NOTE — Progress Notes (Signed)
 Rx for Rinvoq  sent to [facility]. Thank you

## 2023-09-07 NOTE — Progress Notes (Signed)
 Specialty Pharmacy Refill Coordination Note  Lori Valentine is a 38 y.o. female contacted today regarding refills of specialty medication(s) Upadacitinib  (Rinvoq )   Patient requested (Patient-Rptd) Delivery   Delivery date: (Patient-Rptd) 09/09/23   Verified address: (Patient-Rptd) [address],  [address] [place], [place] [address]   Medication will be filled on 09/08/23.     Patient was seen 07/27/23, but refills were never sent. Routed to Rheum/Pulm for assistance.

## 2023-09-08 ENCOUNTER — Other Ambulatory Visit: Payer: Self-pay

## 2023-09-08 ENCOUNTER — Telehealth: Payer: Self-pay

## 2023-09-08 ENCOUNTER — Other Ambulatory Visit (HOSPITAL_COMMUNITY): Payer: Self-pay

## 2023-09-08 NOTE — Telephone Encounter (Signed)
 Received notification from [facility] pharmacy that patient requires a new authorization for their medication.  Submitted a Prior Authorization request to [facility] for RINVOQ  via CoverMyMeds. Authorization has been APPROVED from 09/08/2023 to 09/07/2024. Approval letter sent to scan center.   CMM Key: [id number] Authorization# [id number]

## 2023-09-08 NOTE — Progress Notes (Signed)
 Prior Auth Required for Rinvoq . Encounter routed to Rx Rheum/Pulm Pool

## 2023-09-12 NOTE — Progress Notes (Deleted)
 Office Visit Note  Patient: Lori Valentine             Date of Birth: 02/21/1986           MRN: [id number]             PCP: Carey Chapman, MD Referring: Carey Chapman, MD Visit Date: 09/26/2023 Occupation: @GUAROCC @  Subjective:  No chief complaint on file.   History of Present Illness: Lori Valentine is a 38 y.o. female ***     Activities of Daily Living:  Patient reports morning stiffness for *** {minute/hour:19697}.   Patient {ACTIONS;DENIES/REPORTS:21021675::"Denies"} nocturnal pain.  Difficulty dressing/grooming: {ACTIONS;DENIES/REPORTS:21021675::"Denies"} Difficulty climbing stairs: {ACTIONS;DENIES/REPORTS:21021675::"Denies"} Difficulty getting out of chair: {ACTIONS;DENIES/REPORTS:21021675::"Denies"} Difficulty using hands for taps, buttons, cutlery, and/or writing: {ACTIONS;DENIES/REPORTS:21021675::"Denies"}  No Rheumatology ROS completed.   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   History of anxiety and depression 10/22/2016   Smoker 10/22/2016   Rheumatoid arthritis (HCC) 05/13/2015   Hypothyroidism 07/29/2014    Past Medical History:  Diagnosis Date   Hypothyroid    Rheumatoid arthritis (HCC)    Twin gestation, 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        Multiple Gestation       MC/DA - O30.039  Concordant (< 20%), nml AFV, AGA    Discordant (>20%)**  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              DC/DA - O30.049  Concordant (<20%), nml AFV, AGA, no other comorbidities  Discordant (> 20%)**    O30.003 is extra code for discordant growth   Q 3 wks 16-32, Q 2 wks 32-delivery Q 1 wk, Fluid  alternating w/ growth   20-24-28-32-36 Q 2-3 wks     Family History  Problem Relation Age of Onset   Schizophrenia Mother    Bipolar disorder Mother    Diabetes Other    Heart disease Other    Diabetes Sister    Healthy Son    Past Surgical History:  Procedure Laterality Date   CESAREAN SECTION N/A 08/04/2017   Procedure: CESAREAN SECTION;  Surgeon: Othelia Blinks, MD;  Location: [facility] BIRTHING SUITES;  Service: Obstetrics;  Laterality: N/A;   TUBAL LIGATION Bilateral 08/04/2017   Procedure: BILATERAL TUBAL LIGATION;  Surgeon: Othelia Blinks, MD;  Location: [facility] BIRTHING SUITES;  Service: Obstetrics;  Laterality: Bilateral;   Social History   Social History Narrative   50 year old son   Lives with  Sister and nephew and son.   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   Physical Exam   Musculoskeletal Exam: ***  CDAI Exam: CDAI Score: -- Patient Global: --; Provider Global: -- Swollen: --; Tender: -- Joint Exam 09/26/2023   No joint exam has been documented for this visit   There is currently no information documented on the homunculus. Go to the Rheumatology activity and complete the homunculus joint exam.  Investigation: No additional findings.  Imaging: No results found.  Recent Labs: Lab Results  Component Value Date   WBC 5.3 07/27/2023   HGB 12.4 07/27/2023   PLT 282 07/27/2023   NA 140 07/27/2023   K 4.0 07/27/2023   CL 105 07/27/2023   CO2 27 07/27/2023   GLUCOSE 104 (H) 07/27/2023   BUN 15 07/27/2023   CREATININE 0.59 07/27/2023   BILITOT 0.2 07/27/2023   ALKPHOS 253 (H) 08/04/2017   AST 22 07/27/2023   ALT 37 (H) 07/27/2023   PROT 6.7  07/27/2023   ALBUMIN 2.5 (L) 08/04/2017   CALCIUM 8.6 07/27/2023   GFRAA 132 06/16/2020   QFTBGOLD Negative 07/07/2016   QFTBGOLDPLUS NEGATIVE 01/18/2023    Speciality Comments: PPD skin test: 02/01/2020 negative.   Prior therapy: Enbrel, Cimzia , Remicade , Plaquenil , and MTX monotherapy  Procedures:  No procedures performed Allergies: Effexor  [venlafaxine ]   Assessment / Plan:     Visit Diagnoses: Rheumatoid arthritis involving multiple sites with positive rheumatoid factor (HCC)  High risk medication use  Acute pain of both shoulders  Pain of both elbows  Pain in both hands  Chronic pain of both ankles  Rheumatoid nodulosis (HCC)  Costochondritis  Elevated LDL cholesterol level  Anxiety and depression  Recurrent oral herpes simplex  History of hypothyroidism  Vitamin D  deficiency  Former smoker  Orders: No orders of the defined types were placed in this encounter.  No orders of the defined types were placed in this encounter.   Face-to-face time spent with patient was *** minutes. Greater than 50% of time was spent in counseling and coordination of care.  Follow-Up Instructions: No follow-ups on file.   Romayne Clubs, PA-C  Note - This record has been created using Dragon software.  Chart creation errors have been sought, but may not always  have been located. Such creation errors do not reflect on  the standard of medical care.

## 2023-09-21 ENCOUNTER — Other Ambulatory Visit: Payer: Self-pay

## 2023-09-26 ENCOUNTER — Ambulatory Visit: Admitting: Physician Assistant

## 2023-09-26 DIAGNOSIS — M79641 Pain in right hand: Secondary | ICD-10-CM

## 2023-09-26 DIAGNOSIS — B002 Herpesviral gingivostomatitis and pharyngotonsillitis: Secondary | ICD-10-CM

## 2023-09-26 DIAGNOSIS — M25521 Pain in right elbow: Secondary | ICD-10-CM

## 2023-09-26 DIAGNOSIS — M0630 Rheumatoid nodule, unspecified site: Secondary | ICD-10-CM

## 2023-09-26 DIAGNOSIS — E7800 Pure hypercholesterolemia, unspecified: Secondary | ICD-10-CM

## 2023-09-26 DIAGNOSIS — Z8639 Personal history of other endocrine, nutritional and metabolic disease: Secondary | ICD-10-CM

## 2023-09-26 DIAGNOSIS — F419 Anxiety disorder, unspecified: Secondary | ICD-10-CM

## 2023-09-26 DIAGNOSIS — Z87891 Personal history of nicotine dependence: Secondary | ICD-10-CM

## 2023-09-26 DIAGNOSIS — G8929 Other chronic pain: Secondary | ICD-10-CM

## 2023-09-26 DIAGNOSIS — M0579 Rheumatoid arthritis with rheumatoid factor of multiple sites without organ or systems involvement: Secondary | ICD-10-CM

## 2023-09-26 DIAGNOSIS — E559 Vitamin D deficiency, unspecified: Secondary | ICD-10-CM

## 2023-09-26 DIAGNOSIS — M940 Chondrocostal junction syndrome [Tietze]: Secondary | ICD-10-CM

## 2023-09-26 DIAGNOSIS — Z79899 Other long term (current) drug therapy: Secondary | ICD-10-CM

## 2023-09-26 DIAGNOSIS — M25511 Pain in right shoulder: Secondary | ICD-10-CM

## 2023-09-30 ENCOUNTER — Other Ambulatory Visit: Payer: Self-pay

## 2023-10-03 ENCOUNTER — Other Ambulatory Visit: Payer: Self-pay

## 2023-10-05 ENCOUNTER — Other Ambulatory Visit (HOSPITAL_COMMUNITY): Payer: Self-pay

## 2023-10-18 ENCOUNTER — Other Ambulatory Visit: Payer: Self-pay

## 2023-10-18 ENCOUNTER — Other Ambulatory Visit: Payer: Self-pay | Admitting: Pharmacy Technician

## 2023-10-18 ENCOUNTER — Other Ambulatory Visit (HOSPITAL_COMMUNITY): Payer: Self-pay

## 2023-10-18 NOTE — Progress Notes (Signed)
 Specialty Pharmacy Refill Coordination Note  Lori Valentine is a 38 y.o. female contacted today regarding refills of specialty medication(s) Upadacitinib  (Rinvoq )   Patient requested Delivery   Delivery date: 10/20/23   Verified address: [address], [address]  [place] [address]   Medication will be filled on 10/19/23.

## 2023-10-18 NOTE — Progress Notes (Signed)
 Specialty Pharmacy Ongoing Clinical Assessment Note  Lori Valentine is a 38 y.o. female who is being followed by the specialty pharmacy service for RxSp Rheumatoid Arthritis   Patient's specialty medication(s) reviewed today: Upadacitinib  (Rinvoq )   Missed doses in the last 4 weeks: 0   Patient/Caregiver did not have any additional questions or concerns.   Therapeutic benefit summary: Patient is achieving benefit   Adverse events/side effects summary: No adverse events/side effects   Patient's therapy is appropriate to: Continue    Goals Addressed             This Visit's Progress    Reduce inflammation   On track    Patient is on track. Patient will maintain adherence         Follow up: 12 months  Silvano LOISE Dolly Specialty Pharmacist

## 2023-10-19 DIAGNOSIS — F331 Major depressive disorder, recurrent, moderate: Secondary | ICD-10-CM | POA: Diagnosis not present

## 2023-10-19 DIAGNOSIS — F4312 Post-traumatic stress disorder, chronic: Secondary | ICD-10-CM | POA: Diagnosis not present

## 2023-10-19 DIAGNOSIS — F909 Attention-deficit hyperactivity disorder, unspecified type: Secondary | ICD-10-CM | POA: Diagnosis not present

## 2023-11-02 DIAGNOSIS — G4733 Obstructive sleep apnea (adult) (pediatric): Secondary | ICD-10-CM | POA: Diagnosis not present

## 2023-11-10 DIAGNOSIS — E559 Vitamin D deficiency, unspecified: Secondary | ICD-10-CM | POA: Diagnosis not present

## 2023-11-10 DIAGNOSIS — F17210 Nicotine dependence, cigarettes, uncomplicated: Secondary | ICD-10-CM | POA: Diagnosis not present

## 2023-11-10 DIAGNOSIS — Z6841 Body Mass Index (BMI) 40.0 and over, adult: Secondary | ICD-10-CM | POA: Diagnosis not present

## 2023-11-10 DIAGNOSIS — R7303 Prediabetes: Secondary | ICD-10-CM | POA: Diagnosis not present

## 2023-11-10 DIAGNOSIS — G4733 Obstructive sleep apnea (adult) (pediatric): Secondary | ICD-10-CM | POA: Diagnosis not present

## 2023-11-10 DIAGNOSIS — E039 Hypothyroidism, unspecified: Secondary | ICD-10-CM | POA: Diagnosis not present

## 2023-11-10 DIAGNOSIS — E66813 Obesity, class 3: Secondary | ICD-10-CM | POA: Diagnosis not present

## 2023-11-11 ENCOUNTER — Other Ambulatory Visit: Payer: Self-pay

## 2023-11-15 ENCOUNTER — Other Ambulatory Visit: Payer: Self-pay

## 2023-11-18 ENCOUNTER — Other Ambulatory Visit: Payer: Self-pay | Admitting: Pharmacy Technician

## 2023-11-18 ENCOUNTER — Other Ambulatory Visit: Payer: Self-pay

## 2023-11-18 NOTE — Progress Notes (Signed)
 Specialty Pharmacy Refill Coordination Note  Lori Valentine is a 38 y.o. female contacted today regarding refills of specialty medication(s) Upadacitinib (Rinvoq)   Patient requested Delivery   Delivery date: 11/21/23   Verified address: [address], [address]   [place] [address]   Medication will be filled on 11/18/23.

## 2023-11-22 DIAGNOSIS — G4733 Obstructive sleep apnea (adult) (pediatric): Secondary | ICD-10-CM | POA: Diagnosis not present

## 2023-11-24 DIAGNOSIS — E559 Vitamin D deficiency, unspecified: Secondary | ICD-10-CM | POA: Diagnosis not present

## 2023-11-24 DIAGNOSIS — R110 Nausea: Secondary | ICD-10-CM | POA: Diagnosis not present

## 2023-11-24 DIAGNOSIS — G4733 Obstructive sleep apnea (adult) (pediatric): Secondary | ICD-10-CM | POA: Diagnosis not present

## 2023-11-24 DIAGNOSIS — F17210 Nicotine dependence, cigarettes, uncomplicated: Secondary | ICD-10-CM | POA: Diagnosis not present

## 2023-11-24 DIAGNOSIS — E039 Hypothyroidism, unspecified: Secondary | ICD-10-CM | POA: Diagnosis not present

## 2023-11-24 DIAGNOSIS — E66813 Obesity, class 3: Secondary | ICD-10-CM | POA: Diagnosis not present

## 2023-11-24 DIAGNOSIS — R7303 Prediabetes: Secondary | ICD-10-CM | POA: Diagnosis not present

## 2023-11-24 DIAGNOSIS — E063 Autoimmune thyroiditis: Secondary | ICD-10-CM | POA: Diagnosis not present

## 2023-11-24 DIAGNOSIS — Z6841 Body Mass Index (BMI) 40.0 and over, adult: Secondary | ICD-10-CM | POA: Diagnosis not present

## 2023-12-09 ENCOUNTER — Other Ambulatory Visit: Payer: Self-pay | Admitting: Rheumatology

## 2023-12-09 ENCOUNTER — Other Ambulatory Visit (HOSPITAL_COMMUNITY): Payer: Self-pay

## 2023-12-09 DIAGNOSIS — M0579 Rheumatoid arthritis with rheumatoid factor of multiple sites without organ or systems involvement: Secondary | ICD-10-CM

## 2023-12-09 NOTE — Telephone Encounter (Signed)
 Last Fill: 09/07/2023  Labs: 07/27/2023 CBC normal, vitamin D  31 which is in the desirable range.  Sed rate 29 which is mildly elevated indicating inflammation.  Hemoglobin A1c slightly elevated at 5.7.  Triglycerides and LDL are elevated.  CMP shows mildly elevated LFTs.  Please forward results to her PCP.   TB Gold: 01/18/2023 TB Gold Negative    Next Visit: Due around 09/26/2023 . Message sent to the front to schedule.  Last Visit: 07/27/2023  DX: Rheumatoid arthritis involving multiple sites with positive rheumatoid factor   Current Dose per office note 07/27/2023: Rinvoq  15 mg 1 tablet by mouth daily.   Okay to refill Rinvoq ?   LMOM for patient to update labs as well as call the office for a follow up appointment

## 2023-12-09 NOTE — Telephone Encounter (Signed)
 Please schedule patient a follow up visit. Patient due 09/26/2023 . Thanks!

## 2023-12-11 MED ORDER — RINVOQ 15 MG PO TB24
1.0000 | ORAL_TABLET | Freq: Every day | ORAL | 0 refills | Status: DC
  Filled 2023-12-12 – 2024-01-09 (×3): qty 30, 30d supply, fill #0

## 2023-12-12 ENCOUNTER — Other Ambulatory Visit: Payer: Self-pay

## 2023-12-13 ENCOUNTER — Other Ambulatory Visit: Payer: Self-pay

## 2023-12-15 ENCOUNTER — Other Ambulatory Visit: Payer: Self-pay

## 2023-12-21 ENCOUNTER — Other Ambulatory Visit (HOSPITAL_BASED_OUTPATIENT_CLINIC_OR_DEPARTMENT_OTHER): Payer: Self-pay

## 2023-12-21 DIAGNOSIS — E559 Vitamin D deficiency, unspecified: Secondary | ICD-10-CM | POA: Diagnosis not present

## 2023-12-21 DIAGNOSIS — Z6841 Body Mass Index (BMI) 40.0 and over, adult: Secondary | ICD-10-CM | POA: Diagnosis not present

## 2023-12-21 DIAGNOSIS — R110 Nausea: Secondary | ICD-10-CM | POA: Diagnosis not present

## 2023-12-21 DIAGNOSIS — R7303 Prediabetes: Secondary | ICD-10-CM | POA: Diagnosis not present

## 2023-12-21 DIAGNOSIS — F17210 Nicotine dependence, cigarettes, uncomplicated: Secondary | ICD-10-CM | POA: Diagnosis not present

## 2023-12-21 DIAGNOSIS — G4733 Obstructive sleep apnea (adult) (pediatric): Secondary | ICD-10-CM | POA: Diagnosis not present

## 2023-12-21 DIAGNOSIS — E66813 Obesity, class 3: Secondary | ICD-10-CM | POA: Diagnosis not present

## 2023-12-21 DIAGNOSIS — K2970 Gastritis, unspecified, without bleeding: Secondary | ICD-10-CM | POA: Diagnosis not present

## 2023-12-21 DIAGNOSIS — E039 Hypothyroidism, unspecified: Secondary | ICD-10-CM | POA: Diagnosis not present

## 2023-12-21 DIAGNOSIS — I10 Essential (primary) hypertension: Secondary | ICD-10-CM | POA: Diagnosis not present

## 2023-12-21 MED ORDER — ZEPBOUND 2.5 MG/0.5ML ~~LOC~~ SOAJ
2.5000 mg | SUBCUTANEOUS | 0 refills | Status: AC
  Filled 2023-12-21: qty 2, 28d supply, fill #0

## 2023-12-26 ENCOUNTER — Other Ambulatory Visit (HOSPITAL_COMMUNITY): Payer: Self-pay

## 2023-12-26 ENCOUNTER — Other Ambulatory Visit (HOSPITAL_BASED_OUTPATIENT_CLINIC_OR_DEPARTMENT_OTHER): Payer: Self-pay

## 2024-01-09 ENCOUNTER — Other Ambulatory Visit: Payer: Self-pay

## 2024-01-09 ENCOUNTER — Other Ambulatory Visit (HOSPITAL_COMMUNITY): Payer: Self-pay

## 2024-01-09 NOTE — Progress Notes (Signed)
 Specialty Pharmacy Refill Coordination Note  Lori Valentine is a 38 y.o. female contacted today regarding refills of specialty medication(s) Upadacitinib  (Rinvoq )   Patient requested Delivery   Delivery date: 01/12/24   Verified address: [address]   [place] [address]   Medication will be filled on 01/11/24.

## 2024-01-10 ENCOUNTER — Other Ambulatory Visit: Payer: Self-pay

## 2024-01-25 ENCOUNTER — Other Ambulatory Visit (HOSPITAL_COMMUNITY): Payer: Self-pay

## 2024-01-25 ENCOUNTER — Other Ambulatory Visit: Payer: Self-pay

## 2024-01-26 ENCOUNTER — Other Ambulatory Visit: Payer: Self-pay

## 2024-01-26 ENCOUNTER — Other Ambulatory Visit (HOSPITAL_COMMUNITY): Payer: Self-pay

## 2024-01-26 DIAGNOSIS — Z79899 Other long term (current) drug therapy: Secondary | ICD-10-CM

## 2024-01-27 LAB — LAB REPORT - SCANNED
A1c: 5.8
EGFR: 115

## 2024-01-28 ENCOUNTER — Other Ambulatory Visit (HOSPITAL_COMMUNITY): Payer: Self-pay

## 2024-01-30 ENCOUNTER — Other Ambulatory Visit: Payer: Self-pay

## 2024-02-02 ENCOUNTER — Other Ambulatory Visit: Payer: Self-pay

## 2024-02-02 ENCOUNTER — Other Ambulatory Visit (HOSPITAL_COMMUNITY): Payer: Self-pay

## 2024-02-02 ENCOUNTER — Other Ambulatory Visit: Payer: Self-pay | Admitting: Physician Assistant

## 2024-02-02 DIAGNOSIS — M0579 Rheumatoid arthritis with rheumatoid factor of multiple sites without organ or systems involvement: Secondary | ICD-10-CM

## 2024-02-02 DIAGNOSIS — Z111 Encounter for screening for respiratory tuberculosis: Secondary | ICD-10-CM

## 2024-02-02 MED ORDER — RINVOQ 15 MG PO TB24
1.0000 | ORAL_TABLET | Freq: Every day | ORAL | 0 refills | Status: DC
  Filled 2024-02-02 – 2024-03-09 (×4): qty 30, 30d supply, fill #0

## 2024-02-02 NOTE — Telephone Encounter (Signed)
 Last Fill: 12/11/2023  Labs: 07/27/2023 CBC normal, vitamin D  31 which is in the desirable range.  Sed rate 29 which is mildly elevated indicating inflammation.  Hemoglobin A1c slightly elevated at 5.7.  Triglycerides and LDL are elevated.  CMP shows mildly elevated LFTs.  Please forward results to her PCP.   (Waiting on new labs recently drawn at [facility] to be faxed over)  TB Gold: 01/18/2023 TB gold negative  Next Visit: Due 09/26/2023. Message sent to the front to schedule.   Last Visit: 07/27/2023  IK:Myzlfjunpi arthritis involving multiple sites with positive rheumatoid factor (HCC)   Current Dose per office note 07/27/2023: Rinvoq  15 mg 1 tablet by mouth daily.   Contacted the patient and advised TB Gold test is due. Patient states she will call the office to let us  know where to release the lab when she goes. Will pend future TB Gold for now.   Okay to refill Rinvoq ?

## 2024-02-02 NOTE — Telephone Encounter (Signed)
 Due 09/26/2023. Message sent to the front to schedule.

## 2024-02-03 ENCOUNTER — Other Ambulatory Visit: Payer: Self-pay

## 2024-02-06 ENCOUNTER — Telehealth: Payer: Self-pay

## 2024-02-06 ENCOUNTER — Other Ambulatory Visit (HOSPITAL_COMMUNITY): Payer: Self-pay

## 2024-02-06 ENCOUNTER — Other Ambulatory Visit: Payer: Self-pay

## 2024-02-06 NOTE — Telephone Encounter (Signed)
 Labs received from: PCP Gwen)  Drawn on: 01/27/2024  Reviewed by: Dr. Dolphus  Labs drawn:    BMP   Lipid panel w/ reflex   Hemoglobin A1c   CBC w/diff   ESR  Results: Calcium- corrected 8.50 Cholesterol 224 Calc LDL 164 Non-HDL 193 Chol/HDL ratio 7.3 A1c 5.8 MCV 20.0 NEUT% 42.9 LYMPH% 51.1 MONO# 0.2  Copy of labs sent to the scan center.

## 2024-02-08 ENCOUNTER — Other Ambulatory Visit: Payer: Self-pay

## 2024-03-09 ENCOUNTER — Other Ambulatory Visit (HOSPITAL_COMMUNITY): Payer: Self-pay

## 2024-03-09 ENCOUNTER — Other Ambulatory Visit: Payer: Self-pay

## 2024-03-09 ENCOUNTER — Other Ambulatory Visit: Payer: Self-pay | Admitting: *Deleted

## 2024-03-09 DIAGNOSIS — M0579 Rheumatoid arthritis with rheumatoid factor of multiple sites without organ or systems involvement: Secondary | ICD-10-CM

## 2024-03-09 MED ORDER — RINVOQ 15 MG PO TB24
1.0000 | ORAL_TABLET | Freq: Every day | ORAL | 0 refills | Status: AC
  Filled 2024-03-30 – 2024-04-23 (×3): qty 30, 30d supply, fill #0

## 2024-03-09 NOTE — Progress Notes (Signed)
 Specialty Pharmacy Refill Coordination Note  Lori Valentine is a 38 y.o. female contacted today regarding refills of specialty medication(s) Upadacitinib  (Rinvoq )   Patient requested Delivery   Delivery date: 03/12/24   Verified address: [address]   [place] [address]   Medication will be filled on: 03/09/24

## 2024-03-09 NOTE — Telephone Encounter (Signed)
 Patient left message requesting a refill on Rinvoq   Last Fill: 02/02/2024 (30 day supply)  Labs: 01/27/2024 Calcium- corrected 8.50 Cholesterol 224 Calc LDL 164 Non-HDL 193 Chol/HDL ratio 7.3 A1c 5.8 MCV 20.0 NEUT% 42.9 LYMPH% 51.1 MONO# 0.2    TB Gold: 01/18/2023 Neg  (Patient to update at upcoming appointment on 03/16/2024)  Next Visit: 03/16/2024  Last Visit: 07/27/2023  IK:Myzlfjunpi arthritis involving multiple sites with positive rheumatoid factor   Current Dose per office note 07/27/2023: Rinvoq  15 mg 1 tablet by mouth daily.   Okay to refill Rinvoq ?

## 2024-03-14 NOTE — Progress Notes (Unsigned)
 Office Visit Note  Patient: Lori Valentine             Date of Birth: [birth year]           MRN: [id number]             PCP: Dartha Geralds, DO Referring: Diona Perkins, MD Visit Date: 03/16/2024 Occupation: DENTAL ASSISTANT  Subjective:  Medication monitoring  History of Present Illness: Lori Valentine is a 38 y.o. female with history of rheumatoid arthritis.   Patient is currently taking Rinvoq  15 mg 1 tablet by mouth daily.  She is tolerating Rinvoq  without any side effects and has not had any recent gaps in therapy.  She denies any recent or recurrent infections.  Patient states that she is about to start her menstrual cycle which typically triggers a flare.  She is currently having increased pain in the left shoulder, left elbow, and left hand.  Patient has been taking ibuprofen  as needed for symptomatic relief.  She has been trying to avoid the use of prednisone  since she is been working on weight loss.  She is now on Zepbound . Patient decided against taking leflunomide  and would like to remain on rinvoq  as monotherapy for now.    Activities of Daily Living:  Patient reports morning stiffness for 0 minute.   Patient Denies nocturnal pain.  Difficulty dressing/grooming: Denies Difficulty climbing stairs: Denies Difficulty getting out of chair: Denies Difficulty using hands for taps, buttons, cutlery, and/or writing: Reports  Review of Systems  Constitutional:  Positive for fatigue.  HENT:  Negative for mouth sores and mouth dryness.   Eyes:  Negative for dryness.  Respiratory:  Negative for shortness of breath.   Cardiovascular:  Negative for chest pain and palpitations.  Gastrointestinal:  Negative for blood in stool, constipation and diarrhea.  Endocrine: Negative for increased urination.  Genitourinary:  Negative for involuntary urination.  Musculoskeletal:  Positive for joint pain, joint pain and joint swelling. Negative for gait problem, myalgias, muscle weakness, morning  stiffness, muscle tenderness and myalgias.  Skin:  Positive for sensitivity to sunlight. Negative for color change, rash and hair loss.  Allergic/Immunologic: Positive for susceptible to infections.  Neurological:  Negative for dizziness and headaches.  Hematological:  Negative for swollen glands.  Psychiatric/Behavioral:  Positive for sleep disturbance. Negative for depressed mood. The patient is nervous/anxious.     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   History of anxiety and depression 10/22/2016   Smoker 10/22/2016   Rheumatoid arthritis (HCC) 05/13/2015   Hypothyroidism 07/29/2014    Past Medical History:  Diagnosis Date   Hypertension    Hypothyroid    Rheumatoid arthritis (HCC)    Twin gestation, 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        Multiple Gestation       MC/DA - O30.039  Concordant (< 20%), nml AFV, AGA    Discordant (>20%)**  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              DC/DA - O30.049  Concordant (<20%), nml AFV, AGA, no other comorbidities  Discordant (> 20%)**    O30.003 is extra code for discordant growth  Q 3 wks 16-32, Q 2 wks 32-delivery Q 1 wk, Fluid alternating w/ growth   20-24-28-32-36 Q 2-3 wks     Family History  Problem Relation Age of Onset   Schizophrenia Mother    Bipolar disorder Mother    Diabetes Other    Heart disease Other    Diabetes Sister    Healthy Son    Past Surgical History:  Procedure Laterality Date   CESAREAN SECTION N/A 08/04/2017   Procedure: CESAREAN SECTION;  Surgeon: Lorence Ozell CROME, MD;  Location: [facility];  Service: Obstetrics;   Laterality: N/A;   TUBAL LIGATION Bilateral 08/04/2017   Procedure: BILATERAL TUBAL LIGATION;  Surgeon: Lorence Ozell CROME, MD;  Location: [facility] BIRTHING SUITES;  Service: Obstetrics;  Laterality: Bilateral;   Social History   Tobacco Use   Smoking status: Some Days    Current packs/day: 0.50    Average packs/day: 0.5 packs/day for 10.0 years (5.0 ttl pk-yrs)    Types: Cigarettes    Passive exposure: Never   Smokeless tobacco: Never  Vaping Use   Vaping status: Former  Substance Use Topics   Alcohol use: Yes    Comment: rarely   Drug use: No   Social History   Social History Narrative   58 year old son   Lives with  Sister and nephew and son.   Works as sales executive at [employer] History  Administered Date(s) Administered   Influenza,inj,Quad PF,6+ Mos 05/13/2015, 12/29/2016   Influenza-Unspecified 01/12/2018   Moderna Sars-Covid-2 Vaccination 05/28/2019, 07/03/2019   Tdap 05/13/2015, 06/15/2017     Objective: Vital Signs: BP 139/86   Pulse 85   Temp 97.8 F (36.6 C)   Resp 16   Ht 5' 7 (1.702 m)   Wt (!) 315 lb (142.9 kg)   LMP 02/16/2024   BMI 49.34 kg/m    Physical Exam Vitals and nursing note reviewed.  Constitutional:      Appearance: She is well-developed.  HENT:     Head: Normocephalic and atraumatic.  Eyes:     Conjunctiva/sclera: Conjunctivae normal.  Cardiovascular:     Rate and Rhythm: Normal rate and regular rhythm.     Heart sounds: Normal heart sounds.  Pulmonary:     Effort: Pulmonary effort is normal.     Breath sounds: Normal breath sounds.  Abdominal:     General: Bowel sounds are normal.     Palpations: Abdomen is soft.  Musculoskeletal:     Cervical back: Normal range of motion.  Lymphadenopathy:     Cervical: No cervical adenopathy.  Skin:    General: Skin is warm and dry.     Capillary Refill: Capillary refill takes less than 2 seconds.  Neurological:     Mental Status: She is alert and oriented to  person, place, and time.  Psychiatric:        Behavior: Behavior normal.      Musculoskeletal Exam: Patient remained seated during examination today.  C-spine has good range of motion.  Right shoulder has full abduction.  Left shoulder has discomfort with abduction and forward flexion.  Some tenderness along the left elbow joint line.  Rheumatoid nodule noted on the extensor surface of the right elbow.  Synovial thickening and limited range of motion of both wrists, right worse than left.  Tenderness and synovitis of several MCP and PIP joints as described below.  Hip joint stable to assess in seated position.  Knee joints have  good range of motion with no warmth or effusion.  Tenderness of both ankles with synovial thickening.  Warmth and swelling of the left ankle noted.  CDAI Exam: CDAI Score: -- Patient Global: --; Provider Global: -- Swollen: 4 ; Tender: 9  Joint Exam 03/16/2024      Right  Left  Glenohumeral      Tender  Elbow      Tender  Wrist      Tender  MCP 3     Swollen Tender  PIP 2 (finger)  Swollen Tender  Swollen Tender  PIP 3 (finger)     Swollen Tender  Ankle   Tender   Tender     Investigation: No additional findings.  Imaging: No results found.  Recent Labs: Lab Results  Component Value Date   WBC 5.3 07/27/2023   HGB 12.4 07/27/2023   PLT 282 07/27/2023   NA 140 07/27/2023   K 4.0 07/27/2023   CL 105 07/27/2023   CO2 27 07/27/2023   GLUCOSE 104 (H) 07/27/2023   BUN 15 07/27/2023   CREATININE 0.59 07/27/2023   BILITOT 0.2 07/27/2023   ALKPHOS 253 (H) 08/04/2017   AST 22 07/27/2023   ALT 37 (H) 07/27/2023   PROT 6.7 07/27/2023   ALBUMIN 2.5 (L) 08/04/2017   CALCIUM 8.6 07/27/2023   GFRAA 132 06/16/2020   QFTBGOLD Negative 07/07/2016   QFTBGOLDPLUS NEGATIVE 01/18/2023    Speciality Comments: PPD skin test: 02/01/2020 negative.   Prior therapy: Enbrel, Cimzia , Remicade , Plaquenil , and MTX monotherapy  Procedures:  No procedures  performed Allergies: Venlafaxine    Assessment / Plan:     Visit Diagnoses: Rheumatoid arthritis involving multiple sites with positive rheumatoid factor (HCC) - Prior therapy: Enbrel, Cimzia , Remicade , Plaquenil , and MTX monotherapy: Patient is currently taking Rinvoq  15 mg 1 tablet by mouth daily.  She is tolerating Rinvoq  without any side effects and has not had any major gaps in therapy recently.  She has not had any recent or recurrent infections.  Patient decided against taking leflunomide  after the last office visit due to the concern for immunosuppression. Patient is currently experiencing a flare affecting the left upper extremity which she attributes to being about to start her menstrual cycle.  She has tenderness and synovitis affecting the left third MCP and PIP joint and the left second PIP joint.  She also has tenderness and slightly limited extension of the left elbow as well as tenderness of the left wrist.  She has been taking ibuprofen  as needed for symptomatic relief.  She has declined a prednisone  taper at this time.  She was curious about switching from her invoke back to IV Remicade  in the future.  Discussed that the use of IV Remicade  would require being on methotrexate  or leflunomide  as combination therapy to prevent autoantibody formation.  She would like to remain on Rinvoq  for now.  She was advised to notify us  if she develop recurrent flares.  She will follow up in 5 months or sooner if needed.   High risk medication use - Rinvoq  15 mg 1 tablet by mouth daily. BMP, CBC, and lipid panel updated on 01/27/24.  Plan to update CBC and CMP today.  She will continue to require updated lab work every 3 months. TB gold negative on 01/18/23.  Order for TB gold released.   Previous therapy: Enbrel, Cimzia , Remicade , Plaquenil , methotrexate .  She has decided against taking leflunomide .  - Plan: QuantiFERON-TB Gold Plus, CBC with Differential/Platelet, Comprehensive metabolic panel with  GFR  Screening for tuberculosis -Order for TB gold released today.  Plan: QuantiFERON-TB Gold Plus  Acute pain of both shoulders: Patient is experiencing some increased discomfort in the left shoulder.  She has good range of motion on examination with some discomfort with full abduction. Right shoulder is doing well and has full range of motion today.  Pain of both elbows: Patient has tenderness along the left elbow joint line.  Slightly limited extension of the left elbow noted today.  She is been taking ibuprofen  as needed for symptomatic relief.  Pain in both hands: She presents today experiencing a flare affecting the left hand.  She has tenderness and synovitis of the left third MCP and PIP joint and the left second PIP joint. She is taking ibuprofen  as needed for symptomatic relief.  She attributes the flare to being about to start her menstrual cycle.  She has remained on Rinvoq  15 mg 1 tablet daily without interruption.  She has decided against initiating leflunomide  and would like to remain on Rinvoq  as monotherapy.  She will notify us  if she continues to have recurrent flares.  Chronic pain of both ankles: She has difficulty walking prolonged distances due to the discomfort in both ankles. Tenderness of both ankles noted. She uses compression socks as needed.  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  Other medical conditions are listed as follows:  Costochondritis: Not currently symptomatic.  Elevated LDL cholesterol level  Anxiety and depression  Recurrent oral herpes simplex  History of hypothyroidism  Vitamin D  deficiency    Orders: Orders Placed This Encounter  Procedures   QuantiFERON-TB Gold Plus   CBC with Differential/Platelet   Comprehensive metabolic panel with GFR   No orders of the defined types were placed in this encounter.    Follow-Up Instructions: Return in about 5 months (around 08/14/2024) for  Rheumatoid arthritis.   Waddell CHRISTELLA Craze, PA-C  Note - This record has been created using Dragon software.  Chart creation errors have been sought, but may not always  have been located. Such creation errors do not reflect on  the standard of medical care.

## 2024-03-16 ENCOUNTER — Ambulatory Visit: Attending: Physician Assistant | Admitting: Physician Assistant

## 2024-03-16 ENCOUNTER — Encounter: Payer: Self-pay | Admitting: Physician Assistant

## 2024-03-16 VITALS — BP 139/86 | HR 85 | Temp 97.8°F | Resp 16 | Ht 67.0 in | Wt 315.0 lb

## 2024-03-16 DIAGNOSIS — M79641 Pain in right hand: Secondary | ICD-10-CM

## 2024-03-16 DIAGNOSIS — M25572 Pain in left ankle and joints of left foot: Secondary | ICD-10-CM

## 2024-03-16 DIAGNOSIS — F419 Anxiety disorder, unspecified: Secondary | ICD-10-CM | POA: Diagnosis not present

## 2024-03-16 DIAGNOSIS — M0579 Rheumatoid arthritis with rheumatoid factor of multiple sites without organ or systems involvement: Secondary | ICD-10-CM | POA: Diagnosis not present

## 2024-03-16 DIAGNOSIS — M25521 Pain in right elbow: Secondary | ICD-10-CM | POA: Diagnosis not present

## 2024-03-16 DIAGNOSIS — G8929 Other chronic pain: Secondary | ICD-10-CM

## 2024-03-16 DIAGNOSIS — M25522 Pain in left elbow: Secondary | ICD-10-CM

## 2024-03-16 DIAGNOSIS — M940 Chondrocostal junction syndrome [Tietze]: Secondary | ICD-10-CM

## 2024-03-16 DIAGNOSIS — M25512 Pain in left shoulder: Secondary | ICD-10-CM

## 2024-03-16 DIAGNOSIS — M25511 Pain in right shoulder: Secondary | ICD-10-CM | POA: Diagnosis not present

## 2024-03-16 DIAGNOSIS — Z79899 Other long term (current) drug therapy: Secondary | ICD-10-CM

## 2024-03-16 DIAGNOSIS — M0630 Rheumatoid nodule, unspecified site: Secondary | ICD-10-CM | POA: Diagnosis not present

## 2024-03-16 DIAGNOSIS — E559 Vitamin D deficiency, unspecified: Secondary | ICD-10-CM

## 2024-03-16 DIAGNOSIS — M79642 Pain in left hand: Secondary | ICD-10-CM

## 2024-03-16 DIAGNOSIS — M25571 Pain in right ankle and joints of right foot: Secondary | ICD-10-CM

## 2024-03-16 DIAGNOSIS — Z111 Encounter for screening for respiratory tuberculosis: Secondary | ICD-10-CM

## 2024-03-16 DIAGNOSIS — E7800 Pure hypercholesterolemia, unspecified: Secondary | ICD-10-CM | POA: Diagnosis not present

## 2024-03-16 DIAGNOSIS — F32A Depression, unspecified: Secondary | ICD-10-CM

## 2024-03-16 DIAGNOSIS — B002 Herpesviral gingivostomatitis and pharyngotonsillitis: Secondary | ICD-10-CM

## 2024-03-16 DIAGNOSIS — Z8639 Personal history of other endocrine, nutritional and metabolic disease: Secondary | ICD-10-CM

## 2024-03-19 ENCOUNTER — Ambulatory Visit: Payer: Self-pay | Admitting: Physician Assistant

## 2024-03-19 LAB — COMPREHENSIVE METABOLIC PANEL WITH GFR
AG Ratio: 1.5 (calc) (ref 1.0–2.5)
ALT: 27 U/L (ref 6–29)
AST: 19 U/L (ref 10–30)
Albumin: 4.1 g/dL (ref 3.6–5.1)
Alkaline phosphatase (APISO): 105 U/L (ref 31–125)
BUN: 16 mg/dL (ref 7–25)
CO2: 26 mmol/L (ref 20–32)
Calcium: 8.8 mg/dL (ref 8.6–10.2)
Chloride: 106 mmol/L (ref 98–110)
Creat: 0.67 mg/dL (ref 0.50–0.97)
Globulin: 2.7 g/dL (ref 1.9–3.7)
Glucose, Bld: 111 mg/dL — ABNORMAL HIGH (ref 65–99)
Potassium: 4.4 mmol/L (ref 3.5–5.3)
Sodium: 139 mmol/L (ref 135–146)
Total Bilirubin: 0.2 mg/dL (ref 0.2–1.2)
Total Protein: 6.8 g/dL (ref 6.1–8.1)
eGFR: 115 mL/min/1.73m2 (ref >=60)

## 2024-03-19 LAB — CBC WITH DIFFERENTIAL/PLATELET
Absolute Lymphocytes: 1820 {cells}/uL (ref 850–3900)
Absolute Monocytes: 347 {cells}/uL (ref 200–950)
Basophils Absolute: 11 {cells}/uL (ref 0–200)
Basophils Relative: 0.2 %
Eosinophils Absolute: 39 {cells}/uL (ref 15–500)
Eosinophils Relative: 0.7 %
HCT: 38.4 % (ref 35.9–46.0)
Hemoglobin: 12.3 g/dL (ref 11.7–15.5)
MCH: 27.2 pg (ref 27.0–33.0)
MCHC: 32 g/dL (ref 31.6–35.4)
MCV: 84.8 fL (ref 81.4–101.7)
MPV: 9.4 fL (ref 7.5–12.5)
Monocytes Relative: 6.2 %
Neutro Abs: 3382 {cells}/uL (ref 1500–7800)
Neutrophils Relative %: 60.4 %
Platelets: 305 Thousand/uL (ref 140–400)
RBC: 4.53 Million/uL (ref 3.80–5.10)
RDW: 15.1 % — ABNORMAL HIGH (ref 11.0–15.0)
Total Lymphocyte: 32.5 %
WBC: 5.6 Thousand/uL (ref 3.8–10.8)

## 2024-03-19 LAB — QUANTIFERON-TB GOLD PLUS
Mitogen-NIL: 7.81 [IU]/mL
NIL: 0.01 [IU]/mL
QuantiFERON-TB Gold Plus: NEGATIVE
TB1-NIL: 0 [IU]/mL
TB2-NIL: 0 [IU]/mL

## 2024-03-19 NOTE — Progress Notes (Signed)
 Glucose is 111, rest of CMP WNL. TB gold negative  CBC stable.

## 2024-03-20 ENCOUNTER — Telehealth: Payer: Self-pay | Admitting: Rheumatology

## 2024-03-20 NOTE — Telephone Encounter (Signed)
 Called patient and advised of lab results. Patient verbalized understanding.

## 2024-03-20 NOTE — Telephone Encounter (Signed)
 Pt called stating she could not answer the phone for lab results and would like them to be left on her cell phone machine.

## 2024-03-30 ENCOUNTER — Other Ambulatory Visit: Payer: Self-pay

## 2024-04-02 ENCOUNTER — Other Ambulatory Visit: Payer: Self-pay

## 2024-04-04 ENCOUNTER — Other Ambulatory Visit (HOSPITAL_COMMUNITY): Payer: Self-pay

## 2024-04-06 ENCOUNTER — Other Ambulatory Visit: Payer: Self-pay

## 2024-04-23 ENCOUNTER — Other Ambulatory Visit: Payer: Self-pay | Admitting: Pharmacy Technician

## 2024-04-23 ENCOUNTER — Other Ambulatory Visit: Payer: Self-pay

## 2024-04-23 NOTE — Progress Notes (Signed)
 Specialty Pharmacy Refill Coordination Note  Lori Valentine is a 39 y.o. female contacted today regarding refills of specialty medication(s) Upadacitinib  (Rinvoq )   Patient requested Delivery   Delivery date: 04/24/24   Verified address: [address], [address]   [place] [address]   Medication will be filled on: 04/23/24

## 2024-04-30 ENCOUNTER — Other Ambulatory Visit (HOSPITAL_BASED_OUTPATIENT_CLINIC_OR_DEPARTMENT_OTHER): Payer: Self-pay

## 2024-04-30 MED ORDER — ZEPBOUND 10 MG/0.5ML ~~LOC~~ SOAJ
10.0000 mg | SUBCUTANEOUS | 1 refills | Status: AC
  Filled 2024-04-30 (×2): qty 2, 28d supply, fill #0
